# Patient Record
Sex: Female | Born: 1985 | Race: Black or African American | Hispanic: No | Marital: Single | Smoking: Never smoker
Health system: Southern US, Community
[De-identification: ages and names within clinical notes are randomized; demographics above are authoritative.]

## PROBLEM LIST (undated history)

## (undated) ENCOUNTER — Inpatient Hospital Stay (HOSPITAL_COMMUNITY): Payer: Self-pay

## (undated) DIAGNOSIS — D649 Anemia, unspecified: Secondary | ICD-10-CM

## (undated) DIAGNOSIS — Z789 Other specified health status: Secondary | ICD-10-CM

## (undated) DIAGNOSIS — Z9289 Personal history of other medical treatment: Secondary | ICD-10-CM

## (undated) DIAGNOSIS — N189 Chronic kidney disease, unspecified: Secondary | ICD-10-CM

## (undated) DIAGNOSIS — I5042 Chronic combined systolic (congestive) and diastolic (congestive) heart failure: Secondary | ICD-10-CM

## (undated) HISTORY — DX: Chronic combined systolic (congestive) and diastolic (congestive) heart failure: I50.42

## (undated) HISTORY — PX: NO PAST SURGERIES: SHX2092

---
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
## 2011-04-24 ENCOUNTER — Emergency Department (HOSPITAL_COMMUNITY)
Admission: EM | Admit: 2011-04-24 | Discharge: 2011-04-24 | Disposition: A | Discharge status: Home Health | Payer: Self-pay | Source: Home / Self Care | Attending: Emergency Medicine | Admitting: Emergency Medicine

## 2011-04-24 ENCOUNTER — Encounter (HOSPITAL_COMMUNITY): Payer: Self-pay | Admitting: Emergency Medicine

## 2011-04-24 DIAGNOSIS — N939 Abnormal uterine and vaginal bleeding, unspecified: Secondary | ICD-10-CM

## 2011-04-24 DIAGNOSIS — N898 Other specified noninflammatory disorders of vagina: Secondary | ICD-10-CM

## 2011-04-24 LAB — POCT URINALYSIS DIP (DEVICE)
Bilirubin Urine: NEGATIVE
Glucose, UA: NEGATIVE mg/dL
Hgb urine dipstick: NEGATIVE
Nitrite: NEGATIVE
Specific Gravity, Urine: <=1.005 (ref 1.005–1.030)
Urobilinogen, UA: 0.2 mg/dL (ref 0.0–1.0)
pH: 6.5 (ref 5.0–8.0)

## 2011-04-24 NOTE — ED Notes (Signed)
Delay in primary nurse assessment and treatment secondary to department acuity.

## 2011-04-24 NOTE — ED Provider Notes (Signed)
History     CSN: [id number]  Arrival date & time 04/24/11  4098   First MD Initiated Contact with Patient 04/24/11 [phone]      Chief Complaint  Patient presents with  . Vaginal Bleeding    (Consider location/radiation/quality/duration/timing/severity/associated sxs/prior treatment) HPI Comments: Been spotting on and off for two days my next period is not due yet, Im very regular my last period was 02-19, usually last  Days, not like this spotting" " last time was yesterday, No pain  Patient is a 26 y.o. female presenting with vaginal bleeding. The history is provided by the patient.  Vaginal Bleeding This is a new problem. The current episode started more than 2 days ago. The problem occurs rarely. The problem has been gradually improving. Pertinent negatives include no abdominal pain. She has tried nothing for the symptoms.    History reviewed. No pertinent past medical history.  History reviewed. No pertinent past surgical history.  No family history on file.  History  Substance Use Topics  . Smoking status: Never Smoker   . Smokeless tobacco: Not on file  . Alcohol Use: No    OB History    Grav Para Term Preterm Abortions TAB SAB Ect Mult Living                  Review of Systems  Constitutional: Negative for fever and appetite change.  Gastrointestinal: Negative for abdominal pain.  Genitourinary: Positive for vaginal bleeding and menstrual problem. Negative for dysuria, frequency, flank pain and vaginal pain.    Allergies  Review of patient's allergies indicates no known allergies.  Home Medications  No current outpatient prescriptions on file.  BP 125/71  Pulse 76  Temp(Src) 98.2 F (36.8 C) (Oral)  Resp 16  SpO2 100%  LMP 04/03/2011  Physical Exam  Nursing note and vitals reviewed. Constitutional: She appears well-developed and well-nourished.  Eyes: Conjunctivae are normal.  Genitourinary: Vagina normal. Uterus is not enlarged and not tender.  Cervix exhibits no motion tenderness and no discharge. Right adnexum displays no mass, no tenderness and no fullness. Left adnexum displays no mass, no tenderness and no fullness. No erythema, tenderness or bleeding around the vagina. No foreign body around the vagina. No vaginal discharge found.  Skin: No rash noted.    ED Course  Procedures (including critical care time)  Labs Reviewed  POCT URINALYSIS DIP (DEVICE) - Abnormal; Notable for the following:    Leukocytes, UA SMALL (*) Biochemical Testing Only. Please order routine urinalysis from main lab if confirmatory testing is needed.   All other components within normal limits  POCT PREGNANCY, URINE  GC/CHLAMYDIA PROBE AMP, GENITAL   No results found.   1. Vaginal bleeding, abnormal       MDM  Mild intermenstrual bleeding, with negative pregnancy test, and no pelvic pain        Jimmie Molly, MD 04/24/11 2047

## 2011-04-24 NOTE — ED Notes (Signed)
patientinstructed to undress and place on gown for physician evaluation.  Equipment at bedside

## 2011-04-24 NOTE — ED Notes (Signed)
Patient reports vaginal bleeding , onset 3/10.  Denies pain.  Last period was 2/19.  This episode of bleeding is "spotting" per patient.  No bleeding today.  Reports spotting yesterday am.

## 2011-04-24 NOTE — Discharge Instructions (Signed)
As discussed need to followup with the [facility] health Department GYN clinic.    Dysfunctional Uterine Bleeding Normally, menstrual periods begin between ages 47 to 22 in young women. A normal menstrual cycle/period may begin every 23 days up to 35 days and lasts from 1 to 7 days. Around 12 to 14 days before your menstrual period starts, ovulation (ovary produces an egg) occurs. When counting the time between menstrual periods, count from the first day of bleeding of the previous period to the first day of bleeding of the next period. Dysfunctional (abnormal) uterine bleeding is bleeding that is different from a normal menstrual period. Your periods may come earlier or later than usual. They may be lighter, have blood clots or be heavier. You may have bleeding between periods, or you may skip one period or more. You may have bleeding after sexual intercourse, bleeding after menopause, or no menstrual period. CAUSES   Pregnancy (normal, miscarriage, tubal).   IUDs (intrauterine device, birth control).   Birth control pills.   Hormone treatment.   Menopause.   Infection of the cervix.   Blood clotting problems.   Infection of the inside lining of the uterus.   Endometriosis, inside lining of the uterus growing in the pelvis and other female organs.   Adhesions (scar tissue) inside the uterus.   Obesity or severe weight loss.   Uterine polyps inside the uterus.   Cancer of the vagina, cervix, or uterus.   Ovarian cysts or polycystic ovary syndrome.   Medical problems (diabetes, thyroid disease).   Uterine fibroids (noncancerous tumor).   Problems with your female hormones.   Endometrial hyperplasia, very thick lining and enlarged cells inside of the uterus.   Medicines that interfere with ovulation.   Radiation to the pelvis or abdomen.   Chemotherapy.  DIAGNOSIS   Your doctor will discuss the history of your menstrual periods, medicines you are taking, changes in  your weight, stress in your life, and any medical problems you may have.   Your doctor will do a physical and pelvic examination.   Your doctor may want to perform certain tests to make a diagnosis, such as:   Pap test.   Blood tests.   Cultures for infection.   CT scan.   Ultrasound.   Hysteroscopy.   Laparoscopy.   MRI.   Hysterosalpingography.   D and C.   Endometrial biopsy.  TREATMENT  Treatment will depend on the cause of the dysfunctional uterine bleeding (DUB). Treatment may include:  Observing your menstrual periods for a couple of months.   Prescribing medicines for medical problems, including:   Antibiotics.   Hormones.   Birth control pills.   Removing an IUD (intrauterine device, birth control).   Surgery:   D and C (scrape and remove tissue from inside the uterus).   Laparoscopy (examine inside the abdomen with a lighted tube).   Uterine ablation (destroy lining of the uterus with electrical current, laser, heat, or freezing).   Hysteroscopy (examine cervix and uterus with a lighted tube).   Hysterectomy (remove the uterus).  HOME CARE INSTRUCTIONS   If medicines were prescribed, take exactly as directed. Do not change or switch medicines without consulting your caregiver.   Long term heavy bleeding may result in iron deficiency. Your caregiver may have prescribed iron pills. They help replace the iron that your body lost from heavy bleeding. Take exactly as directed.   Do not take aspirin or medicines that contain aspirin one week before  or during your menstrual period. Aspirin may make the bleeding worse.   If you need to change your sanitary pad or tampon more than once every 2 hours, stay in bed with your feet elevated and a cold pack on your lower abdomen. Rest as much as possible, until the bleeding stops or slows down.   Eat well-balanced meals. Eat foods high in iron. Examples are:   Leafy green vegetables.   Whole-grain breads  and cereals.   Eggs.   Meat.   Liver.   Do not try to lose weight until the abnormal bleeding has stopped and your blood iron level is back to normal. Do not lift more than ten pounds or do strenuous activities when you are bleeding.   For a couple of months, make note on your calendar, marking the start and ending of your period, and the type of bleeding (light, medium, heavy, spotting, clots or missed periods). This is for your caregiver to better evaluate your problem.  SEEK MEDICAL CARE IF:   You develop nausea (feeling sick to your stomach) and vomiting, dizziness, or diarrhea while you are taking your medicine.   You are getting lightheaded or weak.   You have any problems that may be related to the medicine you are taking.   You develop pain with your DUB.   You want to remove your IUD.   You want to stop or change your birth control pills or hormones.   You have any type of abnormal bleeding mentioned above.   You are over 76 years old and have not had a menstrual period yet.   You are 26 years old and you are still having menstrual periods.   You have any of the symptoms mentioned above.   You develop a rash.  SEEK IMMEDIATE MEDICAL CARE IF:   An oral temperature above 102 F (38.9 C) develops.   You develop chills.   You are changing your sanitary pad or tampon more than once an hour.   You develop abdominal pain.   You pass out or faint.  Document Released: 01/27/2000 Document Revised: 01/18/2011 Document Reviewed: 12/28/2008 [facility] Patient Information 2012 [place], [place].

## 2011-04-24 NOTE — ED Notes (Signed)
Patient in restroom obtaining specimen

## 2011-04-25 LAB — GC/CHLAMYDIA PROBE AMP, GENITAL
Chlamydia, DNA Probe: NEGATIVE
GC Probe Amp, Genital: NEGATIVE

## 2013-06-05 ENCOUNTER — Emergency Department (HOSPITAL_COMMUNITY)
Admission: EM | Admit: 2013-06-05 | Discharge: 2013-06-05 | Disposition: A | Discharge status: Home / Self Care | Payer: Self-pay | Attending: Emergency Medicine | Admitting: Emergency Medicine

## 2013-06-05 ENCOUNTER — Encounter (HOSPITAL_COMMUNITY): Payer: Self-pay | Admitting: Emergency Medicine

## 2013-06-05 DIAGNOSIS — J020 Streptococcal pharyngitis: Secondary | ICD-10-CM | POA: Insufficient documentation

## 2013-06-05 LAB — RAPID STREP SCREEN (MED CTR MEBANE ONLY): STREPTOCOCCUS, GROUP A SCREEN (DIRECT): POSITIVE — AB

## 2013-06-05 MED ORDER — PENICILLIN G BENZATHINE 1200000 UNIT/2ML IM SUSP
1.2000 10*6.[IU] | Freq: Once | INTRAMUSCULAR | Status: AC
  Administered 2013-06-05: 1.2 10*6.[IU] via INTRAMUSCULAR
  Filled 2013-06-05: qty 2

## 2013-06-05 NOTE — ED Notes (Signed)
Pt. reports throat swelling with mild pain onset yesterday , airway intact / respirations unlabored.

## 2013-06-05 NOTE — ED Provider Notes (Signed)
CSN: [id number]     Arrival date & time 06/05/13  2017 History  This chart was scribed for non-physician practitioner, Elpidio AnisShari Jeramine Delis, PA-C working with Audree CamelScott T Goldston, MD by Greggory StallionKayla Andersen, ED scribe. This patient was seen in room TR07C/TR07C and the patient's care was started at 8:44 PM.   Chief Complaint  Patient presents with  . Sore Throat   The history is provided by the patient. No language interpreter was used.   HPI Comments: Carolyn Lynn is a 28 y.o. female who presents to the Emergency Department complaining of gradual onset sore throat that started last night. Swallowing worsens the pain. Denies fever, congestion, rhinorrhea, cough.  History reviewed. No pertinent past medical history. History reviewed. No pertinent past surgical history. No family history on file. History  Substance Use Topics  . Smoking status: Never Smoker   . Smokeless tobacco: Not on file  . Alcohol Use: No   OB History   Grav Para Term Preterm Abortions TAB SAB Ect Mult Living                 Review of Systems  Constitutional: Negative for fever.  HENT: Positive for sore throat. Negative for congestion and rhinorrhea.   Respiratory: Negative for cough.   All other systems reviewed and are negative.  Allergies  Review of patient's allergies indicates no known allergies.  Home Medications   Prior to Admission medications   Not on File   BP 124/85  Pulse 96  Temp(Src) 98.6 F (37 C) (Oral)  Resp 18  SpO2 100%  LMP 05/19/2013  Physical Exam  Nursing note and vitals reviewed. Constitutional: She is oriented to person, place, and time. She appears well-developed and well-nourished. No distress.  HENT:  Head: Normocephalic and atraumatic.  Red, swollen tonsils bilaterally with exudate. Uvula midline.   Eyes: EOM are normal.  Neck: Neck supple. No tracheal deviation present.  Cardiovascular: Normal rate.   Pulmonary/Chest: Effort normal. No respiratory distress. She has no  wheezes. She has no rales.  Musculoskeletal: Normal range of motion.  Lymphadenopathy:    She has cervical adenopathy.       Right cervical: Superficial cervical adenopathy present.       Left cervical: Superficial cervical adenopathy present.  Neurological: She is alert and oriented to person, place, and time.  Skin: Skin is warm and dry.  Psychiatric: She has a normal mood and affect. Her behavior is normal.    ED Course  Procedures (including critical care time)  DIAGNOSTIC STUDIES: Oxygen Saturation is 100% on RA, normal by my interpretation.    COORDINATION OF CARE: 8:45 PM-Discussed treatment plan which includes rapid strep with pt at bedside and pt agreed to plan.   Labs Review Labs Reviewed  RAPID STREP SCREEN - Abnormal; Notable for the following:    Streptococcus, Group A Screen (Direct) POSITIVE (*)    All other components within normal limits    Imaging Review No results found.   EKG Interpretation None      MDM   Final diagnoses:  None    1. Strep throat  Patient well appearing, afebrile. Symptoms isolated to sore throat and there is no difficulty swallowing associated with it. Positive strep treated with Bicillin. Patient stable for discharge.   I personally performed the services described in this documentation, which was scribed in my presence. The recorded information has been reviewed and is accurate.  Arnoldo HookerShari A Ferne Ellingwood, PA-C 06/05/13 2150

## 2013-06-05 NOTE — Discharge Instructions (Signed)
Salt Water Gargle °This solution will help make your mouth and throat feel better. °HOME CARE INSTRUCTIONS  °· Mix 1 teaspoon of salt in 8 ounces of warm water. °· Gargle with this solution as much or often as you need or as directed. Swish and gargle gently if you have any sores or wounds in your mouth. °· Do not swallow this mixture. °Document Released: 11/03/2003 Document Revised: 04/23/2011 Document Reviewed: 03/26/2008 °ExitCare® Patient Information ©2014 [employer]. °Strep Throat °Strep throat is an infection of the throat caused by a bacteria named Streptococcus pyogenes. Your caregiver may call the infection streptococcal "tonsillitis" or "pharyngitis" depending on whether there are signs of inflammation in the tonsils or back of the throat. Strep throat is most common in children aged 5 15 years during the cold months of the year, but it can occur in people of any age during any season. This infection is spread from person to person (contagious) through coughing, sneezing, or other close contact. °SYMPTOMS  °· Fever or chills. °· Painful, swollen, red tonsils or throat. °· Pain or difficulty when swallowing. °· White or yellow spots on the tonsils or throat. °· Swollen, tender lymph nodes or "glands" of the neck or under the jaw. °· Red rash all over the body (rare). °DIAGNOSIS  °Many different infections can cause the same symptoms. A test must be done to confirm the diagnosis so the right treatment can be given. A "rapid strep test" can help your caregiver make the diagnosis in a few minutes. If this test is not available, a light swab of the infected area can be used for a throat culture test. If a throat culture test is done, results are usually available in a day or two. °TREATMENT  °Strep throat is treated with antibiotic medicine. °HOME CARE INSTRUCTIONS  °· Gargle with 1 tsp of salt in 1 cup of warm water, 3 4 times per day or as needed for comfort. °· Family members who also have a sore throat  or fever should be tested for strep throat and treated with antibiotics if they have the strep infection. °· Make sure everyone in your household washes their hands well. °· Do not share food, drinking cups, or personal items that could cause the infection to spread to others. °· You may need to eat a soft food diet until your sore throat gets better. °· Drink enough water and fluids to keep your urine clear or pale yellow. This will help prevent dehydration. °· Get plenty of rest. °· Stay home from school, daycare, or work until you have been on antibiotics for 24 hours. °· Only take over-the-counter or prescription medicines for pain, discomfort, or fever as directed by your caregiver. °· If antibiotics are prescribed, take them as directed. Finish them even if you start to feel better. °SEEK MEDICAL CARE IF:  °· The glands in your neck continue to enlarge. °· You develop a rash, cough, or earache. °· You cough up green, yellow-brown, or bloody sputum. °· You have pain or discomfort not controlled by medicines. °· Your problems seem to be getting worse rather than better. °SEEK IMMEDIATE MEDICAL CARE IF:  °· You develop any new symptoms such as vomiting, severe headache, stiff or painful neck, chest pain, shortness of breath, or trouble swallowing. °· You develop severe throat pain, drooling, or changes in your voice. °· You develop swelling of the neck, or the skin on the neck becomes red and tender. °· You have a fever. °·   You develop signs of dehydration, such as fatigue, dry mouth, and decreased urination. °· You become increasingly sleepy, or you cannot wake up completely. °Document Released: 01/27/2000 Document Revised: 01/16/2012 Document Reviewed: 03/30/2010 °ExitCare® Patient Information ©2014 [employer]. ° °

## 2013-06-08 NOTE — ED Provider Notes (Signed)
Medical screening examination/treatment/procedure(s) were performed by non-physician practitioner and as supervising physician I was immediately available for consultation/collaboration.   EKG Interpretation None        Audree CamelScott T Bailee Metter, MD 06/08/13 [phone]

## 2013-08-04 ENCOUNTER — Encounter (HOSPITAL_COMMUNITY): Payer: Self-pay | Admitting: Emergency Medicine

## 2013-08-04 ENCOUNTER — Emergency Department (HOSPITAL_COMMUNITY)
Admission: EM | Admit: 2013-08-04 | Discharge: 2013-08-04 | Disposition: A | Discharge status: Home / Self Care | Payer: Self-pay | Attending: Emergency Medicine | Admitting: Emergency Medicine

## 2013-08-04 DIAGNOSIS — K029 Dental caries, unspecified: Secondary | ICD-10-CM | POA: Insufficient documentation

## 2013-08-04 DIAGNOSIS — Z791 Long term (current) use of non-steroidal anti-inflammatories (NSAID): Secondary | ICD-10-CM | POA: Insufficient documentation

## 2013-08-04 MED ORDER — TRAMADOL HCL 50 MG PO TABS: 50.0000 mg | ORAL_TABLET | Freq: Four times a day (QID) | ORAL | Status: DC | PRN

## 2013-08-04 MED ORDER — NAPROXEN 500 MG PO TABS: 500.0000 mg | ORAL_TABLET | Freq: Two times a day (BID) | ORAL | Status: DC

## 2013-08-04 MED ORDER — PENICILLIN V POTASSIUM 500 MG PO TABS: 500.0000 mg | ORAL_TABLET | Freq: Four times a day (QID) | ORAL | Status: DC

## 2013-08-04 NOTE — ED Provider Notes (Signed)
CSN: [id number]     Arrival date & time 08/04/13  2138 History   First MD Initiated Contact with Patient 08/04/13 2201     This chart was scribed for non-physician practitioner, Junius FinnerErin O'Malley PA-C,working with Lyanne CoKevin M Campos, MD by Arlan OrganAshley Leger, ED Scribe. This patient was seen in room TR10C/TR10C and the patient's care was started at 10:45 PM.   Chief Complaint  Patient presents with  . Dental Pain  . Facial Pain   The history is provided by the patient. No language interpreter was used.    HPI Comments: Carolyn Lynn is a 28 y.o. female who presents to the Emergency Department complaining of constant, moderate L sided dental pain that radiates into the L side of face x 2 days. She also reports a HA onset yesterday. She has tried OTC Ibuprofen without any improvement. No fever, nausea, inability swallowing or vomiting. She denies any known allergies to medications. She has no pertinent past medical history. No other concerns this visit.  History reviewed. No pertinent past medical history. History reviewed. No pertinent past surgical history. History reviewed. No pertinent family history. History  Substance Use Topics  . Smoking status: Never Smoker   . Smokeless tobacco: Not on file  . Alcohol Use: No   OB History   Grav Para Term Preterm Abortions TAB SAB Ect Mult Living                 Review of Systems  Constitutional: Negative for fever and chills.  HENT: Positive for dental problem. Negative for congestion, ear pain and trouble swallowing.   Eyes: Negative for redness.  Respiratory: Negative for cough.   Skin: Negative for rash.  Psychiatric/Behavioral: Negative for confusion.      Allergies  Review of patient's allergies indicates no known allergies.  Home Medications   Prior to Admission medications   Medication Sig Start Date End Date Taking? Authorizing [name]  ibuprofen (ADVIL,MOTRIN) 200 MG tablet Take 400 mg by mouth every 6 (six) hours as needed.    Yes Historical Nyaisha Simao, MD  naproxen (NAPROSYN) 500 MG tablet Take 1 tablet (500 mg total) by mouth 2 (two) times daily. 08/04/13   Junius FinnerErin O'Malley, PA-C  penicillin v potassium (VEETID) 500 MG tablet Take 1 tablet (500 mg total) by mouth 4 (four) times daily. 08/04/13   Junius FinnerErin O'Malley, PA-C  traMADol (ULTRAM) 50 MG tablet Take 1 tablet (50 mg total) by mouth every 6 (six) hours as needed. 08/04/13   Junius FinnerErin O'Malley, PA-C   Triage Vitals: BP 129/85  Pulse 69  Temp(Src) 97.8 F (36.6 C) (Oral)  Resp 16  Ht 5\' 5"  (1.651 m)  Wt 125 lb 8 oz (56.926 kg)  BMI 20.88 kg/m2  SpO2 100%   Physical Exam  Nursing note and vitals reviewed. Constitutional: She is oriented to person, place, and time. She appears well-developed and well-nourished.  HENT:  Head: Normocephalic and atraumatic.  Diffuse dental caries without gingival abscess.  Tenderness along gingiva surrounding left upper teeth.  No discharge or bleeding.    Eyes: EOM are normal. Pupils are equal, round, and reactive to light.  Neck: Normal range of motion. Neck supple.  Cardiovascular: Normal rate.   Pulmonary/Chest: Effort normal.  Musculoskeletal: Normal range of motion.  Neurological: She is alert and oriented to person, place, and time. GCS eye subscore is 4. GCS verbal subscore is 5. GCS motor subscore is 6.  Skin: Skin is warm and dry.  Psychiatric: She has a normal  mood and affect. Her behavior is normal.    ED Course  Procedures (including critical care time)  DIAGNOSTIC STUDIES: Oxygen Saturation is 100% on RA, Normal by my interpretation.    COORDINATION OF CARE: 10:46 PM- Will give Tramadol, Naproxen, Penicillin, to help manage symptoms. Discussed treatment plan with pt at bedside and pt agreed to plan.     Labs Review Labs Reviewed - No data to display  Imaging Review No results found.   EKG Interpretation None      MDM   Final diagnoses:  Pain due to dental caries    Pt c/o dental pain radiating into left  side of face causing headache since yesterday. No relief with OTC pain meds.  No dental abscess on exam. Dental caries present.  Will tx with PCN, tramadol, and naproxen. Advised to f/u with dentist. Dental resource guide provided. Return precautions provided. Pt verbalized understanding and agreement with tx plan.   I personally performed the services described in this documentation, which was scribed in my presence. The recorded information has been reviewed and is accurate.    Junius Finnerrin O'Malley, PA-C 08/04/13 2326

## 2013-08-04 NOTE — Discharge Instructions (Signed)
Dental Care and Dentist Visits °Dental care supports good overall health. Regular dental visits can also help you avoid dental pain, bleeding, infection, and other more serious health problems in the future. It is important to keep the mouth healthy because diseases in the teeth, gums, and other oral tissues can spread to other areas of the body. Some problems, such as diabetes, heart disease, and pre-term labor have been associated with poor oral health.  °See your dentist every 6 months. If you experience emergency problems such as a toothache or broken tooth, go to the dentist right away. If you see your dentist regularly, you may catch problems early. It is easier to be treated for problems in the early stages.  °WHAT TO EXPECT AT A DENTIST VISIT  °Your dentist will look for many common oral health problems and recommend proper treatment. At your regular dental visit, you can expect: °· Gentle cleaning of the teeth and gums. This includes scraping and polishing. This helps to remove the sticky substance around the teeth and gums (plaque). Plaque forms in the mouth shortly after eating. Over time, plaque hardens on the teeth as tartar. If tartar is not removed regularly, it can cause problems. Cleaning also helps remove stains. °· Periodic X-rays. These pictures of the teeth and supporting bone will help your dentist assess the health of your teeth. °· Periodic fluoride treatments. Fluoride is a natural mineral shown to help strengthen teeth. Fluoride treatment involves applying a fluoride gel or varnish to the teeth. It is most commonly done in children. °· Examination of the mouth, tongue, jaws, teeth, and gums to look for any oral health problems, such as: °· Cavities (dental caries). This is decay on the tooth caused by plaque, sugar, and acid in the mouth. It is best to catch a cavity when it is small. °· Inflammation of the gums caused by plaque buildup (gingivitis). °· Problems with the mouth or malformed  or misaligned teeth. °· Oral cancer or other diseases of the soft tissues or jaws.  °KEEP YOUR TEETH AND GUMS HEALTHY °For healthy teeth and gums, follow these general guidelines as well as your dentist's specific advice: °· Have your teeth professionally cleaned at the dentist every 6 months. °· Brush twice daily with a fluoride toothpaste. °· Floss your teeth daily.  °· Ask your dentist if you need fluoride supplements, treatments, or fluoride toothpaste. °· Eat a healthy diet. Reduce foods and drinks with added sugar. °· Avoid smoking. °TREATMENT FOR ORAL HEALTH PROBLEMS °If you have oral health problems, treatment varies depending on the conditions present in your teeth and gums. °· Your caregiver will most likely recommend good oral hygiene at each visit. °· For cavities, gingivitis, or other oral health disease, your caregiver will perform a procedure to treat the problem. This is typically done at a separate appointment. Sometimes your caregiver will refer you to another dental specialist for specific tooth problems or for surgery. °SEEK IMMEDIATE DENTAL CARE IF: °· You have pain, bleeding, or soreness in the gum, tooth, jaw, or mouth area. °· A permanent tooth becomes loose or separated from the gum socket. °· You experience a blow or injury to the mouth or jaw area. °Document Released: 10/11/2010 Document Revised: 04/23/2011 Document Reviewed: 10/11/2010 °ExitCare® Patient Information ©2015 ExitCare, LLC. This information is not intended to replace advice given to you by your health care provider. Make sure you discuss any questions you have with your health care provider. ° °Dental Caries °Dental caries is tooth decay. This   decay can cause a hole in teeth (cavity) that can get bigger and deeper over time. HOME CARE  Brush and floss your teeth. Do this at least two times a day.  Use a fluoride toothpaste.  Use a mouth rinse if told by your dentist or doctor.  Eat less sugary and starchy foods.  Drink less sugary drinks.  Avoid snacking often on sugary and starchy foods. Avoid sipping often on sugary drinks.  Keep regular checkups and cleanings with your dentist.  Use fluoride supplements if told by your dentist or doctor.  Allow fluoride to be applied to teeth if told by your dentist or doctor. MAKE SURE YOU:  Understand these instructions.  Will watch your condition.  Will get help right away if you are not doing well or get worse. Document Released: 11/08/2007 Document Revised: 10/01/2012 Document Reviewed: 02/01/2012 [facility] Patient Information 2015 [place], [place]. This information is not intended to replace advice given to you by your health care provider. Make sure you discuss any questions you have with your health care provider. Dental Pain Toothache is pain in or around a tooth. It may get worse with chewing or with cold or heat.  HOME CARE  Your dentist may use a numbing medicine during treatment. If so, you may need to avoid eating until the medicine wears off. Ask your dentist about this.  Only take medicine as told by your dentist or doctor.  Avoid chewing food near the painful tooth until after all treatment is done. Ask your dentist about this. GET HELP RIGHT AWAY IF:   The problem gets worse or new problems appear.  You have a fever.  There is redness and puffiness (swelling) of the face, jaw, or neck.  You cannot open your mouth.  There is pain in the jaw.  There is very bad pain that is not helped by medicine. MAKE SURE YOU:   Understand these instructions.  Will watch your condition.  Will get help right away if you are not doing well or get worse. Document Released: 07/18/2007 Document Revised: 04/23/2011 Document Reviewed: 07/18/2007 [facility] Patient Information 2015 [place]Care, [place]. This information is not intended to replace advice given to you by your health care provider. Make sure you discuss any questions you have with your health  care provider. Emergency Department Resource Guide 1) Find a Doctor and Pay Out of Pocket Although you won't have to find out who is covered by your insurance plan, it is a good idea to ask around and get recommendations. You will then need to call the office and see if the doctor you have chosen will accept you as a new patient and what types of options they offer for patients who are self-pay. Some doctors offer discounts or will set up payment plans for their patients who do not have insurance, but you will need to ask so you aren't surprised when you get to your appointment.  2) Contact Your Local Health Department Not all health departments have doctors that can see patients for sick visits, but many do, so it is worth a call to see if yours does. If you don't know where your local health department is, you can check in your phone book. The CDC also has a tool to help you locate your state's health department, and many state websites also have listings of all of their local health departments.  3) Find a Walk-in Clinic If your illness is not likely to be very severe or complicated, you  may want to try a walk in clinic. These are popping up all over the country in pharmacies, drugstores, and shopping centers. They're usually staffed by nurse practitioners or physician assistants that have been trained to treat common illnesses and complaints. They're usually fairly quick and inexpensive. However, if you have serious medical issues or chronic medical problems, these are probably not your best option.  No Primary Care Doctor: - Call Health Connect at  [phone] - they can help you locate a primary care doctor that  accepts your insurance, provides certain services, etc. - Physician Referral Service- [phone]  Chronic Pain Problems: Organization         Address  Phone   Notes  [facility]  [phone] Patients need to be referred by their primary care doctor.    Medication Assistance: Organization         Address  Phone   Notes  [facility] Medication [facility] [address] [place], [place] [address] [phone] --Must be a resident of [facility] -- Must have NO insurance coverage whatsoever (no Medicaid/ Medicare, etc.) -- The pt. MUST have a primary care doctor that directs their care regularly and follows them in the community   [facility]  [phone]   [employer]  [phone]    Agencies that provide inexpensive medical care: Organization         Address                                                       Phone                                                                            Notes  [facility]  [phone]   [facility]    [phone]   [facility] [address] [place], [place] [address] [phone]   [facility] [address] [place] [place] [phone]   Planned Parenthood    [phone]   [facility]    [phone]   Community Health and [facility]  [address], [place] Phone:  [phone], Fax:  [phone] Hours of Operation:  9 am - 6 pm, M-F.  Also accepts Medicaid/Medicare and self-pay.  [facility]  [address], [place] Phone: [phone], Fax: [phone]. Hours of Operation:  8:30 am - 5:30 pm, M-F.  Also accepts Medicaid and self-pay.  [facility] [place] [address] [place] Phone: [phone]   [facility] [address] Natasha Bence [place], [place] [phone], Ext. [phone] Mondays & Thursdays: 7-9 AM.  First 15 patients are seen on a first come, first serve basis.    Medicaid-accepting [facility] Providers:  Organization         Address  Phone                               Notes  [facility] [address], [place] [phone]) [phone] Also accepts self-pay patients.  [facility] [address], [place]  [phone]   [facility] [address], [place] [phone]   [facility] [address] [place] [address], [place] [phone]   Renaye RakersVeita Bland [address], [place]   [phone] Only accepts [place] Access [place] patients after they have their name applied to their card.   Self-Pay (no insurance) in [facility] [place]:   Organization         Address                                                     Phone               Notes  Sickle Cell Patients, [facility] [address], [place] [phone]   [facility] [address], [place] [phone]   [facility] [place]  [address], [place] [phone]   [facility]/Dr. Osei-Bonsu  [address] [place] [address], [place] or [phone], [place] [phone] Phone number for both [place] and [place] locations is the same.  [facility] and [facility] [address], [place] [phone]   [facility] [place] [address] [place] [address], [place] or [address] [phone]   [facility] [address], [place] [phone], phone; [phone], fax Sees patients 1st and 3rd Saturday of every month.  Must not qualify for public or private insurance (i.e. Medicaid, Medicare, [place] Health Choice, Veterans' Benefits)  Household income should be no more than 200% of the poverty level The clinic cannot treat you if you are pregnant or think you are pregnant  Sexually transmitted diseases are not treated at the clinic.    Dental Care: Organization         Address                                  Phone                       Notes  [facility] of  [facility] [address], [place] [phone] Accepts children up to age 27 who are enrolled in [place] or [place] Health Choice; pregnant women with a Medicaid card; and children who have applied for Medicaid or [place] Health Choice, but were declined, whose parents can pay a reduced fee at time of service.  [facility] [place]  [address], [place] [phone]) [phone] Accepts children up to age 27 who are enrolled in [place] or [place] Health Choice; pregnant women with a Medicaid card; and children who have applied for Medicaid or  Health Choice, but were declined, whose parents can pay a reduced fee at time  of service.  [facility]  [address], [place] [phone] Patients are seen by appointment only. Walk-ins are not accepted. [facility] will see patients 27 years of age and older. Monday - Tuesday (8am-5pm) Most Wednesdays (8:30-5pm) $30 per visit, cash only  [facility]  [address], [facility] [place] [phone] Patients are seen by appointment only. Walk-ins are not accepted. [facility] will see patients 318 years of age and older. One Wednesday Evening (Monthly: Volunteer Based).  $30 per visit, cash only  [facility]  [phone] for adults; Children under age 744, call Graduate Pediatric Dentistry at [phone]. Children aged 784-14, please call [phone] to request a pediatric application.  Dental services are provided in all areas of dental care including fillings, crowns and bridges, complete and partial dentures, implants, gum treatment, root canals, and extractions. Preventive care is also provided. Treatment is provided to both adults and children. Patients are selected via a lottery and there is often a waiting list.   [facility] [address], [place]  [phone] [email or web address]    [facility] [address], [place], [place] [phone], Ext. [phone] Second and Fourth Thursday of each month, opens at 6:30 AM; Clinic ends at 9 AM.  Patients are seen on a first-come first-served basis, and a limited number are seen during each clinic.   [facility]  [address], [place], [place] [phone]) [phone]   Eligibility Requirements You must have lived in [place], [place], or [place] for at least the last three months.   You cannot be eligible for state or federal sponsored [place] Cityhealthcare insurance, including CIGNAVeterans Administration, [place]icaid, or [employer]Medicare.   You generally cannot be eligible for healthcare insurance through your employer.    How to apply: Eligibility screenings are held every Tuesday and Wednesday afternoon from 1:00 pm until 4:00 pm. You do not need an appointment for the interview!  [facility] [address], [place], [place] [phone]   [facility]  [phone]   [facility]  [phone]   [facility]  [phone]

## 2013-08-04 NOTE — ED Provider Notes (Signed)
Medical screening examination/treatment/procedure(s) were performed by non-physician practitioner and as supervising physician I was immediately available for consultation/collaboration.   EKG Interpretation None        Kevin M Campos, MD 08/04/13 2356 

## 2013-08-04 NOTE — ED Notes (Signed)
Pt states dental pain that is radiating to her left side of face and causing HA and facial pain. Pt states pain for 2 days now.

## 2013-08-04 NOTE — ED Notes (Signed)
PT ambulated with baseline gait; VSS; A&Ox3; no signs of distress; respirations even and unlabored; skin warm and dry; no questions upon discharge.  

## 2015-02-13 NOTE — L&D Delivery Note (Signed)
Delivery Note At 11:06 AM a viable female was delivered via Vaginal, Spontaneous Delivery (Presentation:vertex ;ROA  ).  APGAR: 8, 9; weight  .   Placenta status:spontaneous ,shultz .  Cord:  with the following complications: 3VC.  Cord pH: n/a  Anesthesia:  Epidural Episiotomy: None Lacerations: 2nd degree Suture Repair: 2.0 vicryl Est. Blood Loss (mL):  200  Mom to postpartum.  Baby to Couplet care / Skin to Skin.  Carolyn Lynn 11/13/2015, 11:39 AM

## 2015-03-30 ENCOUNTER — Encounter (INDEPENDENT_AMBULATORY_CARE_PROVIDER_SITE_OTHER): Payer: Self-pay

## 2015-03-30 LAB — POCT PREGNANCY, URINE: PREG TEST UR: POSITIVE — AB

## 2015-03-30 LAB — POCT URINALYSIS DIP (DEVICE)
BILIRUBIN URINE: NEGATIVE
GLUCOSE, UA: NEGATIVE mg/dL
Hgb urine dipstick: NEGATIVE
KETONES UR: NEGATIVE mg/dL
Nitrite: NEGATIVE
Protein, ur: NEGATIVE mg/dL
SPECIFIC GRAVITY, URINE: 1.02 (ref 1.005–1.030)
Urobilinogen, UA: 1 mg/dL (ref 0.0–1.0)
pH: 7.5 (ref 5.0–8.0)

## 2015-04-28 LAB — OB RESULTS CONSOLE HIV ANTIBODY (ROUTINE TESTING): HIV: NONREACTIVE

## 2015-04-28 LAB — OB RESULTS CONSOLE ABO/RH: RH TYPE: POSITIVE

## 2015-04-28 LAB — OB RESULTS CONSOLE GC/CHLAMYDIA
Chlamydia: NEGATIVE
Gonorrhea: NEGATIVE

## 2015-04-28 LAB — OB RESULTS CONSOLE HEPATITIS B SURFACE ANTIGEN: Hepatitis B Surface Ag: NEGATIVE

## 2015-04-28 LAB — OB RESULTS CONSOLE ANTIBODY SCREEN: Antibody Screen: NEGATIVE

## 2015-04-28 LAB — OB RESULTS CONSOLE RPR: RPR: NONREACTIVE

## 2015-04-28 LAB — OB RESULTS CONSOLE RUBELLA ANTIBODY, IGM: Rubella: IMMUNE

## 2015-04-29 ENCOUNTER — Other Ambulatory Visit (HOSPITAL_COMMUNITY): Payer: Self-pay | Admitting: Nurse Practitioner

## 2015-04-29 DIAGNOSIS — Z3A13 13 weeks gestation of pregnancy: Secondary | ICD-10-CM

## 2015-04-29 DIAGNOSIS — Z3682 Encounter for antenatal screening for nuchal translucency: Secondary | ICD-10-CM

## 2015-05-03 ENCOUNTER — Other Ambulatory Visit (HOSPITAL_COMMUNITY): Payer: Self-pay | Admitting: Nurse Practitioner

## 2015-05-03 ENCOUNTER — Encounter (HOSPITAL_COMMUNITY): Payer: Self-pay

## 2015-05-03 ENCOUNTER — Ambulatory Visit (HOSPITAL_COMMUNITY)
Admission: RE | Admit: 2015-05-03 | Discharge: 2015-05-03 | Disposition: A | Discharge status: Home / Self Care | Payer: Medicaid Other | Source: Ambulatory Visit | Attending: Nurse Practitioner | Admitting: Nurse Practitioner

## 2015-05-03 DIAGNOSIS — Z3682 Encounter for antenatal screening for nuchal translucency: Secondary | ICD-10-CM

## 2015-05-03 DIAGNOSIS — Z3A11 11 weeks gestation of pregnancy: Secondary | ICD-10-CM | POA: Insufficient documentation

## 2015-05-03 DIAGNOSIS — O3481 Maternal care for other abnormalities of pelvic organs, first trimester: Secondary | ICD-10-CM

## 2015-05-03 DIAGNOSIS — Z3A13 13 weeks gestation of pregnancy: Secondary | ICD-10-CM

## 2015-05-03 DIAGNOSIS — Z36 Encounter for antenatal screening of mother: Secondary | ICD-10-CM | POA: Diagnosis not present

## 2015-05-03 DIAGNOSIS — N83209 Unspecified ovarian cyst, unspecified side: Secondary | ICD-10-CM

## 2015-05-09 ENCOUNTER — Other Ambulatory Visit (HOSPITAL_COMMUNITY): Payer: Self-pay

## 2015-05-10 ENCOUNTER — Other Ambulatory Visit: Payer: Self-pay | Admitting: Obstetrics & Gynecology

## 2015-05-10 DIAGNOSIS — R9389 Abnormal findings on diagnostic imaging of other specified body structures: Secondary | ICD-10-CM

## 2015-05-10 DIAGNOSIS — Z3A18 18 weeks gestation of pregnancy: Secondary | ICD-10-CM

## 2015-05-10 DIAGNOSIS — Z3689 Encounter for other specified antenatal screening: Secondary | ICD-10-CM

## 2015-06-27 ENCOUNTER — Ambulatory Visit (HOSPITAL_COMMUNITY)
Admission: RE | Admit: 2015-06-27 | Discharge: 2015-06-27 | Disposition: A | Discharge status: Home / Self Care | Payer: Medicaid Other | Source: Ambulatory Visit | Attending: Obstetrics & Gynecology | Admitting: Obstetrics & Gynecology

## 2015-06-27 ENCOUNTER — Encounter (HOSPITAL_COMMUNITY): Payer: Self-pay

## 2015-06-27 ENCOUNTER — Other Ambulatory Visit: Payer: Self-pay | Admitting: Obstetrics & Gynecology

## 2015-06-27 DIAGNOSIS — Z3A19 19 weeks gestation of pregnancy: Secondary | ICD-10-CM | POA: Diagnosis not present

## 2015-06-27 DIAGNOSIS — Z3689 Encounter for other specified antenatal screening: Secondary | ICD-10-CM

## 2015-06-27 DIAGNOSIS — R9389 Abnormal findings on diagnostic imaging of other specified body structures: Secondary | ICD-10-CM

## 2015-06-27 DIAGNOSIS — Z3A18 18 weeks gestation of pregnancy: Secondary | ICD-10-CM

## 2015-06-27 DIAGNOSIS — Z36 Encounter for antenatal screening of mother: Secondary | ICD-10-CM | POA: Insufficient documentation

## 2015-10-23 ENCOUNTER — Encounter (HOSPITAL_COMMUNITY): Payer: Self-pay | Admitting: Certified Nurse Midwife

## 2015-10-23 ENCOUNTER — Inpatient Hospital Stay (HOSPITAL_COMMUNITY)
Admission: AD | Admit: 2015-10-23 | Discharge: 2015-10-23 | Disposition: A | Discharge status: Home / Self Care | Payer: Medicaid Other | Source: Ambulatory Visit | Attending: Obstetrics and Gynecology | Admitting: Obstetrics and Gynecology

## 2015-10-23 DIAGNOSIS — O4703 False labor before 37 completed weeks of gestation, third trimester: Secondary | ICD-10-CM

## 2015-10-23 DIAGNOSIS — Z3493 Encounter for supervision of normal pregnancy, unspecified, third trimester: Secondary | ICD-10-CM

## 2015-10-23 DIAGNOSIS — Z3A35 35 weeks gestation of pregnancy: Secondary | ICD-10-CM

## 2015-10-23 DIAGNOSIS — Z3689 Encounter for other specified antenatal screening: Secondary | ICD-10-CM

## 2015-10-23 DIAGNOSIS — O26893 Other specified pregnancy related conditions, third trimester: Secondary | ICD-10-CM | POA: Diagnosis present

## 2015-10-23 DIAGNOSIS — R109 Unspecified abdominal pain: Secondary | ICD-10-CM | POA: Diagnosis present

## 2015-10-23 HISTORY — DX: Other specified health status: Z78.9

## 2015-10-23 LAB — URINALYSIS, ROUTINE W REFLEX MICROSCOPIC
BILIRUBIN URINE: NEGATIVE
Glucose, UA: NEGATIVE mg/dL
HGB URINE DIPSTICK: NEGATIVE
Ketones, ur: 15 mg/dL — AB
Leukocytes, UA: NEGATIVE
NITRITE: NEGATIVE
PROTEIN: NEGATIVE mg/dL
Specific Gravity, Urine: 1.01 (ref 1.005–1.030)
pH: >9 — ABNORMAL HIGH (ref 5.0–8.0)

## 2015-10-23 MED ORDER — ACETAMINOPHEN 500 MG PO TABS
1000.0000 mg | ORAL_TABLET | Freq: Four times a day (QID) | ORAL | Status: DC | PRN
  Administered 2015-10-23: 1000 mg via ORAL
  Filled 2015-10-23: qty 2

## 2015-10-23 NOTE — Discharge Instructions (Signed)
Third Trimester of Pregnancy °The third trimester is from week 29 through week 42, months 7 through 9. The third trimester is a time when the fetus is growing rapidly. At the end of the ninth month, the fetus is about 20 inches in length and weighs 6-10 pounds.  °BODY CHANGES °Your body goes through many changes during pregnancy. The changes vary from woman to woman.  °· Your weight will continue to increase. You can expect to gain 25-35 pounds (11-16 kg) by the end of the pregnancy. °· You may begin to get stretch marks on your hips, abdomen, and breasts. °· You may urinate more often because the fetus is moving lower into your pelvis and pressing on your bladder. °· You may develop or continue to have heartburn as a result of your pregnancy. °· You may develop constipation because certain hormones are causing the muscles that push waste through your intestines to slow down. °· You may develop hemorrhoids or swollen, bulging veins (varicose veins). °· You may have pelvic pain because of the weight gain and pregnancy hormones relaxing your joints between the bones in your pelvis. Backaches may result from overexertion of the muscles supporting your posture. °· You may have changes in your hair. These can include thickening of your hair, rapid growth, and changes in texture. Some women also have hair loss during or after pregnancy, or hair that feels dry or thin. Your hair will most likely return to normal after your baby is born. °· Your breasts will continue to grow and be tender. A yellow discharge may leak from your breasts called colostrum. °· Your belly button may stick out. °· You may feel short of breath because of your expanding uterus. °· You may notice the fetus "dropping," or moving lower in your abdomen. °· You may have a bloody mucus discharge. This usually occurs a few days to a week before labor begins. °· Your cervix becomes thin and soft (effaced) near your due date. °WHAT TO EXPECT AT YOUR PRENATAL  EXAMS  °You will have prenatal exams every 2 weeks until week 36. Then, you will have weekly prenatal exams. During a routine prenatal visit: °· You will be weighed to make sure you and the fetus are growing normally. °· Your blood pressure is taken. °· Your abdomen will be measured to track your baby's growth. °· The fetal heartbeat will be listened to. °· Any test results from the previous visit will be discussed. °· You may have a cervical check near your due date to see if you have effaced. °At around 36 weeks, your caregiver will check your cervix. At the same time, your caregiver will also perform a test on the secretions of the vaginal tissue. This test is to determine if a type of bacteria, Group B streptococcus, is present. Your caregiver will explain this further. °Your caregiver may ask you: °· What your birth plan is. °· How you are feeling. °· If you are feeling the baby move. °· If you have had any abnormal symptoms, such as leaking fluid, bleeding, severe headaches, or abdominal cramping. °· If you are using any tobacco products, including cigarettes, chewing tobacco, and electronic cigarettes. °· If you have any questions. °Other tests or screenings that may be performed during your third trimester include: °· Blood tests that check for low iron levels (anemia). °· Fetal testing to check the health, activity level, and growth of the fetus. Testing is done if you have certain medical conditions or if   there are problems during the pregnancy. °· HIV (human immunodeficiency virus) testing. If you are at high risk, you may be screened for HIV during your third trimester of pregnancy. °FALSE LABOR °You may feel small, irregular contractions that eventually go away. These are called Braxton Hicks contractions, or false labor. Contractions may last for hours, days, or even weeks before true labor sets in. If contractions come at regular intervals, intensify, or become painful, it is best to be seen by your  caregiver.  °SIGNS OF LABOR  °· Menstrual-like cramps. °· Contractions that are 5 minutes apart or less. °· Contractions that start on the top of the uterus and spread down to the lower abdomen and back. °· A sense of increased pelvic pressure or back pain. °· A watery or bloody mucus discharge that comes from the vagina. °If you have any of these signs before the 37th week of pregnancy, call your caregiver right away. You need to go to the hospital to get checked immediately. °HOME CARE INSTRUCTIONS  °· Avoid all smoking, herbs, alcohol, and unprescribed drugs. These chemicals affect the formation and growth of the baby. °· Do not use any tobacco products, including cigarettes, chewing tobacco, and electronic cigarettes. If you need help quitting, ask your health care provider. You may receive counseling support and other resources to help you quit. °· Follow your caregiver's instructions regarding medicine use. There are medicines that are either safe or unsafe to take during pregnancy. °· Exercise only as directed by your caregiver. Experiencing uterine cramps is a good sign to stop exercising. °· Continue to eat regular, healthy meals. °· Wear a good support bra for breast tenderness. °· Do not use hot tubs, steam rooms, or saunas. °· Wear your seat belt at all times when driving. °· Avoid raw meat, uncooked cheese, cat litter boxes, and soil used by cats. These carry germs that can cause birth defects in the baby. °· Take your prenatal vitamins. °· Take 1500-2000 mg of calcium daily starting at the 20th week of pregnancy until you deliver your baby. °· Try taking a stool softener (if your caregiver approves) if you develop constipation. Eat more high-fiber foods, such as fresh vegetables or fruit and whole grains. Drink plenty of fluids to keep your urine clear or pale yellow. °· Take warm sitz baths to soothe any pain or discomfort caused by hemorrhoids. Use hemorrhoid cream if your caregiver approves. °· If  you develop varicose veins, wear support hose. Elevate your feet for 15 minutes, 3-4 times a day. Limit salt in your diet. °· Avoid heavy lifting, wear low heal shoes, and practice good posture. °· Rest a lot with your legs elevated if you have leg cramps or low back pain. °· Visit your dentist if you have not gone during your pregnancy. Use a soft toothbrush to brush your teeth and be gentle when you floss. °· A sexual relationship may be continued unless your caregiver directs you otherwise. °· Do not travel far distances unless it is absolutely necessary and only with the approval of your caregiver. °· Take prenatal classes to understand, practice, and ask questions about the labor and delivery. °· Make a trial run to the hospital. °· Pack your hospital bag. °· Prepare the baby's nursery. °· Continue to go to all your prenatal visits as directed by your caregiver. °SEEK MEDICAL CARE IF: °· You are unsure if you are in labor or if your water has broken. °· You have dizziness. °· You have   mild pelvic cramps, pelvic pressure, or nagging pain in your abdominal area.  You have persistent nausea, vomiting, or diarrhea.  You have a bad smelling vaginal discharge.  You have pain with urination. SEEK IMMEDIATE MEDICAL CARE IF:   You have a fever.  You are leaking fluid from your vagina.  You have spotting or bleeding from your vagina.  You have severe abdominal cramping or pain.  You have rapid weight loss or gain.  You have shortness of breath with chest pain.  You notice sudden or extreme swelling of your face, hands, ankles, feet, or legs.  You have not felt your baby move in over an hour.  You have severe headaches that do not go away with medicine.  You have vision changes.   This information is not intended to replace advice given to you by your health care provider. Make sure you discuss any questions you have with your health care provider.   Document Released: 01/23/2001 Document  Revised: 02/19/2014 Document Reviewed: 04/01/2012 Elsevier Interactive Patient Education 2016 [employer] Flank Pain Flank pain refers to pain that is located on the side of the body between the upper abdomen and the back. The pain may occur over a short period of time (acute) or may be long-term or reoccurring (chronic). It may be mild or severe. Flank pain can be caused by many things. CAUSES  Some of the more common causes of flank pain include:  Muscle strains.   Muscle spasms.   A disease of your spine (vertebral disk disease).   A lung infection (pneumonia).   Fluid around your lungs (pulmonary edema).   A kidney infection.   Kidney stones.   A very painful skin rash caused by the chickenpox virus (shingles).   Gallbladder disease.  HOME CARE INSTRUCTIONS  Home care will depend on the cause of your pain. In general,  Rest as directed by your caregiver.  Drink enough fluids to keep your urine clear or pale yellow.  Only take over-the-counter or prescription medicines as directed by your caregiver. Some medicines may help relieve the pain.  Tell your caregiver about any changes in your pain.  Follow up with your caregiver as directed. SEEK IMMEDIATE MEDICAL CARE IF:   Your pain is not controlled with medicine.   You have new or worsening symptoms.  Your pain increases.   You have abdominal pain.   You have shortness of breath.   You have persistent nausea or vomiting.   You have swelling in your abdomen.   You feel faint or pass out.   You have blood in your urine.  You have a fever or persistent symptoms for more than 2-3 days.  You have a fever and your symptoms suddenly get worse. MAKE SURE YOU:   Understand these instructions.  Will watch your condition.  Will get help right away if you are not doing well or get worse.   This information is not intended to replace advice given to you by your health care provider. Make sure  you discuss any questions you have with your health care provider.   Document Released: 03/22/2005 Document Revised: 10/24/2011 Document Reviewed: 09/13/2011 Elsevier Interactive Patient Education [employer]

## 2015-10-23 NOTE — MAU Note (Signed)
Pt states she feels cramping for the last 20 minutes. Denies LOF or vaginal bleeding. Fetus active.

## 2015-10-23 NOTE — MAU Provider Note (Signed)
History     CSN: [id number]  Arrival date and time: 10/23/15 1002   None     Chief Complaint  Patient presents with  . Contractions   G1 @35 .6 c/o cramp on right flank pain about 1 hr ago. She describes as feeling like a cramp and extending into the right side of her back. She reports it lasted about 15-20 min. She didn't use home remedies or OTC. She denies LOF, VB, and ctx. She reports good FM. She reports recent cold symptoms with cough x2 days.   OB History    Gravida Para Term Preterm AB Living   1             SAB TAB Ectopic Multiple Live Births                  Past Medical History:  Diagnosis Date  . Medical history non-contributory     Past Surgical History:  Procedure Laterality Date  . NO PAST SURGERIES      History reviewed. No pertinent family history.  Social History  Substance Use Topics  . Smoking status: Never Smoker  . Smokeless tobacco: Never Used  . Alcohol use No    Allergies: No Known Allergies  Prescriptions Prior to Admission  Medication Sig Dispense Refill Last Dose  . ibuprofen (ADVIL,MOTRIN) 200 MG tablet Take 400 mg by mouth every 6 (six) hours as needed. Reported on 06/27/2015   Not Taking  . naproxen (NAPROSYN) 500 MG tablet Take 1 tablet (500 mg total) by mouth 2 (two) times daily. (Patient not taking: Reported on 05/03/2015) 30 tablet 0 Not Taking  . penicillin v potassium (VEETID) 500 MG tablet Take 1 tablet (500 mg total) by mouth 4 (four) times daily. (Patient not taking: Reported on 05/03/2015) 40 tablet 0 Not Taking  . Prenatal Vit-Fe Fumarate-FA (PRENATAL MULTIVITAMIN) TABS tablet Take 1 tablet by mouth daily at 12 noon.   Taking  . traMADol (ULTRAM) 50 MG tablet Take 1 tablet (50 mg total) by mouth every 6 (six) hours as needed. (Patient not taking: Reported on 05/03/2015) 15 tablet 0 Not Taking    Review of Systems  Constitutional: Negative.   Gastrointestinal: Positive for abdominal pain. Negative for constipation.   Genitourinary: Positive for flank pain. Negative for dysuria, frequency, hematuria and urgency.   Physical Exam   Blood pressure 105/64, pulse 113, temperature 97.6 F (36.4 C), temperature source Oral, resp. rate 18, last menstrual period 02/01/2015.  Physical Exam  Constitutional: She is oriented to person, place, and time. She appears well-developed and well-nourished.  HENT:  Head: Normocephalic and atraumatic.  Neck: Normal range of motion. Neck supple.  Cardiovascular: Normal rate.   Respiratory: Effort normal.  GI: Soft. She exhibits no distension. There is no tenderness. There is no CVA tenderness.  gravid  Genitourinary:  Genitourinary Comments: SVE: closed/30/-4  Musculoskeletal: Normal range of motion.       Lumbar back: She exhibits normal range of motion, no tenderness, no bony tenderness, no edema and no pain.  Neurological: She is alert and oriented to person, place, and time.  Skin: Skin is warm and dry.  Psychiatric: She has a normal mood and affect.  EFM: 145 bpm, mod variability, + accels, no decels Toco: irritability  Results for orders placed or performed during the hospital encounter of 10/23/15 (from the past 24 hour(s))  Urinalysis, Routine w reflex microscopic (not at [facility])     Status: Abnormal   Collection Time: 10/23/15 10:30  AM  Result Value Ref Range   Color, Urine YELLOW YELLOW   APPearance CLEAR CLEAR   Specific Gravity, Urine 1.010 1.005 - 1.030   pH >9.0 (H) 5.0 - 8.0   Glucose, UA NEGATIVE NEGATIVE mg/dL   Hgb urine dipstick NEGATIVE NEGATIVE   Bilirubin Urine NEGATIVE NEGATIVE   Ketones, ur 15 (A) NEGATIVE mg/dL   Protein, ur NEGATIVE NEGATIVE mg/dL   Nitrite NEGATIVE NEGATIVE   Leukocytes, UA NEGATIVE NEGATIVE    MAU Course  Procedures Tylenol 1g po x1  MDM Labs ordered and reviewed. No evidence of PTL or GU etiology. Likely MSK strain worsened by coughing. Stable for discharge home.   Assessment and Plan   1. Flank pain   2.  Third trimester pregnancy   3. NST (non-stress test) reactive    Discharge home Tylenol prn Heating pad on low/warm bath for comfort [facility] PTL precautions  Donette LarryMelanie Malcom Selmer, CNM 10/23/2015, 10:31 AM

## 2015-10-27 LAB — OB RESULTS CONSOLE GBS: STREP GROUP B AG: NEGATIVE

## 2015-11-11 ENCOUNTER — Inpatient Hospital Stay (HOSPITAL_COMMUNITY): Payer: Medicaid Other

## 2015-11-11 ENCOUNTER — Inpatient Hospital Stay (HOSPITAL_COMMUNITY)
Admission: AD | Admit: 2015-11-11 | Discharge: 2015-11-11 | Disposition: A | Discharge status: Home / Self Care | Payer: Medicaid Other | Source: Ambulatory Visit | Attending: Obstetrics and Gynecology | Admitting: Obstetrics and Gynecology

## 2015-11-11 ENCOUNTER — Encounter (HOSPITAL_COMMUNITY): Payer: Self-pay | Admitting: Certified Nurse Midwife

## 2015-11-11 DIAGNOSIS — O4693 Antepartum hemorrhage, unspecified, third trimester: Secondary | ICD-10-CM | POA: Diagnosis present

## 2015-11-11 DIAGNOSIS — Z3A38 38 weeks gestation of pregnancy: Secondary | ICD-10-CM | POA: Diagnosis not present

## 2015-11-11 MED ORDER — FERRIC SUBSULFATE 259 MG/GM EX SOLN
CUTANEOUS | Status: AC
  Filled 2015-11-11: qty 8

## 2015-11-11 NOTE — MAU Provider Note (Signed)
History     CSN: [id number]  Arrival date and time: 11/11/15 4098   First Provider Initiated Contact with Patient 11/11/15 [phone]      Chief Complaint  Patient presents with  . Vaginal Bleeding   HPI Carolyn Lynn is a 30 y.o. G1P0 at [redacted]w[redacted]d who presents with vaginal bleeding. Woke up this morning and noted bright red blood in toilet. Went to [facility] for appointment where they did a speculum exam & SVE; wasn't told anything other than that she should come to MAU. Saw another small gush of the blood when she got here. Denies abdominal pain, LOF, recent trauma, recent intercourse, constipation, or vomiting. Positive fetal movement.   OB History    Gravida Para Term Preterm AB Living   1             SAB TAB Ectopic Multiple Live Births                  Past Medical History:  Diagnosis Date  . Medical history non-contributory     Past Surgical History:  Procedure Laterality Date  . NO PAST SURGERIES      History reviewed. No pertinent family history.  Social History  Substance Use Topics  . Smoking status: Never Smoker  . Smokeless tobacco: Never Used  . Alcohol use No    Allergies: No Known Allergies  Prescriptions Prior to Admission  Medication Sig Dispense Refill Last Dose  . acetaminophen (TYLENOL) 500 MG tablet Take 1,000 mg by mouth every 6 (six) hours as needed for mild pain, moderate pain or headache.   Past Month at Unknown time  . ferrous sulfate 325 (65 FE) MG tablet Take 325 mg by mouth 2 (two) times daily with a meal.   11/11/2015 at Unknown time  . Prenatal Vit-Fe Fumarate-FA (PRENATAL MULTIVITAMIN) TABS tablet Take 1 tablet by mouth daily.    11/11/2015 at Unknown time    Review of Systems  Constitutional: Negative.   Gastrointestinal: Negative.   Genitourinary:       + vaginal bleeding   Physical Exam   Blood pressure 112/60, pulse 66, temperature 97.9 F (36.6 C), temperature source Oral, resp. rate 18, last menstrual period 02/01/2015.  Physical  Exam  Nursing note and vitals reviewed. Constitutional: She is oriented to person, place, and time. She appears well-developed and well-nourished. No distress.  HENT:  Head: Normocephalic and atraumatic.  Eyes: Conjunctivae are normal. Right eye exhibits no discharge. Left eye exhibits no discharge. No scleral icterus.  Neck: Normal range of motion.  Cardiovascular: Normal rate, regular rhythm and normal heart sounds.   No murmur heard. Respiratory: Effort normal and breath sounds normal. No respiratory distress. She has no wheezes.  GI: Soft. There is no tenderness.  Ctx palpate mild with relaxation between  Genitourinary:  Genitourinary Comments: Small amount of dark red blood cleared out with 3 fox swabs -- dark blood continues to slowly ooze from cervical os  Neurological: She is alert and oriented to person, place, and time.  Skin: Skin is warm and dry. She is not diaphoretic.  Psychiatric: She has a normal mood and affect. Her behavior is normal. Judgment and thought content normal.   Dilation: 1 Effacement (%): Thick Cervical Position: Posterior Presentation: Vertex Exam by:: Estanislado Spire NP  Fetal Tracing:  Baseline: 125 Variability: moderate Accelerations: 15x15 Decelerations: none  Toco:irrregular ctx 2-8   MAU Course  Procedures    MDM Category 1 tracing B positive per prenatal record  Ultrasound -- no SCH or abruption noted, posterior placenta above cervical os SVE 1/thick/-3 posterior S/w Dr. Alysia PennaErvin regarding exam. Will watch patient for 2 hours then he will come assess bleeding status Dr. Alysia PennaErvin on unit to assess patient Judeth HornErin Lawrence, NP  Assessment and Plan  OB attending. Pt seen and examined. SSE revealed some area of bleeding coming from the cervix at the 4 o'clock position. Monsel's solution applied. Good hemastasis noted. Followed up on pt an hour later. No further bleeding. Amendable for d/c home with labor precautions.

## 2015-11-11 NOTE — MAU Note (Signed)
Pt states she woke up this morning with bright red vaginal bleeding that she describes as a light period. [facility] and [facility] for evaluation. Pt denies LOF or ctxs. Fetus is active.

## 2015-11-11 NOTE — Discharge Instructions (Signed)
Vaginal Bleeding During Pregnancy, Third Trimester  A small amount of bleeding (spotting) from the vagina is common in pregnancy. Sometimes the bleeding is normal and is not a problem, and sometimes it is a sign of something serious. Be sure to tell your doctor about any bleeding from your vagina right away. HOME CARE  Watch your condition for any changes.  Follow your doctor's instructions about how active you can be.  If you are on bed rest:  You may need to stay in bed and only get up to use the bathroom.  You may be allowed to do some activities.  If you need help, make plans for someone to help you.  Write down:  The number of pads you use each day.  How often you change pads.  How soaked (saturated) your pads are.  Do not use tampons.  Do not douche.  Do not have sex or orgasms until your doctor says it is okay.  Follow your doctor's advice about lifting, driving, and doing physical activities.  If you pass any tissue from your vagina, save the tissue so you can show it to your doctor.  Only take medicines as told by your doctor.  Do not take aspirin because it can make you bleed.  Keep all follow-up visits as told by your doctor. GET HELP IF:   You bleed from your vagina.  You have cramps.  You have labor pains.  You have a fever that does not go away after you take medicine. GET HELP RIGHT AWAY IF:  You have very bad cramps in your back or belly (abdomen).  You have chills.  You have a gush of fluid from your vagina.  You pass large clots or tissue from your vagina.  You bleed more.  You feel light-headed or weak.  You pass out (faint).  You do not feel your baby move around as much as before. MAKE SURE YOU:  Understand these instructions.  Will watch your condition.  Will get help right away if you are not doing well or get worse.   This information is not intended to replace advice given to you by your health care provider. Make sure  you discuss any questions you have with your health care provider.   Document Released: 06/15/2013 Document Reviewed: 06/15/2013 Elsevier Interactive Patient Education 2016 [employer] Fetal Movement Counts Patient Name: __________________________________________________ Patient Due Date: ____________________ Performing a fetal movement count is highly recommended in high-risk pregnancies, but it is good for every pregnant woman to do. Your health care provider may ask you to start counting fetal movements at 28 weeks of the pregnancy. Fetal movements often increase:  After eating a full meal.  After physical activity.  After eating or drinking something sweet or cold.  At rest. Pay attention to when you feel the baby is most active. This will help you notice a pattern of your baby's sleep and wake cycles and what factors contribute to an increase in fetal movement. It is important to perform a fetal movement count at the same time each day when your baby is normally most active.  HOW TO COUNT FETAL MOVEMENTS 1. Find a quiet and comfortable area to sit or lie down on your left side. Lying on your left side provides the best blood and oxygen circulation to your baby. 2. Write down the day and time on a sheet of paper or in a journal. 3. Start counting kicks, flutters, swishes, rolls, or jabs in a 2-hour  period. You should feel at least 10 movements within 2 hours. 4. If you do not feel 10 movements in 2 hours, wait 2-3 hours and count again. Look for a change in the pattern or not enough counts in 2 hours. SEEK MEDICAL CARE IF:  You feel less than 10 counts in 2 hours, tried twice.  There is no movement in over an hour.  The pattern is changing or taking longer each day to reach 10 counts in 2 hours.  You feel the baby is not moving as he or she usually does. Date: ____________ Movements: ____________ Start time: ____________ Doreatha Martin time: ____________  Date: ____________ Movements:  ____________ Start time: ____________ Doreatha Martin time: ____________ Date: ____________ Movements: ____________ Start time: ____________ Doreatha Martin time: ____________ Date: ____________ Movements: ____________ Start time: ____________ Doreatha Martin time: ____________ Date: ____________ Movements: ____________ Start time: ____________ Doreatha Martin time: ____________ Date: ____________ Movements: ____________ Start time: ____________ Doreatha Martin time: ____________ Date: ____________ Movements: ____________ Start time: ____________ Doreatha Martin time: ____________ Date: ____________ Movements: ____________ Start time: ____________ Doreatha Martin time: ____________  Date: ____________ Movements: ____________ Start time: ____________ Doreatha Martin time: ____________ Date: ____________ Movements: ____________ Start time: ____________ Doreatha Martin time: ____________ Date: ____________ Movements: ____________ Start time: ____________ Doreatha Martin time: ____________ Date: ____________ Movements: ____________ Start time: ____________ Doreatha Martin time: ____________ Date: ____________ Movements: ____________ Start time: ____________ Doreatha Martin time: ____________ Date: ____________ Movements: ____________ Start time: ____________ Doreatha Martin time: ____________ Date: ____________ Movements: ____________ Start time: ____________ Doreatha Martin time: ____________  Date: ____________ Movements: ____________ Start time: ____________ Doreatha Martin time: ____________ Date: ____________ Movements: ____________ Start time: ____________ Doreatha Martin time: ____________ Date: ____________ Movements: ____________ Start time: ____________ Doreatha Martin time: ____________ Date: ____________ Movements: ____________ Start time: ____________ Doreatha Martin time: ____________ Date: ____________ Movements: ____________ Start time: ____________ Doreatha Martin time: ____________ Date: ____________ Movements: ____________ Start time: ____________ Doreatha Martin time: ____________ Date: ____________ Movements: ____________ Start time: ____________ Doreatha Martin  time: ____________  Date: ____________ Movements: ____________ Start time: ____________ Doreatha Martin time: ____________ Date: ____________ Movements: ____________ Start time: ____________ Doreatha Martin time: ____________ Date: ____________ Movements: ____________ Start time: ____________ Doreatha Martin time: ____________ Date: ____________ Movements: ____________ Start time: ____________ Doreatha Martin time: ____________ Date: ____________ Movements: ____________ Start time: ____________ Doreatha Martin time: ____________ Date: ____________ Movements: ____________ Start time: ____________ Doreatha Martin time: ____________ Date: ____________ Movements: ____________ Start time: ____________ Doreatha Martin time: ____________  Date: ____________ Movements: ____________ Start time: ____________ Doreatha Martin time: ____________ Date: ____________ Movements: ____________ Start time: ____________ Doreatha Martin time: ____________ Date: ____________ Movements: ____________ Start time: ____________ Doreatha Martin time: ____________ Date: ____________ Movements: ____________ Start time: ____________ Doreatha Martin time: ____________ Date: ____________ Movements: ____________ Start time: ____________ Doreatha Martin time: ____________ Date: ____________ Movements: ____________ Start time: ____________ Doreatha Martin time: ____________ Date: ____________ Movements: ____________ Start time: ____________ Doreatha Martin time: ____________  Date: ____________ Movements: ____________ Start time: ____________ Doreatha Martin time: ____________ Date: ____________ Movements: ____________ Start time: ____________ Doreatha Martin time: ____________ Date: ____________ Movements: ____________ Start time: ____________ Doreatha Martin time: ____________ Date: ____________ Movements: ____________ Start time: ____________ Doreatha Martin time: ____________ Date: ____________ Movements: ____________ Start time: ____________ Doreatha Martin time: ____________ Date: ____________ Movements: ____________ Start time: ____________ Doreatha Martin time: ____________ Date: ____________  Movements: ____________ Start time: ____________ Doreatha Martin time: ____________  Date: ____________ Movements: ____________ Start time: ____________ Doreatha Martin time: ____________ Date: ____________ Movements: ____________ Start time: ____________ Doreatha Martin time: ____________ Date: ____________ Movements: ____________ Start time: ____________ Doreatha Martin time: ____________ Date: ____________ Movements: ____________ Start time: ____________ Doreatha Martin time: ____________ Date: ____________ Movements: ____________ Start time: ____________ Doreatha Martin time: ____________ Date: ____________ Movements: ____________ Start time: ____________ Doreatha Martin time: ____________ Date: ____________  Movements: ____________ Start time: ____________ Doreatha MartinFinish time: ____________  Date: ____________ Movements: ____________ Start time: ____________ Doreatha MartinFinish time: ____________ Date: ____________ Movements: ____________ Start time: ____________ Doreatha MartinFinish time: ____________ Date: ____________ Movements: ____________ Start time: ____________ Doreatha MartinFinish time: ____________ Date: ____________ Movements: ____________ Start time: ____________ Doreatha MartinFinish time: ____________ Date: ____________ Movements: ____________ Start time: ____________ Doreatha MartinFinish time: ____________ Date: ____________ Movements: ____________ Start time: ____________ Doreatha MartinFinish time: ____________   This information is not intended to replace advice given to you by your health care provider. Make sure you discuss any questions you have with your health care provider.   Document Released: 02/28/2006 Document Revised: 02/19/2014 Document Reviewed: 11/26/2011 Elsevier Interactive Patient Education 2016 [employer]  Vaginal Delivery During delivery, your health care provider will help you give birth to your baby. During a vaginal delivery, you will work to push the baby out of your vagina. However, before you can push your baby out, a few things need to happen. The opening of your uterus (cervix) has to soften, thin  out, and open up (dilate) all the way to 10 cm. Also, your baby has to move down from the uterus into your vagina.  SIGNS OF LABOR  Your health care provider will first need to make sure you are in labor. Signs of labor include:   Passing what is called the mucous plug before labor begins. This is a small amount of blood-stained mucus.  Having regular, painful uterine contractions.   The time between contractions gets shorter.   The discomfort and pain gradually get more intense.  Contraction pains get worse when walking and do not go away when resting.   Your cervix becomes thinner (effacement) and dilates. BEFORE THE DELIVERY Once you are in labor and admitted into the hospital or care center, your health care provider may do the following:   Perform a complete physical exam.  Review any complications related to pregnancy or labor.  Check your blood pressure, pulse, temperature, and heart rate (vital signs).   Determine if, and when, the rupture of amniotic membranes occurred.  Do a vaginal exam (using a sterile glove and lubricant) to determine:   The position (presentation) of the baby. Is the baby's head presenting first (vertex) in the birth canal (vagina), or are the feet or buttocks first (breech)?   The level (station) of the baby's head within the birth canal.   The effacement and dilatation of the cervix.   An electronic fetal monitor is usually placed on your abdomen when you first arrive. This is used to monitor your contractions and the baby's heart rate.  When the monitor is on your abdomen (external fetal monitor), it can only pick up the frequency and length of your contractions. It cannot tell the strength of your contractions.  If it becomes necessary for your health care provider to know exactly how strong your contractions are or to see exactly what the baby's heart rate is doing, an internal monitor may be inserted into your vagina and uterus. Your  health care provider will discuss the benefits and risks of using an internal monitor and obtain your permission before inserting the device.  Continuous fetal monitoring may be needed if you have an epidural, are receiving certain medicines (such as oxytocin), or have pregnancy or labor complications.  An IV access tube may be placed into a vein in your arm to deliver fluids and medicines if necessary. THREE STAGES OF LABOR AND DELIVERY Normal labor and delivery is divided into three stages.  First Stage This stage starts when you begin to contract regularly and your cervix begins to efface and dilate. It ends when your cervix is completely open (fully dilated). The first stage is the longest stage of labor and can last from 3 hours to 15 hours.  Several methods are available to help with labor pain. You and your health care provider will decide which option is best for you. Options include:   Opioid medicines. These are strong pain medicines that you can get through your IV tube or as a shot into your muscle. These medicines lessen pain but do not make it go away completely.  Epidural. A medicine is given through a thin tube that is inserted in your back. The medicine numbs the lower part of your body and prevents any pain in that area.  Paracervical pain medicine. This is an injection of an anesthetic on each side of your cervix.   You may request natural childbirth, which does not involve the use of pain medicines or an epidural during labor and delivery. Instead, you will use other things, such as breathing exercises, to help cope with the pain. Second Stage The second stage of labor begins when your cervix is fully dilated at 10 cm. It continues until you push your baby down through the birth canal and the baby is born. This stage can take only minutes or several hours.  The location of your baby's head as it moves through the birth canal is reported as a number called a station. If the  baby's head has not started its descent, the station is described as being at minus 3 (-3). When your baby's head is at the zero station, it is at the middle of the birth canal and is engaged in the pelvis. The station of your baby helps indicate the progress of the second stage of labor.  When your baby is born, your health care provider may hold the baby with his or her head lowered to prevent amniotic fluid, mucus, and blood from getting into the baby's lungs. The baby's mouth and nose may be suctioned with a small bulb syringe to remove any additional fluid.  Your health care provider may then place the baby on your stomach. It is important to keep the baby from getting cold. To do this, the health care provider will dry the baby off, place the baby directly on your skin (with no blankets between you and the baby), and cover the baby with warm, dry blankets.   The umbilical cord is cut. Third Stage During the third stage of labor, your health care provider will deliver the placenta (afterbirth) and make sure your bleeding is under control. The delivery of the placenta usually takes about 5 minutes but can take up to 30 minutes. After the placenta is delivered, a medicine may be given either by IV or injection to help contract the uterus and control bleeding. If you are planning to breastfeed, you can try to do so now. After you deliver the placenta, your uterus should contract and get very firm. If your uterus does not remain firm, your health care provider will massage it. This is important because the contraction of the uterus helps cut off bleeding at the site where the placenta was attached to your uterus. If your uterus does not contract properly and stay firm, you may continue to bleed heavily. If there is a lot of bleeding, medicines may be given to contract the uterus and stop the  bleeding.    This information is not intended to replace advice given to you by your health care provider. Make  sure you discuss any questions you have with your health care provider.   Document Released: 11/08/2007 Document Revised: 02/19/2014 Document Reviewed: 09/26/2011 Elsevier Interactive Patient Education [employer]

## 2015-11-12 ENCOUNTER — Encounter (HOSPITAL_COMMUNITY): Payer: Self-pay | Admitting: *Deleted

## 2015-11-12 ENCOUNTER — Inpatient Hospital Stay (HOSPITAL_COMMUNITY)
Admission: AD | Admit: 2015-11-12 | Discharge: 2015-11-12 | Disposition: A | Discharge status: Home / Self Care | Payer: Medicaid Other | Source: Ambulatory Visit | Attending: Obstetrics & Gynecology | Admitting: Obstetrics & Gynecology

## 2015-11-12 ENCOUNTER — Inpatient Hospital Stay (HOSPITAL_COMMUNITY)
Admission: AD | Admit: 2015-11-12 | Discharge: 2015-11-15 | DRG: 775 | Disposition: A | Discharge status: Home / Self Care | Payer: Medicaid Other | Source: Ambulatory Visit | Attending: Obstetrics & Gynecology | Admitting: Obstetrics & Gynecology

## 2015-11-12 DIAGNOSIS — D649 Anemia, unspecified: Secondary | ICD-10-CM | POA: Diagnosis present

## 2015-11-12 DIAGNOSIS — Z3A38 38 weeks gestation of pregnancy: Secondary | ICD-10-CM

## 2015-11-12 DIAGNOSIS — IMO0001 Reserved for inherently not codable concepts without codable children: Secondary | ICD-10-CM

## 2015-11-12 DIAGNOSIS — O9902 Anemia complicating childbirth: Secondary | ICD-10-CM | POA: Diagnosis present

## 2015-11-12 NOTE — [facility] Note (Signed)
Pt reports she has had ctx on and off all night was here  Earlier today for labor check 1/80. Denies vag bleeding or leaking

## 2015-11-12 NOTE — [facility] Note (Signed)
Pt presents to [facility] with complaints of contractions since yesterday. Denies any vaginal bleeding or LOF. Was evaluated in [facility] yesterday and was 1cm

## 2015-11-12 NOTE — Discharge Instructions (Signed)
Vaginal Delivery °During delivery, your health care provider will help you give birth to your baby. During a vaginal delivery, you will work to push the baby out of your vagina. However, before you can push your baby out, a few things need to happen. The opening of your uterus (cervix) has to soften, thin out, and open up (dilate) all the way to 10 cm. Also, your baby has to move down from the uterus into your vagina.  °SIGNS OF LABOR  °Your health care provider will first need to make sure you are in labor. Signs of labor include:  °· Passing what is called the mucous plug before labor begins. This is a small amount of blood-stained mucus. °· Having regular, painful uterine contractions.   °· The time between contractions gets shorter.   °· The discomfort and pain gradually get more intense. °· Contraction pains get worse when walking and do not go away when resting.   °· Your cervix becomes thinner (effacement) and dilates. °BEFORE THE DELIVERY °Once you are in labor and admitted into the hospital or care center, your health care provider may do the following:  °· Perform a complete physical exam. °· Review any complications related to pregnancy or labor.  °· Check your blood pressure, pulse, temperature, and heart rate (vital signs).   °· Determine if, and when, the rupture of amniotic membranes occurred. °· Do a vaginal exam (using a sterile glove and lubricant) to determine:   °¨ The position (presentation) of the baby. Is the baby's head presenting first (vertex) in the birth canal (vagina), or are the feet or buttocks first (breech)?   °¨ The level (station) of the baby's head within the birth canal.   °¨ The effacement and dilatation of the cervix.   °· An electronic fetal monitor is usually placed on your abdomen when you first arrive. This is used to monitor your contractions and the baby's heart rate. °¨ When the monitor is on your abdomen (external fetal monitor), it can only pick up the frequency and  length of your contractions. It cannot tell the strength of your contractions. °¨ If it becomes necessary for your health care provider to know exactly how strong your contractions are or to see exactly what the baby's heart rate is doing, an internal monitor may be inserted into your vagina and uterus. Your health care provider will discuss the benefits and risks of using an internal monitor and obtain your permission before inserting the device. °¨ Continuous fetal monitoring may be needed if you have an epidural, are receiving certain medicines (such as oxytocin), or have pregnancy or labor complications. °· An IV access tube may be placed into a vein in your arm to deliver fluids and medicines if necessary. °THREE STAGES OF LABOR AND DELIVERY °Normal labor and delivery is divided into three stages. °First Stage °This stage starts when you begin to contract regularly and your cervix begins to efface and dilate. It ends when your cervix is completely open (fully dilated). The first stage is the longest stage of labor and can last from 3 hours to 15 hours.  °Several methods are available to help with labor pain. You and your health care provider will decide which option is best for you. Options include:  °· Opioid medicines. These are strong pain medicines that you can get through your IV tube or as a shot into your muscle. These medicines lessen pain but do not make it go away completely.  °· Epidural. A medicine is given through a thin tube that   is inserted in your back. The medicine numbs the lower part of your body and prevents any pain in that area.  Paracervical pain medicine. This is an injection of an anesthetic on each side of your cervix.   You may request natural childbirth, which does not involve the use of pain medicines or an epidural during labor and delivery. Instead, you will use other things, such as breathing exercises, to help cope with the pain. Second Stage The second stage of labor  begins when your cervix is fully dilated at 10 cm. It continues until you push your baby down through the birth canal and the baby is born. This stage can take only minutes or several hours.  The location of your baby's head as it moves through the birth canal is reported as a number called a station. If the baby's head has not started its descent, the station is described as being at minus 3 (-3). When your baby's head is at the zero station, it is at the middle of the birth canal and is engaged in the pelvis. The station of your baby helps indicate the progress of the second stage of labor.  When your baby is born, your health care provider may hold the baby with his or her head lowered to prevent amniotic fluid, mucus, and blood from getting into the baby's lungs. The baby's mouth and nose may be suctioned with a small bulb syringe to remove any additional fluid.  Your health care provider may then place the baby on your stomach. It is important to keep the baby from getting cold. To do this, the health care provider will dry the baby off, place the baby directly on your skin (with no blankets between you and the baby), and cover the baby with warm, dry blankets.   The umbilical cord is cut. Third Stage During the third stage of labor, your health care provider will deliver the placenta (afterbirth) and make sure your bleeding is under control. The delivery of the placenta usually takes about 5 minutes but can take up to 30 minutes. After the placenta is delivered, a medicine may be given either by IV or injection to help contract the uterus and control bleeding. If you are planning to breastfeed, you can try to do so now. After you deliver the placenta, your uterus should contract and get very firm. If your uterus does not remain firm, your health care provider will massage it. This is important because the contraction of the uterus helps cut off bleeding at the site where the placenta was attached  to your uterus. If your uterus does not contract properly and stay firm, you may continue to bleed heavily. If there is a lot of bleeding, medicines may be given to contract the uterus and stop the bleeding.    This information is not intended to replace advice given to you by your health care provider. Make sure you discuss any questions you have with your health care provider.   Document Released: 11/08/2007 Document Revised: 02/19/2014 Document Reviewed: 09/26/2011 Elsevier Interactive Patient Education 2016 [employer].  An Fetal Movement Counts Patient Name: __________________________________________________ Patient Due Date: ____________________ Performing a fetal movement count is highly recommended in high-risk pregnancies, but it is good for every pregnant woman to do. Your health care provider may ask you to start counting fetal movements at 28 weeks of the pregnancy. Fetal movements often increase:  After eating a full meal.  After physical activity.  After eating  or drinking something sweet or cold.  At rest. Pay attention to when you feel the baby is most active. This will help you notice a pattern of your baby's sleep and wake cycles and what factors contribute to an increase in fetal movement. It is important to perform a fetal movement count at the same time each day when your baby is normally most active.  HOW TO COUNT FETAL MOVEMENTS 1. Find a quiet and comfortable area to sit or lie down on your left side. Lying on your left side provides the best blood and oxygen circulation to your baby. 2. Write down the day and time on a sheet of paper or in a journal. 3. Start counting kicks, flutters, swishes, rolls, or jabs in a 2-hour period. You should feel at least 10 movements within 2 hours. 4. If you do not feel 10 movements in 2 hours, wait 2-3 hours and count again. Look for a change in the pattern or not enough counts in 2 hours. SEEK MEDICAL CARE IF:  You feel less  than 10 counts in 2 hours, tried twice.  There is no movement in over an hour.  The pattern is changing or taking longer each day to reach 10 counts in 2 hours.  You feel the baby is not moving as he or she usually does. Date: ____________ Movements: ____________ Start time: ____________ Doreatha MartinFinish time: ____________  Date: ____________ Movements: ____________ Start time: ____________ Doreatha MartinFinish time: ____________ Date: ____________ Movements: ____________ Start time: ____________ Doreatha MartinFinish time: ____________ Date: ____________ Movements: ____________ Start time: ____________ Doreatha MartinFinish time: ____________ Date: ____________ Movements: ____________ Start time: ____________ Doreatha MartinFinish time: ____________ Date: ____________ Movements: ____________ Start time: ____________ Doreatha MartinFinish time: ____________ Date: ____________ Movements: ____________ Start time: ____________ Doreatha MartinFinish time: ____________ Date: ____________ Movements: ____________ Start time: ____________ Doreatha MartinFinish time: ____________  Date: ____________ Movements: ____________ Start time: ____________ Doreatha MartinFinish time: ____________ Date: ____________ Movements: ____________ Start time: ____________ Doreatha MartinFinish time: ____________ Date: ____________ Movements: ____________ Start time: ____________ Doreatha MartinFinish time: ____________ Date: ____________ Movements: ____________ Start time: ____________ Doreatha MartinFinish time: ____________ Date: ____________ Movements: ____________ Start time: ____________ Doreatha MartinFinish time: ____________ Date: ____________ Movements: ____________ Start time: ____________ Doreatha MartinFinish time: ____________ Date: ____________ Movements: ____________ Start time: ____________ Doreatha MartinFinish time: ____________  Date: ____________ Movements: ____________ Start time: ____________ Doreatha MartinFinish time: ____________ Date: ____________ Movements: ____________ Start time: ____________ Doreatha MartinFinish time: ____________ Date: ____________ Movements: ____________ Start time: ____________ Doreatha MartinFinish time: ____________ Date:  ____________ Movements: ____________ Start time: ____________ Doreatha MartinFinish time: ____________ Date: ____________ Movements: ____________ Start time: ____________ Doreatha MartinFinish time: ____________ Date: ____________ Movements: ____________ Start time: ____________ Doreatha MartinFinish time: ____________ Date: ____________ Movements: ____________ Start time: ____________ Doreatha MartinFinish time: ____________  Date: ____________ Movements: ____________ Start time: ____________ Doreatha MartinFinish time: ____________ Date: ____________ Movements: ____________ Start time: ____________ Doreatha MartinFinish time: ____________ Date: ____________ Movements: ____________ Start time: ____________ Doreatha MartinFinish time: ____________ Date: ____________ Movements: ____________ Start time: ____________ Doreatha MartinFinish time: ____________ Date: ____________ Movements: ____________ Start time: ____________ Doreatha MartinFinish time: ____________ Date: ____________ Movements: ____________ Start time: ____________ Doreatha MartinFinish time: ____________ Date: ____________ Movements: ____________ Start time: ____________ Doreatha MartinFinish time: ____________  Date: ____________ Movements: ____________ Start time: ____________ Doreatha MartinFinish time: ____________ Date: ____________ Movements: ____________ Start time: ____________ Doreatha MartinFinish time: ____________ Date: ____________ Movements: ____________ Start time: ____________ Doreatha MartinFinish time: ____________ Date: ____________ Movements: ____________ Start time: ____________ Doreatha MartinFinish time: ____________ Date: ____________ Movements: ____________ Start time: ____________ Doreatha MartinFinish time: ____________ Date: ____________ Movements: ____________ Start time: ____________ Doreatha MartinFinish time: ____________ Date: ____________ Movements: ____________ Start time: ____________ Doreatha MartinFinish time: ____________  Date: ____________ Movements:  ____________ Start time: ____________ Doreatha Martin time: ____________ Date: ____________ Movements: ____________ Start time: ____________ Doreatha Martin time: ____________ Date: ____________ Movements: ____________ Start  time: ____________ Doreatha Martin time: ____________ Date: ____________ Movements: ____________ Start time: ____________ Doreatha Martin time: ____________ Date: ____________ Movements: ____________ Start time: ____________ Doreatha Martin time: ____________ Date: ____________ Movements: ____________ Start time: ____________ Doreatha Martin time: ____________ Date: ____________ Movements: ____________ Start time: ____________ Doreatha Martin time: ____________  Date: ____________ Movements: ____________ Start time: ____________ Doreatha Martin time: ____________ Date: ____________ Movements: ____________ Start time: ____________ Doreatha Martin time: ____________ Date: ____________ Movements: ____________ Start time: ____________ Doreatha Martin time: ____________ Date: ____________ Movements: ____________ Start time: ____________ Doreatha Martin time: ____________ Date: ____________ Movements: ____________ Start time: ____________ Doreatha Martin time: ____________ Date: ____________ Movements: ____________ Start time: ____________ Doreatha Martin time: ____________ Date: ____________ Movements: ____________ Start time: ____________ Doreatha Martin time: ____________  Date: ____________ Movements: ____________ Start time: ____________ Doreatha Martin time: ____________ Date: ____________ Movements: ____________ Start time: ____________ Doreatha Martin time: ____________ Date: ____________ Movements: ____________ Start time: ____________ Doreatha Martin time: ____________ Date: ____________ Movements: ____________ Start time: ____________ Doreatha Martin time: ____________ Date: ____________ Movements: ____________ Start time: ____________ Doreatha Martin time: ____________ Date: ____________ Movements: ____________ Start time: ____________ Doreatha Martin time: ____________   This information is not intended to replace advice given to you by your health care provider. Make sure you discuss any questions you have with your health care provider.   Document Released: 02/28/2006 Document Revised: 02/19/2014 Document Reviewed: 11/26/2011 Elsevier  Interactive Patient Education [employer]

## 2015-11-13 ENCOUNTER — Encounter (HOSPITAL_COMMUNITY): Payer: Self-pay

## 2015-11-13 ENCOUNTER — Inpatient Hospital Stay (HOSPITAL_COMMUNITY): Payer: Medicaid Other | Admitting: Anesthesiology

## 2015-11-13 DIAGNOSIS — O9902 Anemia complicating childbirth: Secondary | ICD-10-CM | POA: Diagnosis present

## 2015-11-13 DIAGNOSIS — Z3403 Encounter for supervision of normal first pregnancy, third trimester: Secondary | ICD-10-CM | POA: Diagnosis present

## 2015-11-13 DIAGNOSIS — D649 Anemia, unspecified: Secondary | ICD-10-CM | POA: Diagnosis present

## 2015-11-13 DIAGNOSIS — IMO0001 Reserved for inherently not codable concepts without codable children: Secondary | ICD-10-CM

## 2015-11-13 DIAGNOSIS — Z3A38 38 weeks gestation of pregnancy: Secondary | ICD-10-CM

## 2015-11-13 LAB — CBC
HCT: 38.3 % (ref 36.0–46.0)
Hemoglobin: 12.7 g/dL (ref 12.0–15.0)
MCH: 27.4 pg (ref 26.0–34.0)
MCHC: 33.2 g/dL (ref 30.0–36.0)
MCV: 82.7 fL (ref 78.0–100.0)
PLATELETS: 128 10*3/uL — AB (ref 150–400)
RBC: 4.63 MIL/uL (ref 3.87–5.11)
RDW: 13.5 % (ref 11.5–15.5)
WBC: 9.5 10*3/uL (ref 4.0–10.5)

## 2015-11-13 LAB — ABO/RH: ABO/RH(D): B POS

## 2015-11-13 LAB — TYPE AND SCREEN
ABO/RH(D): B POS
Antibody Screen: NEGATIVE

## 2015-11-13 LAB — RPR: RPR Ser Ql: NONREACTIVE

## 2015-11-13 MED ORDER — PHENYLEPHRINE 40 MCG/ML (10ML) SYRINGE FOR IV PUSH (FOR BLOOD PRESSURE SUPPORT)
80.0000 ug | PREFILLED_SYRINGE | INTRAVENOUS | Status: DC | PRN
  Administered 2015-11-13 (×2): 80 ug via INTRAVENOUS
  Filled 2015-11-13: qty 5

## 2015-11-13 MED ORDER — ACETAMINOPHEN 325 MG PO TABS: 650.0000 mg | ORAL_TABLET | ORAL | Status: DC | PRN

## 2015-11-13 MED ORDER — EPHEDRINE 5 MG/ML INJ
10.0000 mg | INTRAVENOUS | Status: DC | PRN
  Filled 2015-11-13: qty 4

## 2015-11-13 MED ORDER — SENNOSIDES-DOCUSATE SODIUM 8.6-50 MG PO TABS
2.0000 | ORAL_TABLET | ORAL | Status: DC
  Administered 2015-11-14 – 2015-11-15 (×2): 2 via ORAL
  Filled 2015-11-13 (×2): qty 2

## 2015-11-13 MED ORDER — FLEET ENEMA 7-19 GM/118ML RE ENEM: 1.0000 | ENEMA | RECTAL | Status: DC | PRN

## 2015-11-13 MED ORDER — LIDOCAINE HCL (PF) 1 % IJ SOLN
INTRAMUSCULAR | Status: DC | PRN
  Administered 2015-11-13: 4 mL
  Administered 2015-11-13: 6 mL via EPIDURAL

## 2015-11-13 MED ORDER — IBUPROFEN 800 MG PO TABS: 800.0000 mg | ORAL_TABLET | Freq: Four times a day (QID) | ORAL | Status: DC | PRN

## 2015-11-13 MED ORDER — FENTANYL 2.5 MCG/ML BUPIVACAINE 1/10 % EPIDURAL INFUSION (WH - ANES)
14.0000 mL/h | INTRAMUSCULAR | Status: DC | PRN
  Administered 2015-11-13 (×2): 14 mL/h via EPIDURAL
  Filled 2015-11-13 (×2): qty 125

## 2015-11-13 MED ORDER — ONDANSETRON HCL 4 MG/2ML IJ SOLN: 4.0000 mg | INTRAMUSCULAR | Status: DC | PRN

## 2015-11-13 MED ORDER — DIPHENHYDRAMINE HCL 50 MG/ML IJ SOLN: 12.5000 mg | INTRAMUSCULAR | Status: DC | PRN

## 2015-11-13 MED ORDER — OXYTOCIN BOLUS FROM INFUSION
500.0000 mL | Freq: Once | INTRAVENOUS | Status: AC
  Administered 2015-11-13: 500 mL via INTRAVENOUS

## 2015-11-13 MED ORDER — OXYCODONE-ACETAMINOPHEN 5-325 MG PO TABS: 2.0000 | ORAL_TABLET | ORAL | Status: DC | PRN

## 2015-11-13 MED ORDER — SOD CITRATE-CITRIC ACID 500-334 MG/5ML PO SOLN: 30.0000 mL | ORAL | Status: DC | PRN

## 2015-11-13 MED ORDER — LACTATED RINGERS IV SOLN
INTRAVENOUS | Status: DC
  Administered 2015-11-13 (×2): via INTRAUTERINE

## 2015-11-13 MED ORDER — LACTATED RINGERS IV SOLN
500.0000 mL | Freq: Once | INTRAVENOUS | Status: AC
  Administered 2015-11-13: 500 mL via INTRAVENOUS

## 2015-11-13 MED ORDER — COCONUT OIL OIL: 1.0000 "application " | TOPICAL_OIL | Status: DC | PRN

## 2015-11-13 MED ORDER — LACTATED RINGERS IV SOLN
500.0000 mL | INTRAVENOUS | Status: DC | PRN
  Administered 2015-11-13: 300 mL via INTRAVENOUS
  Administered 2015-11-13: 500 mL via INTRAVENOUS
  Administered 2015-11-13: 1000 mL via INTRAVENOUS

## 2015-11-13 MED ORDER — HYDROXYZINE HCL 50 MG PO TABS
50.0000 mg | ORAL_TABLET | Freq: Four times a day (QID) | ORAL | Status: DC | PRN
  Filled 2015-11-13: qty 1

## 2015-11-13 MED ORDER — SODIUM CHLORIDE 0.9% FLUSH: 3.0000 mL | INTRAVENOUS | Status: DC | PRN

## 2015-11-13 MED ORDER — SODIUM CHLORIDE 0.9% FLUSH
3.0000 mL | Freq: Two times a day (BID) | INTRAVENOUS | Status: DC
  Administered 2015-11-13: 3 mL via INTRAVENOUS

## 2015-11-13 MED ORDER — OXYCODONE-ACETAMINOPHEN 5-325 MG PO TABS: 1.0000 | ORAL_TABLET | ORAL | Status: DC | PRN

## 2015-11-13 MED ORDER — LACTATED RINGERS IV SOLN
INTRAVENOUS | Status: DC
  Administered 2015-11-13: 125 mL/h via INTRAVENOUS
  Administered 2015-11-13: 08:00:00 via INTRAVENOUS

## 2015-11-13 MED ORDER — ONDANSETRON HCL 4 MG/2ML IJ SOLN: 4.0000 mg | Freq: Four times a day (QID) | INTRAMUSCULAR | Status: DC | PRN

## 2015-11-13 MED ORDER — SODIUM CHLORIDE 0.9 % IV SOLN: 250.0000 mL | INTRAVENOUS | Status: DC | PRN

## 2015-11-13 MED ORDER — DIPHENHYDRAMINE HCL 25 MG PO CAPS: 25.0000 mg | ORAL_CAPSULE | Freq: Four times a day (QID) | ORAL | Status: DC | PRN

## 2015-11-13 MED ORDER — BENZOCAINE-MENTHOL 20-0.5 % EX AERO
1.0000 "application " | INHALATION_SPRAY | CUTANEOUS | Status: DC | PRN
  Administered 2015-11-13: 1 via TOPICAL
  Filled 2015-11-13: qty 56

## 2015-11-13 MED ORDER — LIDOCAINE HCL (PF) 1 % IJ SOLN
30.0000 mL | INTRAMUSCULAR | Status: DC | PRN
  Filled 2015-11-13: qty 30

## 2015-11-13 MED ORDER — IBUPROFEN 600 MG PO TABS
600.0000 mg | ORAL_TABLET | Freq: Four times a day (QID) | ORAL | Status: DC
  Administered 2015-11-13 – 2015-11-15 (×9): 600 mg via ORAL
  Filled 2015-11-13 (×9): qty 1

## 2015-11-13 MED ORDER — ONDANSETRON HCL 4 MG PO TABS: 4.0000 mg | ORAL_TABLET | ORAL | Status: DC | PRN

## 2015-11-13 MED ORDER — PHENYLEPHRINE 40 MCG/ML (10ML) SYRINGE FOR IV PUSH (FOR BLOOD PRESSURE SUPPORT)
80.0000 ug | PREFILLED_SYRINGE | INTRAVENOUS | Status: DC | PRN
  Filled 2015-11-13: qty 5
  Filled 2015-11-13: qty 10

## 2015-11-13 MED ORDER — FENTANYL CITRATE (PF) 100 MCG/2ML IJ SOLN
INTRAMUSCULAR | Status: AC
  Administered 2015-11-13: 100 ug via INTRAVENOUS
  Filled 2015-11-13: qty 2

## 2015-11-13 MED ORDER — ZOLPIDEM TARTRATE 5 MG PO TABS: 5.0000 mg | ORAL_TABLET | Freq: Every evening | ORAL | Status: DC | PRN

## 2015-11-13 MED ORDER — SIMETHICONE 80 MG PO CHEW: 80.0000 mg | CHEWABLE_TABLET | ORAL | Status: DC | PRN

## 2015-11-13 MED ORDER — TETANUS-DIPHTH-ACELL PERTUSSIS 5-2.5-18.5 LF-MCG/0.5 IM SUSP: 0.5000 mL | Freq: Once | INTRAMUSCULAR | Status: DC

## 2015-11-13 MED ORDER — OXYTOCIN 40 UNITS IN LACTATED RINGERS INFUSION - SIMPLE MED
2.5000 [IU]/h | INTRAVENOUS | Status: DC
  Filled 2015-11-13: qty 1000

## 2015-11-13 MED ORDER — FENTANYL CITRATE (PF) 100 MCG/2ML IJ SOLN
50.0000 ug | INTRAMUSCULAR | Status: DC | PRN
  Administered 2015-11-13: 100 ug via INTRAVENOUS

## 2015-11-13 MED ORDER — PRENATAL MULTIVITAMIN CH
1.0000 | ORAL_TABLET | Freq: Every day | ORAL | Status: DC
  Administered 2015-11-14 – 2015-11-15 (×2): 1 via ORAL
  Filled 2015-11-13 (×2): qty 1

## 2015-11-13 MED ORDER — MEASLES, MUMPS & RUBELLA VAC ~~LOC~~ INJ: 0.5000 mL | INJECTION | Freq: Once | SUBCUTANEOUS | Status: DC

## 2015-11-13 NOTE — Anesthesia Procedure Notes (Addendum)

## 2015-11-13 NOTE — Progress Notes (Signed)
Carolyn Lynn is a 30 y.o. G1P0 at 969w6d by ultrasound admitted for active labor  Subjective:   Objective: BP 104/63   Pulse 81   Temp 98 F (36.7 C) (Oral)   Resp 18   Ht 5\' 4"  (1.626 m)   Wt 156 lb (70.8 kg)   LMP 02/01/2015 (Approximate)   SpO2 100%   BMI 26.78 kg/m  No intake/output data recorded. No intake/output data recorded.  FHT:  FHR: 140's bpm, variability: moderate,  accelerations:  Present,  decelerations:  Absent UC:   regular, every 2-4 minutes SVE:   Dilation: Lip/rim Effacement (%): 80 Station: 0 Exam by:: kim booker, cnm  Labs: Lab Results  Component Value Date   WBC 9.5 11/13/2015   HGB 12.7 11/13/2015   HCT 38.3 11/13/2015   MCV 82.7 11/13/2015   PLT 128 (L) 11/13/2015    Assessment / Plan: Spontaneous labor, progressing normally  Labor: Progressing normally Preeclampsia:  no signs or symptoms of toxicity Fetal Wellbeing:  Category I Pain Control:  Epidural I/D:  n/a Anticipated MOD:  NSVD  Carolyn Lynn 11/13/2015, 10:55 AM

## 2015-11-13 NOTE — Anesthesia Pain Management Evaluation Note (Signed)
  [facility]NA Pain Management Visit Note  Patient: Carolyn Lynn, 30 y.o., female  "Hello I am a member of the anesthesia team at [facility]. We have an anesthesia team available at all times to provide care throughout the hospital, including epidural management and anesthesia for C-section. I don't know your plan for the delivery whether it a natural birth, water birth, IV sedation, nitrous supplementation, doula or epidural, but we want to meet your pain goals."   1.Was your pain managed to your expectations on prior hospitalizations?   Yes   2.What is your expectation for pain management during this hospitalization?     Epidural  3.How can we help you reach that goal? unsure  Record the patient's initial score and the patient's pain goal.   Pain: 0  Pain Goal: 4 [facility] wants you to be able to say your pain was always managed very well.  Cephus ShellingBURGER,Carolyn Chauca 11/13/2015

## 2015-11-13 NOTE — Anesthesia Postprocedure Evaluation (Signed)
Anesthesia Post Note  Patient: Carolyn Lynn  Procedure(s) Performed: * No procedures listed *  Patient location during evaluation: Mother Baby Anesthesia Type: Epidural Level of consciousness: awake, awake and alert and oriented Pain management: pain level controlled Vital Signs Assessment: post-procedure vital signs reviewed and stable Respiratory status: spontaneous breathing, nonlabored ventilation and respiratory function stable Cardiovascular status: stable Postop Assessment: no headache, no backache, patient able to bend at knees, no signs of nausea or vomiting and adequate PO intake Anesthetic complications: no     Last Vitals:  Vitals:   11/13/15 1514 11/13/15 1615  BP: 103/65 (!) 94/59  Pulse: 96 89  Resp: 16 17  Temp: 36.8 C 36.8 C    Last Pain:  Vitals:   11/13/15 1615  TempSrc: Oral  PainSc:    Pain Goal: Patients Stated Pain Goal: 0 (11/12/15 2331)               Brande Uncapher

## 2015-11-13 NOTE — Lactation Note (Signed)
This note was copied from a baby's chart. Lactation Consultation Note Initial visit at 10 hours of age.  Mom reports a feeding of about 30 minutes several hours ago and denies pain with latch.  FOB holding baby STS post bath. Lc offered to assist with feeding and placed baby STS with mom in cross cradle hold.  Baby very sleepy and didn't latch.  [facility] given and discussed.  Encouraged to feed with early cues on demand.  Early newborn behavior discussed.  Hand expression demonstrated with colostrum visible.  Mom to call for assist as needed.    Patient Name: Carolyn Lynn UJWJX'BToday's Date: 11/13/2015 Reason for consult: Initial assessment   Maternal Data Has patient been taught Hand Expression?: Yes Does the patient have breastfeeding experience prior to this delivery?: No  Feeding Feeding Type: Breast Fed  LATCH Score/Interventions Latch: Too sleepy or reluctant, no latch achieved, no sucking elicited. Intervention(s): Skin to skin;Teach feeding cues;Waking techniques  Audible Swallowing: None  Type of Nipple: Everted at rest and after stimulation  Comfort (Breast/Nipple): Soft / non-tender     Hold (Positioning): Assistance needed to correctly position infant at breast and maintain latch. Intervention(s): Breastfeeding basics reviewed;Support Pillows;Position options;Skin to skin  LATCH Score: 5  Lactation Tools Discussed/Used WIC Program: Yes   Consult Status Consult Status: Follow-up Date: 11/14/15 Follow-up type: In-patient    Carolyn Lynn, Carolyn Lynn 11/13/2015, 9:56 PM

## 2015-11-13 NOTE — H&P (Signed)
Carolyn Lynn is a 30 y.o. G1P0 female at 4753w6d by 11wks presenting in active labor.   Reports active fetal movement, contractions: regular, vaginal bleeding: none, membranes: intact. [facility]    This pregnancy complicated by: anemia  Prenatal History/Complications:  primigravida  Past Medical History: Past Medical History:  Diagnosis Date  . Medical history non-contributory     Past Surgical History: Past Surgical History:  Procedure Laterality Date  . NO PAST SURGERIES      Obstetrical History: OB History    Gravida Para Term Preterm AB Living   1             SAB TAB Ectopic Multiple Live Births                  Social History: Social History   Social History  . Marital status: Single    Spouse name: N/A  . Number of children: N/A  . Years of education: N/A   Social History Main Topics  . Smoking status: Never Smoker  . Smokeless tobacco: Never Used  . Alcohol use No  . Drug use: No  . Sexual activity: Yes    Birth control/ protection: None   Other Topics Concern  . None   Social History Narrative  . None    Family History: No family history on file.  Allergies: No Known Allergies  Prescriptions Prior to Admission  Medication Sig Dispense Refill Last Dose  . acetaminophen (TYLENOL) 500 MG tablet Take 1,000 mg by mouth every 6 (six) hours as needed for mild pain, moderate pain or headache.   Past Month at Unknown time  . ferrous sulfate 325 (65 FE) MG tablet Take 325 mg by mouth 2 (two) times daily with a meal.   11/11/2015  . Prenatal Vit-Fe Fumarate-FA (PRENATAL MULTIVITAMIN) TABS tablet Take 1 tablet by mouth daily.    11/11/2015    Review of Systems  Pertinent pos/neg as indicated in HPI  Blood pressure 123/73, pulse 79, temperature 97.7 F (36.5 C), resp. rate 18, last menstrual period 02/01/2015, SpO2 100 %. General appearance: alert, cooperative and mild distress Lungs: clear to auscultation bilaterally Heart: regular  rate and rhythm Abdomen: gravid, soft, non-tender Extremities: tr edema DTR's 2+  Fetal monitoring: FHR: 150 bpm, variability: moderate,  Accelerations: Present,  decelerations:  Present undeterminable- not tracing continuously Uterine activity: 2-204mins Dilation: 6 Effacement (%): 90 Station: -2, -1 Exam by:: Carolyn Enganielle Simpson RN Presentation: cephalic   Prenatal labs: ABO, Rh: B/Positive/-- (03/16 0000) Antibody: Negative (03/16 0000) Rubella: !Error! RPR: Nonreactive (03/16 0000)  HBsAg: Negative (03/16 0000)  HIV: Non-reactive (03/16 0000)  GBS: Negative (09/14 0000)   1 hr Glucola: 117 Genetic screening:  neg Anatomy US: normal  No results found for this or any previous visit (from the past 24 hour(s)).   Assessment:  6053w6d SIUP  G1P0  Active labor  Cat 1 FHR  GBS Negative (09/14 0000)  Plan:  Admit to BS  IV pain meds/epidural prn active labor  Expectant management  Anticipate NSVB   Marge DuncansBooker, Willman Cuny Randall CNM, WHNP-BC 11/13/2015, 1:04 AM

## 2015-11-13 NOTE — Progress Notes (Addendum)
Patient ID: Carolyn Lynn, female   DOB: 07/09/1985, 30 y.o.   MRN: [id number] Carolyn Lynn is a 30 y.o. G1P0 at 867w6d admitted for active labor  Subjective: Comfortable w/ epidural, no complaints  Objective: BP (!) 96/58   Pulse 83   Temp 98 F (36.7 C)   Resp 18   Ht 5\' 4"  (1.626 m)   Wt 70.8 kg (156 lb)   LMP 02/01/2015 (Approximate)   SpO2 100%   BMI 26.78 kg/m  No intake/output data recorded.  FHT:  FHR: 135 bpm, variability: moderate,  accelerations:  Present,  decelerations:  Present occ variable UC:   q2-295mins MVUs inadequate  SVE:   Dilation: Lip/rim Effacement (%): 80 Station: 0 Exam by:: kim Janeisha Ryle, cnm fat anterior lip sm amt bright red show w/ small clots suspicious for partial abruption  Labs: Lab Results  Component Value Date   WBC 9.5 11/13/2015   HGB 12.7 11/13/2015   HCT 38.3 11/13/2015   MCV 82.7 11/13/2015   PLT 128 (L) 11/13/2015    Assessment / Plan: Spontaneous labor, progressing normally  Labor: Progressing normally Fetal Wellbeing:  Category II Pain Control:  Epidural Pre-eclampsia: n/a I/D:  n/a Anticipated MOD:  NSVD  Marge DuncansBooker, Zakar Brosch Randall CNM, WHNP-BC 11/13/2015, [phone]

## 2015-11-13 NOTE — Anesthesia Preprocedure Evaluation (Signed)

## 2015-11-14 NOTE — Lactation Note (Signed)
This note was copied from a baby's chart. Lactation Consultation Note  Baby recently bf on R side for 20 min and now cueing. Encouraged mother to bf on demand. Reviewed hand expression and glistening expressed. Assisted w/ latching on L side.  Sucks and swallows observed. Demonstrated how to compress breast to keep baby active. Mom encouraged to feed baby 8-12 times/24 hours and with feeding cues.    Patient Name: Boy Ishmael HolterJervita Gosline ZOXWR'UToday's Date: 11/14/2015 Reason for consult: Follow-up assessment   Maternal Data    Feeding Feeding Type: Breast Fed Length of feed: 20 min  LATCH Score/Interventions Latch: Grasps breast easily, tongue down, lips flanged, rhythmical sucking. Intervention(s): Skin to skin;Teach feeding cues;Waking techniques Intervention(s): Adjust position;Assist with latch;Breast massage;Breast compression  Audible Swallowing: A few with stimulation Intervention(s): Skin to skin;Hand expression Intervention(s): Skin to skin;Hand expression  Type of Nipple: Everted at rest and after stimulation  Comfort (Breast/Nipple): Soft / non-tender     Hold (Positioning): Assistance needed to correctly position infant at breast and maintain latch. Intervention(s): Support Pillows;Breastfeeding basics reviewed;Position options;Skin to skin  LATCH Score: 8  Lactation Tools Discussed/Used     Consult Status Consult Status: Follow-up Date: 11/15/15 Follow-up type: In-patient    Dahlia ByesBerkelhammer, Bradyn Vassey [facility] 11/14/2015, 12:28 PM

## 2015-11-14 NOTE — Plan of Care (Signed)
Problem: Nutritional: Goal: Mother's verbalization of comfort with breastfeeding process will improve Outcome: Progressing Pt denies concerns breastfeeding.  Latch observed and Pt easily able to get baby to open mouth wide and had good control of baby's head prior to latch.  Pt instructed to check the flange on the bottom lip and to hand express colostrum onto nipple and let air dry following feeding.  Pt verbalizes understanding.

## 2015-11-14 NOTE — Progress Notes (Signed)
Post Partum Day 1 Subjective: no complaints, up ad lib, voiding and tolerating PO  Objective: Blood pressure (!) 92/59, pulse 76, temperature 97.6 F (36.4 C), resp. rate 20, height 5\' 4"  (1.626 m), weight 156 lb (70.8 kg), last menstrual period 02/01/2015, SpO2 100 %, unknown if currently breastfeeding.  Physical Exam:  General: alert, cooperative, appears stated age and no distress Lochia: appropriate Uterine Fundus: firm Incision: n/a DVT Evaluation: No evidence of DVT seen on physical exam.   Recent Labs  11/13/15 0050  HGB 12.7  HCT 38.3    Assessment/Plan: Plan for discharge tomorrow   LOS: 1 day   Carolyn Lynn 11/14/2015, 6:50 AM

## 2015-11-15 ENCOUNTER — Ambulatory Visit: Payer: Self-pay

## 2015-11-15 MED ORDER — IBUPROFEN 600 MG PO TABS: 600.0000 mg | ORAL_TABLET | Freq: Four times a day (QID) | ORAL | 0 refills | Status: DC

## 2015-11-15 NOTE — Discharge Instructions (Signed)

## 2015-11-15 NOTE — Lactation Note (Signed)
This note was copied from a baby's chart. Lactation Consultation Note: Follow up visit, baby now 46 hours old. Mom reports he has been nursing well, last f52ed about 1 hour ago. He is asleep in mom's arms. LS by RN 8. Reports no pain with latch. Asking about when to start pumping- reviewed with mom. Has manual pump for now and plans to call WIC about DEBP. Back to work in [name]. Reviewed engorgement prevention and treatment. No questions at present. Reviewed our phone number, OP appointments and BFSG as resources for support after DC. To call prn  Patient Name: Carolyn Lynn Reason for consult: Follow-up assessment   Maternal Data Formula Feeding for Exclusion: No Has patient been taught Hand Expression?: Yes Does the patient have breastfeeding experience prior to this delivery?: No  Feeding Feeding Type: Breast Fed Length of feed: 30 min  LATCH Score/Interventions Latch: Grasps breast easily, tongue down, lips flanged, rhythmical sucking.  Audible Swallowing: A few with stimulation  Type of Nipple: Everted at rest and after stimulation  Comfort (Breast/Nipple): Soft / non-tender     Hold (Positioning): No assistance needed to correctly position infant at breast.  LATCH Score: 9  Lactation Tools [facility]ed/[facility]   Consult Status Consult Status: Complete    Carolyn Lynn, Carolyn Lynn, 9:41 AM

## 2015-11-15 NOTE — Lactation Note (Signed)
This note was copied from a baby's chart. Lactation Consultation Note  Patient Name: Boy Carolyn Lynn ZOXWR'UToday's Date: 11/15/2015 Reason for consult: Follow-up assessment;NICU baby  [facility]    Maternal Data Formula Feeding for Exclusion: No Has patient been taught Hand Expression?: Yes Does the patient have breastfeeding experience prior to this delivery?: No  Feeding Feeding Type: Formula Nipple Type: Slow - flow Length of feed: 5 min  LATCH Score/Interventions                      Lactation Tools Discussed/Used WIC Program: Yes Pump Review: Setup, frequency, and cleaning;Milk Storage Initiated by:: Noralee StainSharon Hice, RN, IBCLC Date initiated:: 11/15/15   Consult Status Consult Status: Follow-up Date: 11/15/15 Follow-up type: In-patient    Sherlyn HayJennifer D Tawona Filsinger 11/15/2015, 6:31 PM

## 2015-11-15 NOTE — Lactation Note (Signed)
This note was copied from a baby's chart. Lactation Consultation Note  Patient Name: Carolyn Lynn Today's Date: 11/15/2015 Reason for consult: Follow-up assessment;NICU baby   Infant was transferred to NICU to r/o Sepsis. Met with mom to set up DEBP. Instructions given to pump every 2-3 hours for 15 minutes with DEBP on Initiate setting. Enc mom to allow for 5 hour stretch at night to rest and not pump. DEBP was set up and mom pumping when I left the room. Enc mom to follow pumping with hand expression. Mom is aware of pumps in NICU and plans to BF infant in NICU when able. Providing Milk for your Baby in NICU Booklet given and reviewed.   Mom is a WIC client and is agreeable to renting a WIC loaner. Will return to finish pump rental after mom's meeting with Social Worker.    Maternal Data Formula Feeding for Exclusion: No Has patient been taught Hand Expression?: Yes Does the patient have breastfeeding experience prior to this delivery?: No  Feeding    LATCH Score/Interventions                      Lactation Tools Discussed/Used WIC Program: Yes Pump Review: Setup, frequency, and cleaning;Milk Storage Initiated by:: Sharon Hice, RN, IBCLC Date initiated:: 11/15/15   Consult Status Consult Status: Follow-up Date: 11/15/15 Follow-up type: In-patient    Sharon S Hice 11/15/2015, 3:25 PM    

## 2015-11-15 NOTE — Discharge Summary (Signed)
OB Discharge Summary  Patient Name: Carolyn Lynn DOB: 01/09/1986 MRN: [id number]  Date of admission: 11/12/2015 Delivering MD: Renetta ChalkELLIS, ASHLEY H   Date of discharge: 11/15/2015  Admitting diagnosis: 39wks, contractions Intrauterine pregnancy: 7636w6d     Secondary diagnosis:Active Problems:   Active labor at term  Additional problems: None    Discharge diagnosis: Term Pregnancy Delivered                                                                     Post partum procedures:none  Augmentation: none  Complications: None  Hospital course:  Onset of Labor With Vaginal Delivery     30 y.o. yo G1P1001 at 4036w6d was admitted in Active Labor on 11/12/2015. Patient had an uncomplicated labor course as follows:  Membrane Rupture Time/Date: 3:02 AM ,11/13/2015   Intrapartum Procedures: Episiotomy: None [1]                                         Lacerations:  2nd degree [3]  Patient had a delivery of a Viable infant. 11/13/2015  Information for the patient's newborn:  Berenice PrimasLawson, Boy Stephaniemarie [[id number]][[id number]]  Delivery Method: Vaginal, Spontaneous Delivery (Filed from Delivery Summary)    Pateint had an uncomplicated postpartum course.  She is ambulating, tolerating a regular diet, passing flatus, and urinating well. Patient is discharged home in stable condition on 11/15/15.    Physical exam Vitals:   11/13/15 2036 11/14/15 0508 11/14/15 1857 11/15/15 0524  BP: 100/62 (!) 92/59 103/85 (!) 106/57  Pulse: (!) 108 76 100 78  Resp: 18 20 20 18   Temp: 97.9 F (36.6 C) 97.6 F (36.4 C) 97.6 F (36.4 C) 98 F (36.7 C)  TempSrc: Oral  Oral   SpO2: 100%     Weight:      Height:       General: alert, cooperative and no distress Lochia: appropriate Uterine Fundus: firm Incision: N/A DVT Evaluation: No evidence of DVT seen on physical exam. Labs: Lab Results  Component Value Date   WBC 9.5 11/13/2015   HGB 12.7 11/13/2015   HCT 38.3 11/13/2015   MCV 82.7 11/13/2015   PLT 128 (L)  11/13/2015   No flowsheet data found.  Discharge instruction: per After Visit Summary and "Baby and Me Booklet".  After Visit Meds:    Medication List    STOP taking these medications   ferrous sulfate 325 (65 FE) MG tablet     TAKE these medications   acetaminophen 500 MG tablet Commonly known as:  TYLENOL Take 1,000 mg by mouth every 6 (six) hours as needed for mild pain, moderate pain or headache.   ibuprofen 600 MG tablet Commonly known as:  ADVIL,MOTRIN Take 1 tablet (600 mg total) by mouth every 6 (six) hours.   prenatal multivitamin Tabs tablet Take 1 tablet by mouth daily.       Diet: routine diet  Activity: Advance as tolerated. Pelvic rest for 6 weeks.   Outpatient follow up:6 weeks Follow up Appt:No future appointments. Follow up visit: No Follow-up on file.  Postpartum contraception: Progesterone only pills  Newborn Data: Live born  female  Birth Weight: 7 lb 1.8 oz (3225 g) APGAR: 8, 9  Baby Feeding: Breast Disposition:home with mother   11/15/2015 Hilton Sinclair, MD   OB FELLOW DISCHARGE ATTESTATION  I have seen and examined this patient and agree with above documentation in the resident's note.   Jen Mow, DO OB Fellow

## 2015-12-04 ENCOUNTER — Emergency Department (HOSPITAL_COMMUNITY)
Admission: EM | Admit: 2015-12-04 | Discharge: 2015-12-04 | Disposition: A | Discharge status: Home / Self Care | Payer: Medicaid Other | Attending: Emergency Medicine | Admitting: Emergency Medicine

## 2015-12-04 ENCOUNTER — Encounter (HOSPITAL_COMMUNITY): Payer: Self-pay

## 2015-12-04 DIAGNOSIS — O8622 Infection of bladder following delivery: Secondary | ICD-10-CM | POA: Insufficient documentation

## 2015-12-04 DIAGNOSIS — B9689 Other specified bacterial agents as the cause of diseases classified elsewhere: Secondary | ICD-10-CM | POA: Insufficient documentation

## 2015-12-04 DIAGNOSIS — N3000 Acute cystitis without hematuria: Secondary | ICD-10-CM

## 2015-12-04 LAB — URINALYSIS, ROUTINE W REFLEX MICROSCOPIC
GLUCOSE, UA: NEGATIVE mg/dL
Ketones, ur: NEGATIVE mg/dL
Nitrite: POSITIVE — AB
PH: 6 (ref 5.0–8.0)
PROTEIN: 100 mg/dL — AB
Specific Gravity, Urine: 1.022 (ref 1.005–1.030)

## 2015-12-04 LAB — URINE MICROSCOPIC-ADD ON

## 2015-12-04 MED ORDER — IBUPROFEN 600 MG PO TABS: 600.0000 mg | ORAL_TABLET | Freq: Four times a day (QID) | ORAL | 0 refills | Status: DC | PRN

## 2015-12-04 MED ORDER — DOCUSATE SODIUM 100 MG PO CAPS: 100.0000 mg | ORAL_CAPSULE | Freq: Two times a day (BID) | ORAL | 0 refills | Status: DC

## 2015-12-04 MED ORDER — CEPHALEXIN 500 MG PO CAPS: 500.0000 mg | ORAL_CAPSULE | Freq: Four times a day (QID) | ORAL | 0 refills | Status: AC

## 2015-12-04 NOTE — ED Provider Notes (Signed)
[facility]-EMERGENCY DEPT Provider Note   CSN: [id number] Arrival date & time: 12/04/15  1135     History   Chief Complaint Chief Complaint  Patient presents with  . multiple complaints    HPI Carolyn Lynn is a 30 y.o. female.  HPI  Carolyn Lynn is a 30 y.o. female, with a history of recent vaginal delivery, presenting to the ED with Intermittent headaches, lower left back pain, abdominal cramping, and fatigue beginning a few days after coming home from the hospital following a vaginal delivery. Patient also states that she has periods of time where she feels overwhelmed, begins to breathe very quickly and has to calm herself down. Patient was having worse abdominal cramping when she is breast-feeding, but states that as of the last few days she is exclusively giving her child formula. Abdominal cramping has improved since this time. Headache and lower back pain resolved with ibuprofen. Admits to poor oral intake and poor sleep. Patient has not consulted her midwife on this matter. She has an appointment with her midwife for her normal follow-up on November 8. Patient denies any feelings of hopelessness, desire or thoughts of hurting herself or her child, or any other moderate to severe depression symptoms. Patient further denies fever/chills, neuro deficits, increasing abdominal pain or vaginal bleeding, positional headache, or any other complaints.   Patient is G1P1 status post spontaneous vaginal delivery at [redacted]w[redacted]d without complications on October 1. Delivery performed by Wyvonnia Dusky, CNM. Epidural utilized. 2nd degree laceration. 200 mL EBL.   Patient has a support system at home who the patient states are willing to help her, including the child's father and the patient's grandmother. Patient's grandmother is at the bedside.  Past Medical History:  Diagnosis Date  . Medical history non-contributory     Patient Active Problem List   Diagnosis Date Noted  . Active labor at  term 11/13/2015    Past Surgical History:  Procedure Laterality Date  . NO PAST SURGERIES      OB History    Gravida Para Term Preterm AB Living   1 1 1     1    SAB TAB Ectopic Multiple Live Births         0 1       Home Medications    Prior to Admission medications   Medication Sig Start Date End Date Taking? Authorizing Provider  acetaminophen (TYLENOL) 500 MG tablet Take 1,000 mg by mouth every 6 (six) hours as needed for mild pain, moderate pain or headache.   Yes Historical Provider, MD  ibuprofen (ADVIL,MOTRIN) 200 MG tablet Take 200 mg by mouth every 6 (six) hours as needed for mild pain.   Yes Historical Provider, MD  Prenatal Vit-Fe Fumarate-FA (PRENATAL MULTIVITAMIN) TABS tablet Take 1 tablet by mouth daily.    Yes Historical Provider, MD  cephALEXin (KEFLEX) 500 MG capsule Take 1 capsule (500 mg total) by mouth 4 (four) times daily. 12/04/15 12/09/15  Kanai Berrios C Kassidy Dockendorf, PA-C  docusate sodium (COLACE) 100 MG capsule Take 1 capsule (100 mg total) by mouth every 12 (twelve) hours. 12/04/15   Tonia Avino C Rhiana Morash, PA-C  ibuprofen (ADVIL,MOTRIN) 600 MG tablet Take 1 tablet (600 mg total) by mouth every 6 (six) hours. Patient not taking: Reported on 12/04/2015 11/15/15   Campbell Stall, MD  ibuprofen (ADVIL,MOTRIN) 600 MG tablet Take 1 tablet (600 mg total) by mouth every 6 (six) hours as needed for headache, mild pain, moderate pain or cramping. 12/04/15  Anselm PancoastShawn C Kaylob Wallen, PA-C    Family History No family history on file.  Social History Social History  Substance Use Topics  . Smoking status: Never Smoker  . Smokeless tobacco: Never Used  . Alcohol use No     Allergies   Review of patient's allergies indicates no known allergies.   Review of Systems Review of Systems  Constitutional: Positive for fatigue. Negative for chills and fever.  Respiratory: Negative for cough and shortness of breath.   Cardiovascular: Negative for chest pain.  Gastrointestinal: Positive for abdominal  pain. Negative for blood in stool, diarrhea, nausea and vomiting.  Genitourinary: Negative for difficulty urinating, dysuria, frequency and pelvic pain.  Neurological: Positive for headaches (intermittent, none currently).  All other systems reviewed and are negative.    Physical Exam Updated Vital Signs BP 98/66 (BP Location: Left Arm)   Pulse 114   Temp 98.3 F (36.8 C) (Oral)   Resp 20   Ht 5\' 4"  (1.626 m)   Wt 66.2 kg   SpO2 100%   BMI 25.06 kg/m   Physical Exam  Constitutional: She is oriented to person, place, and time. She appears well-developed and well-nourished. No distress.  HENT:  Head: Normocephalic and atraumatic.  Eyes: Conjunctivae and EOM are normal. Pupils are equal, round, and reactive to light.  Neck: Neck supple.  Cardiovascular: Normal rate, regular rhythm, normal heart sounds and intact distal pulses.   Pulmonary/Chest: Effort normal and breath sounds normal. No respiratory distress.  Abdominal: Soft. There is no tenderness. There is no guarding.  Musculoskeletal: She exhibits no edema.  Minor tenderness to the left lumbar musculature. Full ROM in all extremities and spine. No midline spinal tenderness.   Lymphadenopathy:    She has no cervical adenopathy.  Neurological: She is alert and oriented to person, place, and time. She has normal reflexes.  No sensory deficits. Strength 5/5 in all extremities. No gait disturbance. Coordination intact. Cranial nerves III-XII grossly intact.   Skin: Skin is warm and dry. She is not diaphoretic.  Psychiatric: She has a normal mood and affect. Her behavior is normal.  Nursing note and vitals reviewed.    ED Treatments / Results  Labs (all labs ordered are listed, but only abnormal results are displayed) Labs Reviewed  URINALYSIS, ROUTINE W REFLEX MICROSCOPIC (NOT AT [facility]) - Abnormal; Notable for the following:       Result Value   Color, Urine AMBER (*)    APPearance TURBID (*)    Hgb urine dipstick  MODERATE (*)    Bilirubin Urine SMALL (*)    Protein, ur 100 (*)    Nitrite POSITIVE (*)    Leukocytes, UA LARGE (*)    All other components within normal limits  URINE MICROSCOPIC-ADD ON - Abnormal; Notable for the following:    Squamous Epithelial / LPF 6-30 (*)    Bacteria, UA MANY (*)    Casts GRANULAR CAST (*)    All other components within normal limits  URINE CULTURE    EKG  EKG Interpretation None       Radiology No results found.  Procedures Procedures (including critical care time)  Medications Ordered in ED Medications - No data to display   Initial Impression / Assessment and Plan / ED Course  I have reviewed the triage vital signs and the nursing notes.  Pertinent labs & imaging results that were available during my care of the patient were reviewed by me and considered in my medical decision making (  see chart for details).  Clinical Course    Patient presents with a variety of complaints during her postpartum period. These complaints are consistent with normal postpartum symptoms. The patient's back pain appears to be muscular in nature. There are no signs of neuro deficits or epidural abscess. Patient's textbook hypotension is noted. Chart review shows that the patient's current blood pressure is consistent with previous values.  Urinalysis shows UTI. Time was taken to properly educate and encourage the patient. Patient to continue ibuprofen as needed. Add Colace. Return precautions discussed.  Findings and plan of care discussed with Arby Barrette, MD.    Vitals:   12/04/15 1146 12/04/15 1217 12/04/15 1300 12/04/15 1341  BP:  92/60 106/72 98/70  Pulse:  97 112 107  Resp:  20 18 18   Temp:      TempSrc:      SpO2:  100% 94% 100%  Weight: 66.2 kg     Height: 5\' 4"  (1.626 m)        Final Clinical Impressions(s) / ED Diagnoses   Final diagnoses:  Acute cystitis without hematuria    New Prescriptions Discharge Medication List as of  12/04/2015  1:38 PM    START taking these medications   Details  cephALEXin (KEFLEX) 500 MG capsule Take 1 capsule (500 mg total) by mouth 4 (four) times daily., Starting Sun 12/04/2015, Until Fri 12/09/2015, Print    docusate sodium (COLACE) 100 MG capsule Take 1 capsule (100 mg total) by mouth every 12 (twelve) hours., Starting Sun 12/04/2015, Print    !! ibuprofen (ADVIL,MOTRIN) 600 MG tablet Take 1 tablet (600 mg total) by mouth every 6 (six) hours as needed for headache, mild pain, moderate pain or cramping., Starting Sun 12/04/2015, Print     !! - Potential duplicate medications found. Please discuss with provider.       Anselm Pancoast, PA-C 12/04/15 1933    Anselm Pancoast, PA-C 12/04/15 1933    Arby Barrette, MD 12/12/15 [phone]

## 2015-12-04 NOTE — Discharge Instructions (Signed)
The symptoms you are describing today are all consistent with normal postdelivery symptoms. Continue to eat regular meals, stay hydrated, and rest as much as possible. Use ibuprofen as needed for pain. Colace is a stool softener and should help facilitate regular bowel movements.  There is evidence of an UTI on urinalysis. Please take all of your antibiotics until finished!   You may develop abdominal discomfort or diarrhea from the antibiotic.  You may help offset this with probiotics which you can buy or get in yogurt. Do not eat or take the probiotics until 2 hours after your antibiotic.   Follow up with the midwife as soon as possible. [facility] should any symptoms worsen.

## 2015-12-04 NOTE — ED Triage Notes (Signed)
Patient has a baby 3 weeks ago and since that time has had headaches, abdominal pain, back pain, no energy to get out of bed to do anything related to ADL's. Alert and oriented, flat affect. This is her first child.

## 2015-12-06 LAB — URINE CULTURE

## 2015-12-07 ENCOUNTER — Telehealth (HOSPITAL_BASED_OUTPATIENT_CLINIC_OR_DEPARTMENT_OTHER): Payer: Self-pay | Admitting: Emergency Medicine

## 2015-12-07 ENCOUNTER — Telehealth: Payer: Self-pay | Admitting: Emergency Medicine

## 2015-12-07 NOTE — Telephone Encounter (Signed)
Post ED Visit - Positive Culture Follow-up  Culture report reviewed by antimicrobial stewardship pharmacist:  []  Enzo BiNathan Batchelder, Pharm.D. []  Celedonio MiyamotoJeremy Frens, Pharm.D., BCPS []  Garvin FilaMike Maccia, Pharm.D. []  Georgina PillionElizabeth Martin, Pharm.D., BCPS []  [place]h Pham, [address]Ph[address]D., BCPS, AAHIVP []  Estella HuskMichelle Turner, Pharm.D., BCPS, AAHIVP []  Tennis Mustassie Stewart, [address]Ph[address]D. []  Rob Oswaldo DoneVincent, [address]Ph[address] Casilda Carlsaylor Stone PharmD  Positive urine culture Treated with cephalexin, organism sensitive to the same and no further patient follow-up is required at this time.  Berle MullMiller, Tiondra Fang 12/07/2015, 10:06 AM

## 2015-12-11 ENCOUNTER — Inpatient Hospital Stay (HOSPITAL_COMMUNITY)
Admission: AD | Admit: 2015-12-11 | Discharge: 2015-12-11 | Disposition: A | Discharge status: Home / Self Care | Payer: Medicaid Other | Source: Ambulatory Visit | Attending: Obstetrics & Gynecology | Admitting: Obstetrics & Gynecology

## 2015-12-11 ENCOUNTER — Encounter (HOSPITAL_COMMUNITY): Payer: Self-pay | Admitting: *Deleted

## 2015-12-11 DIAGNOSIS — R1032 Left lower quadrant pain: Secondary | ICD-10-CM | POA: Insufficient documentation

## 2015-12-11 DIAGNOSIS — O9089 Other complications of the puerperium, not elsewhere classified: Secondary | ICD-10-CM | POA: Diagnosis not present

## 2015-12-11 DIAGNOSIS — R109 Unspecified abdominal pain: Secondary | ICD-10-CM | POA: Diagnosis present

## 2015-12-11 DIAGNOSIS — N939 Abnormal uterine and vaginal bleeding, unspecified: Secondary | ICD-10-CM | POA: Insufficient documentation

## 2015-12-11 DIAGNOSIS — K5900 Constipation, unspecified: Secondary | ICD-10-CM | POA: Diagnosis not present

## 2015-12-11 LAB — URINE MICROSCOPIC-ADD ON

## 2015-12-11 LAB — URINALYSIS, ROUTINE W REFLEX MICROSCOPIC
BILIRUBIN URINE: NEGATIVE
Glucose, UA: NEGATIVE mg/dL
KETONES UR: NEGATIVE mg/dL
NITRITE: NEGATIVE
Protein, ur: 100 mg/dL — AB
Specific Gravity, Urine: 1.02 (ref 1.005–1.030)
pH: 6 (ref 5.0–8.0)

## 2015-12-11 NOTE — MAU Note (Signed)
Delivered vaginally on October 1; having intermittent L flank painfor past 2 weeks finished ATB for UTI 2 days ago but still has pain, having some mild constipation even though she taking Colace;

## 2015-12-11 NOTE — MAU Provider Note (Signed)
History     CSN: [id number]  Arrival date and time: 12/11/15 0840   First Provider Initiated [id number] with Patient 12/11/15 [phone]      Chief Complaint  Patient presents with  . Abdominal Pain  . Vaginal Bleeding   HPI  Carolyn Lynn is a 30 y.o. [id number] female who presents 1 month s/p SVD for abdominal pain & vaginal bleeding. Reports intermittent LLQ pain x 2 weeks. [facility] abd pain & had negative workup. Pt states pain is intermittent & feels cramp like. Denies pain at this time. States when pain comes it's 10/10. Has been taking ibuprofen with mild relief. Endorses constipation. Last BM was yesterday but states she has to strain for small hard stools. Denies fever/chills, or n/v. Also reports vaginal bleeding x 3 days. States it is just lighter than menses.     OB History    Gravida Para Term Preterm AB Living   1 1 1     1    SAB TAB Ectopic Multiple Live Births         0 1      Past Medical History:  Diagnosis Date  . Medical history non-contributory     Past Surgical History:  Procedure Laterality Date  . NO PAST SURGERIES      No family history on file.  Social History  Substance Use Topics  . Smoking status: Never Smoker  . Smokeless tobacco: Never Used  . Alcohol use No    Allergies: No Known Allergies  Prescriptions Prior to Admission  Medication Sig Dispense Refill Last Dose  . acetaminophen (TYLENOL) 500 MG tablet Take 1,000 mg by mouth every 6 (six) hours as needed for mild pain, moderate pain or headache.   12/10/2015  . docusate sodium (COLACE) 100 MG capsule Take 1 capsule (100 mg total) by mouth every 12 (twelve) hours. 60 capsule 0 12/10/2015  . ibuprofen (ADVIL,MOTRIN) 200 MG tablet Take 200 mg by mouth every 6 (six) hours as needed for mild pain.   12/10/2015  . Prenatal Vit-Fe Fumarate-FA (PRENATAL MULTIVITAMIN) TABS tablet Take 1 tablet by mouth daily.    12/10/2015  . ferrous sulfate 325 (65 FE) MG tablet Take 325 mg by  mouth 3 (three) times daily.  5   . ibuprofen (ADVIL,MOTRIN) 600 MG tablet Take 1 tablet (600 mg total) by mouth every 6 (six) hours. (Patient not taking: Reported on 12/11/2015) 30 tablet 0 Not Taking at Unknown time  . ibuprofen (ADVIL,MOTRIN) 600 MG tablet Take 1 tablet (600 mg total) by mouth every 6 (six) hours as needed for headache, mild pain, moderate pain or cramping. (Patient not taking: Reported on 12/11/2015) 20 tablet 0 Not Taking at Unknown time    Review of Systems  Constitutional: Negative.   Gastrointestinal: Positive for abdominal pain and constipation. Negative for blood in stool, diarrhea, nausea and vomiting.  Genitourinary: Negative for dysuria.       + vaginal bleeding   Physical Exam   Blood pressure 111/65, pulse 70, temperature 98.3 F (36.8 C), temperature source Oral, resp. rate 16, height 5\' 4"  (1.626 m), weight 147 lb 12.8 oz (67 kg), unknown if currently breastfeeding.  Physical Exam  Nursing note and vitals reviewed. Constitutional: She is oriented to person, place, and time. She appears well-developed and well-nourished. No distress.  HENT:  Head: Normocephalic and atraumatic.  Eyes: Conjunctivae are normal. Right eye exhibits no discharge. Left eye exhibits no discharge. No scleral icterus.  Neck:  Normal range of motion.  Cardiovascular: Normal rate, regular rhythm and normal heart sounds.   No murmur heard. Respiratory: Effort normal and breath sounds normal. No respiratory distress. She has no wheezes.  GI: Soft. Bowel sounds are normal. She exhibits no distension. There is tenderness in the left lower quadrant. There is no rigidity, no rebound, no guarding and no CVA tenderness.  Genitourinary:  Genitourinary Comments: Small amount of dark red blood  Neurological: She is alert and oriented to person, place, and time.  Skin: Skin is warm and dry. She is not diaphoretic.  Psychiatric: She has a normal mood and affect. Her behavior is normal. Judgment  and thought content normal.    MAU Course  Procedures Results for orders placed or performed during the hospital encounter of 12/11/15 (from the past 24 hour(s))  Urinalysis, Routine w reflex microscopic (not [facility])     Status: Abnormal   Collection Time: 12/11/15  8:40 AM  Result Value Ref Range   Color, Urine AMBER (A) YELLOW   APPearance CLOUDY (A) CLEAR   Specific Gravity, Urine 1.020 1.005 - 1.030   pH 6.0 5.0 - 8.0   Glucose, UA NEGATIVE NEGATIVE mg/dL   Hgb urine dipstick LARGE (A) NEGATIVE   Bilirubin Urine NEGATIVE NEGATIVE   Ketones, ur NEGATIVE NEGATIVE mg/dL   Protein, ur 409100 (A) NEGATIVE mg/dL   Nitrite NEGATIVE NEGATIVE   Leukocytes, UA MODERATE (A) NEGATIVE  Urine microscopic-add on     Status: Abnormal   Collection Time: 12/11/15  8:40 AM  Result Value Ref Range   Squamous Epithelial / LPF 0-5 (A) NONE SEEN   WBC, UA 6-30 0 - 5 WBC/hpf   RBC / HPF TOO NUMEROUS TO COUNT 0 - 5 RBC/hpf   Bacteria, UA MANY (A) NONE SEEN   Casts GRANULAR CAST (A) NEGATIVE    MDM VSS, NAD Normal exam & small amount of bleeding. Discussed with patient that current bleeding is likely onset of menses.  Pt nontender on exam & had normal BSx4  Assessment and Plan  A: 1. Constipation, unspecified constipation type   2. Vaginal bleeding    P: Discharge home Take colace BID Increase fiber & water intake Discussed reasons to return to MAU vs ED Keep PP appt with [facility] 11/8  Judeth Hornrin Requan Hardge 12/11/2015, 9:40 AM

## 2015-12-11 NOTE — Discharge Instructions (Signed)

## 2015-12-11 NOTE — MAU Note (Signed)
Delivered a baby on 11/13/15, pain on left side  For a few weeks seen a cone and told she had UTI, finished antibiotic.    Pain still there and comes and goes.  Bleeding has also restarted

## 2015-12-12 LAB — URINE CULTURE: CULTURE: NO GROWTH

## 2015-12-13 ENCOUNTER — Inpatient Hospital Stay (HOSPITAL_COMMUNITY)
Admission: EM | Admit: 2015-12-13 | Discharge: 2015-12-27 | DRG: 776 | Disposition: A | Discharge status: Home / Self Care | Payer: Medicaid Other | Attending: Internal Medicine | Admitting: Internal Medicine

## 2015-12-13 ENCOUNTER — Encounter (HOSPITAL_COMMUNITY): Payer: Self-pay | Admitting: Emergency Medicine

## 2015-12-13 ENCOUNTER — Emergency Department (HOSPITAL_COMMUNITY): Payer: Medicaid Other

## 2015-12-13 DIAGNOSIS — O9089 Other complications of the puerperium, not elsewhere classified: Secondary | ICD-10-CM | POA: Diagnosis present

## 2015-12-13 DIAGNOSIS — E861 Hypovolemia: Secondary | ICD-10-CM | POA: Diagnosis present

## 2015-12-13 DIAGNOSIS — N136 Pyonephrosis: Secondary | ICD-10-CM | POA: Diagnosis present

## 2015-12-13 DIAGNOSIS — R509 Fever, unspecified: Secondary | ICD-10-CM

## 2015-12-13 DIAGNOSIS — N7011 Chronic salpingitis: Secondary | ICD-10-CM | POA: Diagnosis present

## 2015-12-13 DIAGNOSIS — A419 Sepsis, unspecified organism: Secondary | ICD-10-CM

## 2015-12-13 DIAGNOSIS — N1330 Unspecified hydronephrosis: Secondary | ICD-10-CM

## 2015-12-13 DIAGNOSIS — D649 Anemia, unspecified: Secondary | ICD-10-CM

## 2015-12-13 DIAGNOSIS — O2663 Liver and biliary tract disorders in the puerperium: Secondary | ICD-10-CM | POA: Diagnosis present

## 2015-12-13 DIAGNOSIS — R578 Other shock: Secondary | ICD-10-CM | POA: Diagnosis present

## 2015-12-13 DIAGNOSIS — R6521 Severe sepsis with septic shock: Secondary | ICD-10-CM | POA: Diagnosis present

## 2015-12-13 DIAGNOSIS — O85 Puerperal sepsis: Principal | ICD-10-CM | POA: Diagnosis present

## 2015-12-13 DIAGNOSIS — O904 Postpartum acute kidney failure: Secondary | ICD-10-CM | POA: Diagnosis present

## 2015-12-13 DIAGNOSIS — K5900 Constipation, unspecified: Secondary | ICD-10-CM | POA: Diagnosis present

## 2015-12-13 DIAGNOSIS — E872 Acidosis: Secondary | ICD-10-CM | POA: Diagnosis present

## 2015-12-13 DIAGNOSIS — O903 Peripartum cardiomyopathy: Secondary | ICD-10-CM | POA: Diagnosis present

## 2015-12-13 DIAGNOSIS — B9689 Other specified bacterial agents as the cause of diseases classified elsewhere: Secondary | ICD-10-CM | POA: Diagnosis present

## 2015-12-13 DIAGNOSIS — R162 Hepatomegaly with splenomegaly, not elsewhere classified: Secondary | ICD-10-CM | POA: Diagnosis present

## 2015-12-13 DIAGNOSIS — R579 Shock, unspecified: Secondary | ICD-10-CM | POA: Diagnosis present

## 2015-12-13 DIAGNOSIS — R17 Unspecified jaundice: Secondary | ICD-10-CM | POA: Diagnosis present

## 2015-12-13 DIAGNOSIS — I429 Cardiomyopathy, unspecified: Secondary | ICD-10-CM

## 2015-12-13 DIAGNOSIS — N390 Urinary tract infection, site not specified: Secondary | ICD-10-CM | POA: Diagnosis present

## 2015-12-13 DIAGNOSIS — R188 Other ascites: Secondary | ICD-10-CM | POA: Diagnosis present

## 2015-12-13 DIAGNOSIS — E876 Hypokalemia: Secondary | ICD-10-CM | POA: Diagnosis present

## 2015-12-13 DIAGNOSIS — B961 Klebsiella pneumoniae [K. pneumoniae] as the cause of diseases classified elsewhere: Secondary | ICD-10-CM | POA: Diagnosis present

## 2015-12-13 DIAGNOSIS — O9081 Anemia of the puerperium: Secondary | ICD-10-CM | POA: Diagnosis present

## 2015-12-13 DIAGNOSIS — R0682 Tachypnea, not elsewhere classified: Secondary | ICD-10-CM

## 2015-12-13 DIAGNOSIS — N179 Acute kidney failure, unspecified: Secondary | ICD-10-CM | POA: Diagnosis present

## 2015-12-13 DIAGNOSIS — R570 Cardiogenic shock: Secondary | ICD-10-CM | POA: Diagnosis present

## 2015-12-13 DIAGNOSIS — J984 Other disorders of lung: Secondary | ICD-10-CM

## 2015-12-13 DIAGNOSIS — N9489 Other specified conditions associated with female genital organs and menstrual cycle: Secondary | ICD-10-CM

## 2015-12-13 DIAGNOSIS — I5043 Acute on chronic combined systolic (congestive) and diastolic (congestive) heart failure: Secondary | ICD-10-CM | POA: Diagnosis present

## 2015-12-13 DIAGNOSIS — R160 Hepatomegaly, not elsewhere classified: Secondary | ICD-10-CM

## 2015-12-13 DIAGNOSIS — D509 Iron deficiency anemia, unspecified: Secondary | ICD-10-CM | POA: Diagnosis present

## 2015-12-13 DIAGNOSIS — I5041 Acute combined systolic (congestive) and diastolic (congestive) heart failure: Secondary | ICD-10-CM | POA: Diagnosis present

## 2015-12-13 LAB — URINALYSIS, ROUTINE W REFLEX MICROSCOPIC
GLUCOSE, UA: NEGATIVE mg/dL
KETONES UR: NEGATIVE mg/dL
Nitrite: POSITIVE — AB
PROTEIN: 100 mg/dL — AB
Specific Gravity, Urine: 1.019 (ref 1.005–1.030)
pH: 6 (ref 5.0–8.0)

## 2015-12-13 LAB — COMPREHENSIVE METABOLIC PANEL
ALT: 18 U/L (ref 14–54)
AST: 22 U/L (ref 15–41)
Albumin: 1.4 g/dL — ABNORMAL LOW (ref 3.5–5.0)
Alkaline Phosphatase: 84 U/L (ref 38–126)
Anion gap: 9 (ref 5–15)
BUN: 22 mg/dL — ABNORMAL HIGH (ref 6–20)
CHLORIDE: 112 mmol/L — AB (ref 101–111)
CO2: 18 mmol/L — AB (ref 22–32)
CREATININE: 2.22 mg/dL — AB (ref 0.44–1.00)
Calcium: 7.1 mg/dL — ABNORMAL LOW (ref 8.9–10.3)
GFR, EST AFRICAN AMERICAN: 33 mL/min — AB (ref >60)
GFR, EST NON AFRICAN AMERICAN: 29 mL/min — AB (ref >60)
Glucose, Bld: 104 mg/dL — ABNORMAL HIGH (ref 65–99)
POTASSIUM: 3.3 mmol/L — AB (ref 3.5–5.1)
SODIUM: 139 mmol/L (ref 135–145)
Total Bilirubin: 2 mg/dL — ABNORMAL HIGH (ref 0.3–1.2)
Total Protein: 5.6 g/dL — ABNORMAL LOW (ref 6.5–8.1)

## 2015-12-13 LAB — POC URINE PREG, ED: Preg Test, Ur: NEGATIVE

## 2015-12-13 LAB — CBC
HCT: 14.9 % — ABNORMAL LOW (ref 36.0–46.0)
Hemoglobin: 4.9 g/dL — CL (ref 12.0–15.0)
MCH: 24.6 pg — ABNORMAL LOW (ref 26.0–34.0)
MCHC: 32.9 g/dL (ref 30.0–36.0)
MCV: 74.9 fL — ABNORMAL LOW (ref 78.0–100.0)
Platelets: 151 10*3/uL (ref 150–400)
RBC: 1.99 MIL/uL — ABNORMAL LOW (ref 3.87–5.11)
RDW: 18.9 % — AB (ref 11.5–15.5)
WBC: 14.5 10*3/uL — AB (ref 4.0–10.5)

## 2015-12-13 LAB — BASIC METABOLIC PANEL
Anion gap: 10 (ref 5–15)
BUN: 25 mg/dL — AB (ref 6–20)
CALCIUM: 8 mg/dL — AB (ref 8.9–10.3)
CO2: 20 mmol/L — ABNORMAL LOW (ref 22–32)
CREATININE: 2.49 mg/dL — AB (ref 0.44–1.00)
Chloride: 108 mmol/L (ref 101–111)
GFR calc Af Amer: 29 mL/min — ABNORMAL LOW (ref >60)
GFR, EST NON AFRICAN AMERICAN: 25 mL/min — AB (ref >60)
GLUCOSE: 117 mg/dL — AB (ref 65–99)
Potassium: 4 mmol/L (ref 3.5–5.1)
SODIUM: 138 mmol/L (ref 135–145)

## 2015-12-13 LAB — I-STAT TROPONIN, ED: Troponin i, poc: 0.03 ng/mL (ref 0.00–0.08)

## 2015-12-13 LAB — IRON AND TIBC
Iron: 6 ug/dL — ABNORMAL LOW (ref 28–170)
Saturation Ratios: 4 % — ABNORMAL LOW (ref 10.4–31.8)
TIBC: 139 ug/dL — ABNORMAL LOW (ref 250–450)
UIBC: 133 ug/dL

## 2015-12-13 LAB — PROTIME-INR
INR: 1.49
PROTHROMBIN TIME: 18.2 s — AB (ref 11.4–15.2)

## 2015-12-13 LAB — PREPARE RBC (CROSSMATCH)

## 2015-12-13 LAB — URINE MICROSCOPIC-ADD ON

## 2015-12-13 LAB — I-STAT CG4 LACTIC ACID, ED: Lactic Acid, Venous: 1.62 mmol/L (ref 0.5–1.9)

## 2015-12-13 LAB — LIPASE, BLOOD: LIPASE: 13 U/L (ref 11–51)

## 2015-12-13 LAB — APTT: APTT: 51 s — AB (ref 24–36)

## 2015-12-13 LAB — ABO/RH: ABO/RH(D): B POS

## 2015-12-13 LAB — BRAIN NATRIURETIC PEPTIDE: B Natriuretic Peptide: 350.5 pg/mL — ABNORMAL HIGH (ref 0.0–100.0)

## 2015-12-13 MED ORDER — ALBUTEROL SULFATE (2.5 MG/3ML) 0.083% IN NEBU
5.0000 mg | INHALATION_SOLUTION | Freq: Once | RESPIRATORY_TRACT | Status: AC
  Administered 2015-12-13: 5 mg via RESPIRATORY_TRACT
  Filled 2015-12-13: qty 6

## 2015-12-13 MED ORDER — ACETAMINOPHEN 500 MG PO TABS
1000.0000 mg | ORAL_TABLET | Freq: Once | ORAL | Status: AC
  Administered 2015-12-13: 1000 mg via ORAL
  Filled 2015-12-13: qty 2

## 2015-12-13 MED ORDER — DEXTROSE 5 % IV SOLN
1.0000 g | Freq: Once | INTRAVENOUS | Status: AC
  Administered 2015-12-13: 1 g via INTRAVENOUS
  Filled 2015-12-13: qty 10

## 2015-12-13 MED ORDER — SODIUM CHLORIDE 0.9 % IV SOLN
Freq: Once | INTRAVENOUS | Status: AC
  Administered 2015-12-13: 19:00:00 via INTRAVENOUS

## 2015-12-13 MED ORDER — SODIUM CHLORIDE 0.9 % IV BOLUS (SEPSIS)
1000.0000 mL | Freq: Once | INTRAVENOUS | Status: AC
  Administered 2015-12-13: 1000 mL via INTRAVENOUS

## 2015-12-13 MED ORDER — ONDANSETRON HCL 4 MG/2ML IJ SOLN
4.0000 mg | Freq: Once | INTRAMUSCULAR | Status: AC
  Administered 2015-12-13: 4 mg via INTRAVENOUS
  Filled 2015-12-13: qty 2

## 2015-12-13 NOTE — ED Notes (Signed)
Returned from ct and placed on monitor

## 2015-12-13 NOTE — ED Provider Notes (Signed)
[facility]-EMERGENCY DEPT Provider Note   CSN: [id number] Arrival date & time: 12/13/15  1632     History   Chief Complaint Chief Complaint  Patient presents with  . Shortness of Breath  . Back Pain    HPI Rosita KeaJervita J Holdsworth is a 30 y.o. female.  Patient presents with several days of fatigue and worsening bilateral back pain, as well as upper abdominal pain. She is four weeks postpartum from a term spontaneous vaginal delivery and notes spotting since then but no large blood loss. Denies GI sources of blood loss. Seen here several days ago and diagnosed with a UTI, then put on Keflex. Denies history of bleeding or clotting disorders, no family history of this. Came back from triage hypotensive to the 70s/40s, mentating normally.  The history is provided by the patient. No language interpreter was used.  Back Pain   This is a new problem. The current episode started more than 2 days ago. The problem occurs constantly. The problem has been gradually worsening. The pain is associated with no known injury. The pain is present in the thoracic spine (Bilateral flanks). The quality of the pain is described as aching. The pain does not radiate. The pain is at a severity of 6/10. The pain is moderate. Exacerbated by: Palpation. The pain is the same all the time. Associated symptoms include abdominal pain and abdominal swelling. Pertinent negatives include no chest pain, no fever, no dysuria and no pelvic pain. Treatments tried: Keflex. The treatment provided no relief. Risk factors: Recently postpartum.    Past Medical History:  Diagnosis Date  . Medical history non-contributory     Patient Active Problem List   Diagnosis Date Noted  . Shock (HCC) 12/14/2015  . AKI (acute kidney injury) (HCC) 12/14/2015  . Jaundice 12/14/2015  . Hepatosplenomegaly 12/14/2015  . Anemia, iron deficiency 12/14/2015  . Mass of fallopian tube 12/14/2015  . Abnormal coagulation profile 12/14/2015    Past Surgical  History:  Procedure Laterality Date  . NO PAST SURGERIES      OB History    Gravida Para Term Preterm AB Living   1 1 1     1    SAB TAB Ectopic Multiple Live Births         0 1       Home Medications    Prior to Admission medications   Medication Sig Start Date End Date Taking? Authorizing Provider  docusate sodium (COLACE) 100 MG capsule Take 1 capsule (100 mg total) by mouth every 12 (twelve) hours. Patient taking differently: Take 100 mg by mouth 2 (two) times daily as needed for mild constipation.  12/04/15  Yes Shawn C Joy, PA-C  ferrous sulfate 325 (65 FE) MG tablet Take 325 mg by mouth 3 (three) times daily. 11/21/15  Yes Historical Provider, MD  ibuprofen (ADVIL,MOTRIN) 200 MG tablet Take 400-600 mg by mouth every 6 (six) hours as needed (for pain).    Yes Historical Provider, MD  Prenatal Vit-Fe Fumarate-FA (PRENATAL MULTIVITAMIN) TABS tablet Take 1 tablet by mouth daily.    Yes Historical Provider, MD    Family History History reviewed. No pertinent family history.  Social History Social History  Substance Use Topics  . Smoking status: Never Smoker  . Smokeless tobacco: Never Used  . Alcohol use No     Allergies   Review of patient's allergies indicates no known allergies.   Review of Systems Review of Systems  Constitutional: Positive for fatigue. Negative for fever.  HENT: Negative.   Respiratory: Negative for cough and shortness of breath.   Cardiovascular: Negative for chest pain.  Gastrointestinal: Positive for abdominal pain. Negative for vomiting.  Genitourinary: Positive for vaginal bleeding. Negative for dysuria and pelvic pain.  Musculoskeletal: Positive for back pain.  Skin: Negative.   Allergic/Immunologic: Negative for immunocompromised state.  Neurological: Negative.   Hematological: Does not bruise/bleed easily.  Psychiatric/Behavioral: Negative.      Physical Exam Updated Vital Signs BP 91/60   Pulse 99   Temp 98.1 F (36.7 C)  (Oral)   Resp (!) 33   Ht 5\' 4"  (1.626 m)   Wt 70.5 kg   SpO2 94%   BMI 26.68 kg/m   Physical Exam  Constitutional: She is oriented to person, place, and time. She appears well-developed and well-nourished. No distress.  HENT:  Head: Normocephalic and atraumatic.  Eyes: EOM are normal. No scleral icterus.  Pale conjunctiva  Cardiovascular: Regular rhythm, S1 normal, S2 normal and normal heart sounds.  Tachycardia present.   Pulses:      Radial pulses are 1+ on the right side, and 1+ on the left side.  Pulmonary/Chest: Breath sounds normal. Tachypnea noted. No respiratory distress.  Abdominal: She exhibits distension. There is tenderness in the right upper quadrant, epigastric area and left upper quadrant. There is CVA tenderness. There is no rigidity, no rebound, no guarding, no tenderness at McBurney's point and negative Murphy's sign.  Bilateral CVA tenderness  Musculoskeletal: She exhibits no edema.  Neurological: She is alert and oriented to person, place, and time.  Skin: Skin is warm. Capillary refill takes less than 2 seconds. No rash noted. She is not diaphoretic. There is pallor.  Psychiatric: She has a normal mood and affect. Her behavior is normal. Judgment and thought content normal.     ED Treatments / Results  Labs (all labs ordered are listed, but only abnormal results are displayed) Labs Reviewed  BASIC METABOLIC PANEL - Abnormal; Notable for the following:       Result Value   CO2 20 (*)    Glucose, Bld 117 (*)    BUN 25 (*)    Creatinine, Ser 2.49 (*)    Calcium 8.0 (*)    GFR calc non Af Amer 25 (*)    GFR calc Af Amer 29 (*)    All other components within normal limits  URINALYSIS, ROUTINE W REFLEX MICROSCOPIC (NOT [facility]) - Abnormal; Notable for the following:    Color, Urine AMBER (*)    APPearance TURBID (*)    Hgb urine dipstick LARGE (*)    Bilirubin Urine SMALL (*)    Protein, ur 100 (*)    Nitrite POSITIVE (*)    Leukocytes, UA LARGE (*)     All other components within normal limits  COMPREHENSIVE METABOLIC PANEL - Abnormal; Notable for the following:    Potassium 3.3 (*)    Chloride 112 (*)    CO2 18 (*)    Glucose, Bld 104 (*)    BUN 22 (*)    Creatinine, Ser 2.22 (*)    Calcium 7.1 (*)    Total Protein 5.6 (*)    Albumin 1.4 (*)    Total Bilirubin 2.0 (*)    GFR calc non Af Amer 29 (*)    GFR calc Af Amer 33 (*)    All other components within normal limits  BRAIN NATRIURETIC PEPTIDE - Abnormal; Notable for the following:    B Natriuretic Peptide 350.5 (*)  All other components within normal limits  PROTIME-INR - Abnormal; Notable for the following:    Prothrombin Time 18.2 (*)    All other components within normal limits  APTT - Abnormal; Notable for the following:    aPTT 51 (*)    All other components within normal limits  IRON AND TIBC - Abnormal; Notable for the following:    Iron 6 (*)    TIBC 139 (*)    Saturation Ratios 4 (*)    All other components within normal limits  URINE MICROSCOPIC-ADD ON - Abnormal; Notable for the following:    Squamous Epithelial / LPF 0-5 (*)    Bacteria, UA MANY (*)    All other components within normal limits  CBC - Abnormal; Notable for the following:    WBC 15.8 (*)    RBC 2.01 (*)    Hemoglobin 4.9 (*)    HCT 14.9 (*)    MCV 74.1 (*)    MCH 24.4 (*)    RDW 18.5 (*)    All other components within normal limits  CBC - Abnormal; Notable for the following:    WBC 14.5 (*)    RBC 1.99 (*)    Hemoglobin 4.9 (*)    HCT 14.9 (*)    MCV 74.9 (*)    MCH 24.6 (*)    RDW 18.9 (*)    All other components within normal limits  CBC - Abnormal; Notable for the following:    WBC 10.9 (*)    RBC 2.08 (*)    Hemoglobin 5.2 (*)    HCT 15.7 (*)    MCV 75.5 (*)    MCH 25.0 (*)    RDW 17.8 (*)    All other components within normal limits  BASIC METABOLIC PANEL - Abnormal; Notable for the following:    CO2 17 (*)    BUN 24 (*)    Creatinine, Ser 2.41 (*)    Calcium 7.4  (*)    GFR calc non Af Amer 26 (*)    GFR calc Af Amer 30 (*)    All other components within normal limits  BASIC METABOLIC PANEL - Abnormal; Notable for the following:    CO2 19 (*)    BUN 24 (*)    Creatinine, Ser 2.39 (*)    Calcium 7.8 (*)    GFR calc non Af Amer 26 (*)    GFR calc Af Amer 30 (*)    All other components within normal limits  DIC (DISSEMINATED INTRAVASCULAR COAGULATION) PANEL - Abnormal; Notable for the following:    Prothrombin Time 17.7 (*)    aPTT 47 (*)    Fibrinogen 491 (*)    D-Dimer, Quant 7.21 (*)    All other components within normal limits  RETICULOCYTES - Abnormal; Notable for the following:    RBC. 2.63 (*)    All other components within normal limits  TROPONIN I - Abnormal; Notable for the following:    Troponin I 0.03 (*)    All other components within normal limits  CBC - Abnormal; Notable for the following:    WBC 12.2 (*)    RBC 2.68 (*)    Hemoglobin 6.8 (*)    HCT 20.4 (*)    MCV 76.1 (*)    MCH 25.4 (*)    RDW 17.3 (*)    All other components within normal limits  CBC - Abnormal; Notable for the following:    WBC 14.2 (*)  RBC 3.28 (*)    Hemoglobin 8.2 (*)    HCT 25.2 (*)    MCV 76.8 (*)    MCH 25.0 (*)    RDW 17.2 (*)    All other components within normal limits  PHOSPHORUS - Abnormal; Notable for the following:    Phosphorus 6.0 (*)    All other components within normal limits  BILIRUBIN, FRACTIONATED(TOT/DIR/INDIR) - Abnormal; Notable for the following:    Total Bilirubin 3.2 (*)    Bilirubin, Direct 2.1 (*)    Indirect Bilirubin 1.1 (*)    All other components within normal limits  ANTITHROMBIN III - Abnormal; Notable for the following:    AntiThromb III Func 54 (*)    All other components within normal limits  BASIC METABOLIC PANEL - Abnormal; Notable for the following:    CO2 19 (*)    BUN 24 (*)    Creatinine, Ser 2.32 (*)    Calcium 8.0 (*)    GFR calc non Af Amer 27 (*)    GFR calc Af Amer 32 (*)    All  other components within normal limits  I-STAT ARTERIAL BLOOD GAS, ED - Abnormal; Notable for the following:    pCO2 arterial 31.6 (*)    Bicarbonate 18.4 (*)    Acid-base deficit 6.0 (*)    All other components within normal limits  CULTURE, BLOOD (ROUTINE X 2)  CULTURE, BLOOD (ROUTINE X 2)  MRSA PCR SCREENING  URINE CULTURE  LIPASE, BLOOD  RAPID URINE DRUG SCREEN, HOSP PERFORMED  ETHANOL  TROPONIN I  TROPONIN I  LACTIC ACID, PLASMA  PROCALCITONIN  CORTISOL  MAGNESIUM  LACTATE DEHYDROGENASE  GLUCOSE, CAPILLARY  PROCALCITONIN  HCG, QUANTITATIVE, PREGNANCY  TSH  CBC  HAPTOGLOBIN  HEPATITIS PANEL, ACUTE  PROTEIN C ACTIVITY  PROTEIN C, TOTAL  PROTEIN S ACTIVITY  PROTEIN S, TOTAL  LUPUS ANTICOAGULANT PANEL  BETA-2-GLYCOPROTEIN I ABS, IGG/M/A  HOMOCYSTEINE  FACTOR 5 LEIDEN  PROTHROMBIN GENE MUTATION  CARDIOLIPIN ANTIBODIES, IGG, IGM, IGA  CBC  FOLLICLE STIMULATING HORMONE  PROCALCITONIN  MAGNESIUM  PHOSPHORUS  HEPATIC FUNCTION PANEL  I-STAT CG4 LACTIC ACID, ED  I-STAT TROPOININ, ED  POC URINE PREG, ED  I-STAT CG4 LACTIC ACID, ED  TYPE AND SCREEN  PREPARE RBC (CROSSMATCH)  ABO/RH  PREPARE RBC (CROSSMATCH)  DIRECT ANTIGLOBULIN TEST (NOT AT [facility])    EKG  EKG Interpretation  Date/Time:  Tuesday December 13 2015 16:46:45 EDT Ventricular Rate:  127 PR Interval:  130 QRS Duration: 76 QT Interval:  278 QTC Calculation: 404 R Axis:   25 Text Interpretation:  Sinus tachycardia Nonspecific T wave abnormality No previous tracing Confirmed by Anitra Lauth  MD, WHITNEY ([id number]) on 12/13/2015 5:25:08 PM       Radiology Ct Abdomen Pelvis Wo Contrast  Result Date: 12/13/2015 CLINICAL DATA:  Initial evaluation for acute abdominal pain. Status post recent vaginal delivery. EXAM: CT ABDOMEN AND PELVIS WITHOUT CONTRAST TECHNIQUE: Multidetector CT imaging of the abdomen and pelvis was performed following the standard protocol without IV contrast. COMPARISON:  None available.  FINDINGS: Lower chest: Mild scattered atelectatic changes present within the visualized lung bases. Visualized lung bases are otherwise clear. Diffusely decreased density within the cardiac blood pool suggestive of anemia. Changes related to pregnancy noted within the bilateral breasts. Hepatobiliary: 16 mm hypodensity within the posterior right hepatic lobe present, most consistent with a cyst. Liver itself is somewhat enlarged. Limited noncontrast evaluation of the liver is otherwise unremarkable. Gallbladder within normal limits. No biliary  dilatation. Pancreas: Noncontrast evaluation the pancreas is unremarkable. Spleen: Spleen is enlarged measuring 16.2 cm in craniocaudad dimension. Adrenals/Urinary Tract: Adrenal glands are grossly unremarkable. The left kidney is somewhat prominent as compared to the right with question of mild hydronephrosis. This may be related to the ongoing pelvic process. No nephrolithiasis bilaterally. Right kidney within normal limits. No definite hydroureter. Partially distended bladder unremarkable. Stomach/Bowel: Stomach within normal limits. No evidence for bowel obstruction. No acute inflammatory changes seen about the bowels. Vascular/Lymphatic: Limited evaluation of the vascular structures on this noncontrast examination. Question enlarged periaortic lymph nodes measuring up to 15 mm (series 2, image 34). No other definite adenopathy on this noncontrast examination. Reproductive: Possible small fibroid arising from the uterine fundus noted (series 2, image 58). There are bilateral somewhat tubular cystic lesions within both adnexa, measuring 14.2 x 5.7 cm on the right and 12.6 x 6.7 cm on the left. These lesions measure low height and intermediate fluid density (approximately 20-25 Hounsfield units). Ovaries themselves not well delineated. Findings are suspected to reflect hydrosalpinges. Ovarian malignancy felt unlikely given the bilateral symmetric nature of this finding and  patient age. Other: No free intraperitoneal air. Small volume free fluid within the abdomen and pelvis. Musculoskeletal: Mild diffuse anasarca noted. No acute osseous abnormality. No worrisome lytic or blastic osseous lesions. IMPRESSION: 1. Enlarged tubular cystic structures involving the bilateral adnexa as above, suspected to reflect enlarged hydrosalpinges. These structures demonstrate low to intermediate fluid density, raising the possibility that some component of proteinaceous material (as may be expected with infection or possibly blood) may be present. Further evaluation with dedicated pelvic ultrasound may be helpful to further evaluate this finding. 2. Mild prominence of the left kidney with suspected mild left hydronephrosis. Finding may be secondary to the enlarged hydrosalpinges. Superimposed infection could also be considered. Correlation with urinalysis recommended. 3. Hepatosplenomegaly. 4. Small volume free fluid within the abdomen and pelvis with mild diffuse anasarca, suspected to be related to underlying intrinsic liver disease. 5. Enlarged periaortic adenopathy as above, not well delineated on this noncontrast examination. 6. Decreased density within the cardiac blood pool, suggesting anemia. Electronically Signed   By: Rise Mu M.D.   On: 12/13/2015 20:45   US Transvaginal Non-ob  Result Date: 12/14/2015 CLINICAL DATA:  Ultrasound was provided for use by the ordering physician, and a technical charge was applied by the performing facility.  No radiologist interpretation/professional services rendered.   US Pelvis Complete  Result Date: 12/14/2015 CLINICAL DATA:  4 weeks postpartum, severe anemia, adnexal masses on CT EXAM: TRANSABDOMINAL AND TRANSVAGINAL ULTRASOUND OF PELVIS TECHNIQUE: Both transabdominal and transvaginal ultrasound examinations of the pelvis were performed. Transabdominal technique was performed for global imaging of the pelvis including uterus, ovaries,  adnexal regions, and pelvic cul-de-sac. It was necessary to proceed with endovaginal exam following the transabdominal exam to visualize the bilateral adnexa. COMPARISON:  CT abdomen/pelvis dated 12/13/2015 FINDINGS: Uterus Measurements: 16.4 x 6.3 x 7.8 cm. No fibroids or other mass visualized. Endometrium Thickness: 17 mm.  No focal abnormality visualized. Right ovary Not discretely visualized. 14.5 x 7.3 x 8.6 cm complex cystic/tubular lesion with layering hemorrhage/debris in the right adnexa, favored to reflect hematosalpinx. Left ovary Not discretely visualized. 14.7 x 8.5 x 6.7 cm complex cystic/tubular lesion with layering hemorrhage/debris in the left adnexa, favored to reflect hematosalpinx. Other findings No abnormal free fluid. IMPRESSION: Large bilateral hematosalpinx, as above. Endometrial complex measures 17 mm, within normal limits. No gas on CT or ultrasound to suggest infection/endometritis.  Postpartum hematosalpinx can be seen in the setting of uterine scarring related to instrumentation or infection, but it is unclear that either of those scenarios applying to this patient. Additionally, given the clinical picture and CT findings, an underlying coagulopathy (possibly related to hepatic dysfunction) is suspected, although the exact etiology remains unclear on imaging. These results were called by telephone at the time of interpretation on 12/14/2015 at 11:30 am to Dr. Jonette Eva, who verbally acknowledged these results. Electronically Signed   By: Charline Bills M.D.   On: 12/14/2015 12:16   [place] Art/ven Flow Abd Pelv Doppler  Result Date: 12/14/2015 CLINICAL DATA:  Hepatomegaly. EXAM: DUPLEX ULTRASOUND OF LIVER TECHNIQUE: Color and duplex Doppler ultrasound was performed to evaluate the hepatic in-flow and out-flow vessels. COMPARISON:  Same day. FINDINGS: Portal Vein Velocities Main:  42.9 cm/sec Right:  42.2 cm/sec Left:  43.7 cm/sec Hepatopetal flow is noted. Hepatic Vein Velocities  Right:  42.2 cm/sec Middle:  54.1 cm/sec Left:  61.8 cm/sec Hepatofugal flow is noted Hepatic Artery Velocity:  155.5 cm/sec Splenic Vein Velocity:  63.6 cm/sec Varices: None. Ascites: Minimal. Moderate splenomegaly is noted with maximum measured length of 18 cm and calculated volume of 1500 cubic cm. IMPRESSION: No evidence of portal, hepatic or splenic thrombosis or occlusion. Moderate splenomegaly. Minimal ascites. Electronically Signed   By: Lupita Raider, M.D.   On: 12/14/2015 12:28   Dg Chest Portable 1 View  Result Date: 12/13/2015 CLINICAL DATA:  Shortness of breath for 2 days with central back pain EXAM: PORTABLE CHEST 1 VIEW COMPARISON:  None. FINDINGS: Cardiac shadow is at the upper limits of normal in size. The lungs are well aerated bilaterally. Mild bibasilar atelectatic changes are seen. No focal confluent infiltrate is noted. No bony abnormality is seen. IMPRESSION: Mild bibasilar atelectatic changes. Electronically Signed   By: Alcide Clever M.D.   On: 12/13/2015 18:08   US Abdomen Limited Ruq  Result Date: 12/14/2015 CLINICAL DATA:  Hepatomegaly. EXAM: US ABDOMEN LIMITED - RIGHT UPPER QUADRANT COMPARISON:  CT scan of December 13, 2015. FINDINGS: Gallbladder: No gallstones are noted. Possible sludge is noted within gallbladder lumen. Moderate gallbladder wall thickening is noted measured at 5.8 mm. No pericholecystic fluid or sonographic Murphy's sign is noted. Common bile duct: Diameter: 2.5 mm which is within normal limits. Liver: 1.5 cm simple cyst is seen in right hepatic lobe. Liver appears to be enlarged. Within normal limits in parenchymal echogenicity. IMPRESSION: Probable sludge seen within gallbladder lumen with moderate gallbladder wall thickening. This may be due to adjacent hepatocellular disease. No gallstones are noted. Liver appears to be enlarged. Electronically Signed   By: Lupita Raider, M.D.   On: 12/14/2015 11:52    Procedures Procedures (including critical care  time)  Medications Ordered in ED Medications  famotidine (PEPCID) IVPB 20 mg premix (not administered)  vancomycin (VANCOCIN) IVPB 750 mg/150 ml premix (not administered)  piperacillin-tazobactam (ZOSYN) IVPB 3.375 g (3.375 g Intravenous Given 12/14/15 1438)  phenylephrine (NEO-SYNEPHRINE) 10 mg in dextrose 5 % 250 mL (0.04 mg/mL) infusion (not administered)  fentaNYL (SUBLIMAZE) injection 12.5-25 mcg (25 mcg Intravenous Given 12/14/15 1720)  0.9 %  sodium chloride infusion ( Intravenous New Bag/Given 12/14/15 1215)  albuterol (PROVENTIL) (2.5 MG/3ML) 0.083% nebulizer solution 5 mg (5 mg Nebulization Given 12/13/15 1839)  0.9 %  sodium chloride infusion ( Intravenous Stopped 12/14/15 0421)  sodium chloride 0.9 % bolus 1,000 mL (0 mLs Intravenous Stopped 12/13/15 1757)  ondansetron (ZOFRAN) injection 4 mg (  4 mg Intravenous Given 12/13/15 1936)  cefTRIAXone (ROCEPHIN) 1 g in dextrose 5 % 50 mL IVPB (0 g Intravenous Stopped 12/13/15 2249)  acetaminophen (TYLENOL) tablet 1,000 mg (1,000 mg Oral Given 12/13/15 2249)  vancomycin (VANCOCIN) 1,250 mg in sodium chloride 0.9 % 250 mL IVPB (1,250 mg Intravenous Transfusing/Transfer 12/14/15 0420)  piperacillin-tazobactam (ZOSYN) IVPB 3.375 g (0 g Intravenous Stopped 12/14/15 0114)  sodium chloride 0.9 % bolus 1,000 mL (0 mLs Intravenous Stopped 12/14/15 0224)  0.9 %  sodium chloride infusion ( Intravenous Transfusing/Transfer 12/14/15 0421)  HYDROmorphone (DILAUDID) injection 0.5 mg (0.5 mg Intravenous Given 12/14/15 0655)     Initial Impression / Assessment and Plan / ED Course  I have reviewed the triage vital signs and the nursing notes.  Pertinent labs & imaging results that were available during my care of the patient were reviewed by me and considered in my medical decision making (see chart for details).  Clinical Course    Patient presents with several days of upper back pain and abdominal pain, is four weeks postpartum from a term spontaneous  vaginal delivery. On arrival she is tachycardic to the 130s and hypotensive to the 704/40s, protecting her airway with normal mental status. Modified RUSH exam performed for evaluation of hypotension which revealed no pericardial effusion, normal RV size without dilation or signs of strain, and grossly normal cardiac function. Labs reveal hemoglobin of 4.9, possibly due to slow postpartum vaginal bleeding though she states only spotting the last four weeks without profuse bleeding now. Denies GI sources of bleeding. She is afebrile but has leuks/nitrites in urine so was covered with Rocephin for pyelonephritis. CXR unremarkable.  Initially given 2 units of crystalloids then 2U PRBCs for hypotension. Labs reveal mildly elevated INR and creatinine of 2.5, no baseline for comparison but presumed normal as she is otherwise healthy. CT abdomen/pelvis with bilateral hydrosalpinx and mild hydronephrosis with bilateral adnexal cystic structures. OB/GYN recommended pelvic ultrasound when stable and covering broadly for possible endometritis, so she was started on vanc and zosyn. Critical care consulted as her pressures began to dip again after 2U NS and 2U PRBCs. Will give another 1L NS and recheck a CBC, BMP, lactate. Will likely admit to critical care if continues to be hypotensive versus stepdown unit.  Final Clinical Impressions(s) / ED Diagnoses   Final diagnoses:  Sepsis, due to unspecified organism (HCC)  Anemia, unspecified type    New Prescriptions Current Discharge Medication List       Preston Fleeting, MD 12/14/15 1817    Gwyneth Sprout, MD 12/14/15 2310

## 2015-12-13 NOTE — ED Notes (Addendum)
Will hold next blood transfusion until cbc results per er md

## 2015-12-13 NOTE — ED Triage Notes (Signed)
Pt states she had sudden onset of back pain last night. Pt has been feeling SOB for a few days. Pt recently had vaginal delivery on Oct. 1st. Denies problems during pregnancy and immediately postpartum. Pt having abd cramps.

## 2015-12-13 NOTE — ED Notes (Signed)
MD at bedside. 

## 2015-12-13 NOTE — ED Notes (Signed)
Pt began vomiting, er md notified and nausea medication ordered, patient denies any itching, increased shortness of breath or any new pain. Per er md will continue blood transfusion

## 2015-12-13 NOTE — ED Triage Notes (Signed)
Pt's BP 70's systolic, pt states she feels SOB And lightheaded.

## 2015-12-14 ENCOUNTER — Inpatient Hospital Stay (HOSPITAL_COMMUNITY): Payer: Medicaid Other

## 2015-12-14 DIAGNOSIS — A419 Sepsis, unspecified organism: Secondary | ICD-10-CM | POA: Diagnosis present

## 2015-12-14 DIAGNOSIS — R5081 Fever presenting with conditions classified elsewhere: Secondary | ICD-10-CM | POA: Diagnosis not present

## 2015-12-14 DIAGNOSIS — R17 Unspecified jaundice: Secondary | ICD-10-CM | POA: Diagnosis not present

## 2015-12-14 DIAGNOSIS — N179 Acute kidney failure, unspecified: Secondary | ICD-10-CM | POA: Diagnosis not present

## 2015-12-14 DIAGNOSIS — N7011 Chronic salpingitis: Secondary | ICD-10-CM | POA: Diagnosis present

## 2015-12-14 DIAGNOSIS — R162 Hepatomegaly with splenomegaly, not elsewhere classified: Secondary | ICD-10-CM | POA: Diagnosis present

## 2015-12-14 DIAGNOSIS — O904 Postpartum acute kidney failure: Secondary | ICD-10-CM | POA: Diagnosis present

## 2015-12-14 DIAGNOSIS — R188 Other ascites: Secondary | ICD-10-CM | POA: Diagnosis present

## 2015-12-14 DIAGNOSIS — N1330 Unspecified hydronephrosis: Secondary | ICD-10-CM | POA: Diagnosis not present

## 2015-12-14 DIAGNOSIS — O85 Puerperal sepsis: Secondary | ICD-10-CM | POA: Diagnosis present

## 2015-12-14 DIAGNOSIS — R579 Shock, unspecified: Secondary | ICD-10-CM

## 2015-12-14 DIAGNOSIS — D509 Iron deficiency anemia, unspecified: Secondary | ICD-10-CM | POA: Diagnosis present

## 2015-12-14 DIAGNOSIS — R6521 Severe sepsis with septic shock: Secondary | ICD-10-CM | POA: Diagnosis present

## 2015-12-14 DIAGNOSIS — N342 Other urethritis: Secondary | ICD-10-CM | POA: Diagnosis not present

## 2015-12-14 DIAGNOSIS — B961 Klebsiella pneumoniae [K. pneumoniae] as the cause of diseases classified elsewhere: Secondary | ICD-10-CM | POA: Diagnosis present

## 2015-12-14 DIAGNOSIS — N836 Hematosalpinx: Secondary | ICD-10-CM | POA: Diagnosis not present

## 2015-12-14 DIAGNOSIS — N949 Unspecified condition associated with female genital organs and menstrual cycle: Secondary | ICD-10-CM | POA: Diagnosis not present

## 2015-12-14 DIAGNOSIS — I5041 Acute combined systolic (congestive) and diastolic (congestive) heart failure: Secondary | ICD-10-CM | POA: Diagnosis present

## 2015-12-14 DIAGNOSIS — R791 Abnormal coagulation profile: Secondary | ICD-10-CM | POA: Insufficient documentation

## 2015-12-14 DIAGNOSIS — N10 Acute tubulo-interstitial nephritis: Secondary | ICD-10-CM

## 2015-12-14 DIAGNOSIS — I5021 Acute systolic (congestive) heart failure: Secondary | ICD-10-CM | POA: Diagnosis not present

## 2015-12-14 DIAGNOSIS — E872 Acidosis: Secondary | ICD-10-CM | POA: Diagnosis present

## 2015-12-14 DIAGNOSIS — N1339 Other hydronephrosis: Secondary | ICD-10-CM | POA: Diagnosis not present

## 2015-12-14 DIAGNOSIS — D649 Anemia, unspecified: Secondary | ICD-10-CM | POA: Diagnosis not present

## 2015-12-14 DIAGNOSIS — N136 Pyonephrosis: Secondary | ICD-10-CM | POA: Diagnosis present

## 2015-12-14 DIAGNOSIS — N9489 Other specified conditions associated with female genital organs and menstrual cycle: Secondary | ICD-10-CM | POA: Insufficient documentation

## 2015-12-14 DIAGNOSIS — E876 Hypokalemia: Secondary | ICD-10-CM | POA: Diagnosis present

## 2015-12-14 DIAGNOSIS — K5900 Constipation, unspecified: Secondary | ICD-10-CM | POA: Diagnosis present

## 2015-12-14 DIAGNOSIS — O9089 Other complications of the puerperium, not elsewhere classified: Secondary | ICD-10-CM | POA: Diagnosis present

## 2015-12-14 DIAGNOSIS — Z9289 Personal history of other medical treatment: Secondary | ICD-10-CM

## 2015-12-14 DIAGNOSIS — R0600 Dyspnea, unspecified: Secondary | ICD-10-CM | POA: Diagnosis not present

## 2015-12-14 DIAGNOSIS — R5383 Other fatigue: Secondary | ICD-10-CM | POA: Diagnosis not present

## 2015-12-14 DIAGNOSIS — E861 Hypovolemia: Secondary | ICD-10-CM | POA: Diagnosis present

## 2015-12-14 DIAGNOSIS — O2663 Liver and biliary tract disorders in the puerperium: Secondary | ICD-10-CM | POA: Diagnosis present

## 2015-12-14 DIAGNOSIS — R509 Fever, unspecified: Secondary | ICD-10-CM | POA: Diagnosis not present

## 2015-12-14 DIAGNOSIS — O903 Peripartum cardiomyopathy: Secondary | ICD-10-CM | POA: Diagnosis present

## 2015-12-14 DIAGNOSIS — O9081 Anemia of the puerperium: Secondary | ICD-10-CM | POA: Diagnosis present

## 2015-12-14 DIAGNOSIS — R570 Cardiogenic shock: Secondary | ICD-10-CM | POA: Diagnosis present

## 2015-12-14 DIAGNOSIS — R578 Other shock: Secondary | ICD-10-CM | POA: Diagnosis present

## 2015-12-14 DIAGNOSIS — D508 Other iron deficiency anemias: Secondary | ICD-10-CM | POA: Diagnosis not present

## 2015-12-14 DIAGNOSIS — R160 Hepatomegaly, not elsewhere classified: Secondary | ICD-10-CM | POA: Diagnosis not present

## 2015-12-14 HISTORY — DX: Personal history of other medical treatment: Z92.89

## 2015-12-14 LAB — BASIC METABOLIC PANEL
ANION GAP: 10 (ref 5–15)
ANION GAP: 10 (ref 5–15)
Anion gap: 9 (ref 5–15)
BUN: 24 mg/dL — AB (ref 6–20)
BUN: 24 mg/dL — AB (ref 6–20)
BUN: 24 mg/dL — AB (ref 6–20)
CHLORIDE: 110 mmol/L (ref 101–111)
CHLORIDE: 111 mmol/L (ref 101–111)
CO2: 17 mmol/L — AB (ref 22–32)
CO2: 19 mmol/L — AB (ref 22–32)
CO2: 19 mmol/L — AB (ref 22–32)
Calcium: 7.4 mg/dL — ABNORMAL LOW (ref 8.9–10.3)
Calcium: 7.8 mg/dL — ABNORMAL LOW (ref 8.9–10.3)
Calcium: 8 mg/dL — ABNORMAL LOW (ref 8.9–10.3)
Chloride: 111 mmol/L (ref 101–111)
Creatinine, Ser: 2.41 mg/dL — ABNORMAL HIGH (ref 0.44–1.00)
Creatinine, Ser: 2.39 mg/dL — ABNORMAL HIGH (ref 0.44–1.00)
Creatinine, Ser: 2.32 mg/dL — ABNORMAL HIGH (ref 0.44–1.00)
GFR calc Af Amer: 30 mL/min — ABNORMAL LOW (ref >60)
GFR calc Af Amer: 30 mL/min — ABNORMAL LOW (ref >60)
GFR calc Af Amer: 32 mL/min — ABNORMAL LOW (ref >60)
GFR calc non Af Amer: 26 mL/min — ABNORMAL LOW (ref >60)
GFR calc non Af Amer: 26 mL/min — ABNORMAL LOW (ref >60)
GFR calc non Af Amer: 27 mL/min — ABNORMAL LOW (ref >60)
GLUCOSE: 83 mg/dL (ref 65–99)
GLUCOSE: 93 mg/dL (ref 65–99)
GLUCOSE: 93 mg/dL (ref 65–99)
POTASSIUM: 3.8 mmol/L (ref 3.5–5.1)
POTASSIUM: 4 mmol/L (ref 3.5–5.1)
POTASSIUM: 4 mmol/L (ref 3.5–5.1)
Sodium: 138 mmol/L (ref 135–145)
Sodium: 139 mmol/L (ref 135–145)
Sodium: 139 mmol/L (ref 135–145)

## 2015-12-14 LAB — CBC
HCT: 14.9 % — ABNORMAL LOW (ref 36.0–46.0)
HCT: 25.8 % — ABNORMAL LOW (ref 36.0–46.0)
HEMATOCRIT: 15.7 % — AB (ref 36.0–46.0)
HEMATOCRIT: 20.4 % — AB (ref 36.0–46.0)
HEMATOCRIT: 25.2 % — AB (ref 36.0–46.0)
HEMOGLOBIN: 8.2 g/dL — AB (ref 12.0–15.0)
Hemoglobin: 5.2 g/dL — CL (ref 12.0–15.0)
Hemoglobin: 6.8 g/dL — CL (ref 12.0–15.0)
Hemoglobin: 4.9 g/dL — CL (ref 12.0–15.0)
Hemoglobin: 8.5 g/dL — ABNORMAL LOW (ref 12.0–15.0)
MCH: 24.4 pg — AB (ref 26.0–34.0)
MCH: 25 pg — AB (ref 26.0–34.0)
MCH: 25 pg — AB (ref 26.0–34.0)
MCH: 25.3 pg — AB (ref 26.0–34.0)
MCH: 25.4 pg — AB (ref 26.0–34.0)
MCHC: 33.1 g/dL (ref 30.0–36.0)
MCHC: 32.5 g/dL (ref 30.0–36.0)
MCHC: 33.3 g/dL (ref 30.0–36.0)
MCHC: 32.9 g/dL (ref 30.0–36.0)
MCHC: 32.9 g/dL (ref 30.0–36.0)
MCV: 74.1 fL — AB (ref 78.0–100.0)
MCV: 75.5 fL — AB (ref 78.0–100.0)
MCV: 76.1 fL — AB (ref 78.0–100.0)
MCV: 76.8 fL — AB (ref 78.0–100.0)
MCV: 76.8 fL — ABNORMAL LOW (ref 78.0–100.0)
PLATELETS: 151 10*3/uL (ref 150–400)
PLATELETS: 171 10*3/uL (ref 150–400)
PLATELETS: 200 10*3/uL (ref 150–400)
Platelets: 179 10*3/uL (ref 150–400)
Platelets: 195 10*3/uL (ref 150–400)
RBC: 2.01 MIL/uL — ABNORMAL LOW (ref 3.87–5.11)
RBC: 2.08 MIL/uL — AB (ref 3.87–5.11)
RBC: 2.68 MIL/uL — ABNORMAL LOW (ref 3.87–5.11)
RBC: 3.28 MIL/uL — ABNORMAL LOW (ref 3.87–5.11)
RBC: 3.36 MIL/uL — ABNORMAL LOW (ref 3.87–5.11)
RDW: 17.8 % — ABNORMAL HIGH (ref 11.5–15.5)
RDW: 17.2 % — AB (ref 11.5–15.5)
RDW: 17.3 % — AB (ref 11.5–15.5)
RDW: 17.4 % — AB (ref 11.5–15.5)
RDW: 18.5 % — AB (ref 11.5–15.5)
WBC: 10.9 10*3/uL — AB (ref 4.0–10.5)
WBC: 12.2 10*3/uL — AB (ref 4.0–10.5)
WBC: 14.2 10*3/uL — AB (ref 4.0–10.5)
WBC: 14.2 10*3/uL — ABNORMAL HIGH (ref 4.0–10.5)
WBC: 15.8 10*3/uL — AB (ref 4.0–10.5)

## 2015-12-14 LAB — DIC (DISSEMINATED INTRAVASCULAR COAGULATION) PANEL
APTT: 47 s — AB (ref 24–36)
D DIMER QUANT: 7.21 ug{FEU}/mL — AB (ref 0.00–0.50)
FIBRINOGEN: 491 mg/dL — AB (ref 210–475)
PLATELETS: 173 10*3/uL (ref 150–400)
SMEAR REVIEW: NONE SEEN

## 2015-12-14 LAB — I-STAT ARTERIAL BLOOD GAS, ED
ACID-BASE DEFICIT: 6 mmol/L — AB (ref 0.0–2.0)
BICARBONATE: 18.4 mmol/L — AB (ref 20.0–28.0)
O2 SAT: 96 %
PO2 ART: 85 mmHg (ref 83.0–108.0)
TCO2: 19 mmol/L (ref 0–100)
pCO2 arterial: 31.6 mmHg — ABNORMAL LOW (ref 32.0–48.0)
pH, Arterial: 7.372 (ref 7.350–7.450)

## 2015-12-14 LAB — PROCALCITONIN
PROCALCITONIN: 5.83 ng/mL
PROCALCITONIN: 5.82 ng/mL

## 2015-12-14 LAB — RAPID URINE DRUG SCREEN, HOSP PERFORMED
AMPHETAMINES: NOT DETECTED
Barbiturates: NOT DETECTED
Benzodiazepines: NOT DETECTED
Cocaine: NOT DETECTED
OPIATES: NOT DETECTED
Tetrahydrocannabinol: NOT DETECTED

## 2015-12-14 LAB — GLUCOSE, CAPILLARY: Glucose-Capillary: 78 mg/dL (ref 65–99)

## 2015-12-14 LAB — LACTIC ACID, PLASMA: Lactic Acid, Venous: 0.9 mmol/L (ref 0.5–1.9)

## 2015-12-14 LAB — HCG, QUANTITATIVE, PREGNANCY: hCG, Beta Chain, Quant, S: <1 m[IU]/mL (ref <5)

## 2015-12-14 LAB — MAGNESIUM: Magnesium: 1.7 mg/dL (ref 1.7–2.4)

## 2015-12-14 LAB — ETHANOL

## 2015-12-14 LAB — PHOSPHORUS: Phosphorus: 6 mg/dL — ABNORMAL HIGH (ref 2.5–4.6)

## 2015-12-14 LAB — ANTITHROMBIN III: AntiThromb III Func: 54 % — ABNORMAL LOW (ref 75–120)

## 2015-12-14 LAB — TROPONIN I
TROPONIN I: 0.03 ng/mL — AB (ref <0.03)
Troponin I: <0.03 ng/mL (ref <0.03)
Troponin I: <0.03 ng/mL (ref <0.03)

## 2015-12-14 LAB — RETICULOCYTES
RBC.: 2.63 MIL/uL — AB (ref 3.87–5.11)
RETIC CT PCT: 2.7 % (ref 0.4–3.1)
Retic Count, Absolute: 71 10*3/uL (ref 19.0–186.0)

## 2015-12-14 LAB — DIRECT ANTIGLOBULIN TEST (NOT AT ARMC)
DAT, IGG: NEGATIVE
DAT, complement: NEGATIVE

## 2015-12-14 LAB — MRSA PCR SCREENING: MRSA BY PCR: NEGATIVE

## 2015-12-14 LAB — TSH: TSH: 3.534 u[IU]/mL (ref 0.350–4.500)

## 2015-12-14 LAB — I-STAT CG4 LACTIC ACID, ED: Lactic Acid, Venous: 0.66 mmol/L (ref 0.5–1.9)

## 2015-12-14 LAB — BILIRUBIN, FRACTIONATED(TOT/DIR/INDIR)
BILIRUBIN DIRECT: 2.1 mg/dL — AB (ref 0.1–0.5)
Indirect Bilirubin: 1.1 mg/dL — ABNORMAL HIGH (ref 0.3–0.9)
Total Bilirubin: 3.2 mg/dL — ABNORMAL HIGH (ref 0.3–1.2)

## 2015-12-14 LAB — DIC (DISSEMINATED INTRAVASCULAR COAGULATION)PANEL
INR: 1.44
Prothrombin Time: 17.7 seconds — ABNORMAL HIGH (ref 11.4–15.2)

## 2015-12-14 LAB — CORTISOL: CORTISOL PLASMA: 19.8 ug/dL

## 2015-12-14 LAB — LACTATE DEHYDROGENASE: LDH: 184 U/L (ref 98–192)

## 2015-12-14 LAB — PREPARE RBC (CROSSMATCH)

## 2015-12-14 MED ORDER — VANCOMYCIN HCL IN DEXTROSE 750-5 MG/150ML-% IV SOLN
750.0000 mg | INTRAVENOUS | Status: DC
  Administered 2015-12-15 – 2015-12-16 (×2): 750 mg via INTRAVENOUS
  Filled 2015-12-14 (×2): qty 150

## 2015-12-14 MED ORDER — FENTANYL CITRATE (PF) 100 MCG/2ML IJ SOLN
12.5000 ug | INTRAMUSCULAR | Status: DC | PRN
  Administered 2015-12-14 (×3): 25 ug via INTRAVENOUS
  Filled 2015-12-14 (×3): qty 2

## 2015-12-14 MED ORDER — FAMOTIDINE IN NACL 20-0.9 MG/50ML-% IV SOLN
20.0000 mg | INTRAVENOUS | Status: DC
  Administered 2015-12-15: 20 mg via INTRAVENOUS
  Filled 2015-12-14 (×2): qty 50

## 2015-12-14 MED ORDER — PHENYLEPHRINE HCL 10 MG/ML IJ SOLN
30.0000 ug/min | INTRAVENOUS | Status: DC
  Filled 2015-12-14: qty 1

## 2015-12-14 MED ORDER — PIPERACILLIN-TAZOBACTAM 3.375 G IVPB 30 MIN
3.3750 g | Freq: Once | INTRAVENOUS | Status: AC
  Administered 2015-12-14: 3.375 g via INTRAVENOUS
  Filled 2015-12-14: qty 50

## 2015-12-14 MED ORDER — PIPERACILLIN-TAZOBACTAM 3.375 G IVPB
3.3750 g | Freq: Three times a day (TID) | INTRAVENOUS | Status: DC
  Administered 2015-12-14 – 2015-12-16 (×7): 3.375 g via INTRAVENOUS
  Filled 2015-12-14 (×9): qty 50

## 2015-12-14 MED ORDER — FENTANYL CITRATE (PF) 100 MCG/2ML IJ SOLN
25.0000 ug | INTRAMUSCULAR | Status: DC | PRN
  Administered 2015-12-14 – 2015-12-15 (×9): 50 ug via INTRAVENOUS
  Filled 2015-12-14 (×9): qty 2

## 2015-12-14 MED ORDER — SODIUM CHLORIDE 0.9 % IV BOLUS (SEPSIS)
1000.0000 mL | Freq: Once | INTRAVENOUS | Status: AC
  Administered 2015-12-14: 1000 mL via INTRAVENOUS

## 2015-12-14 MED ORDER — SODIUM CHLORIDE 0.9 % IV SOLN
Freq: Once | INTRAVENOUS | Status: AC
  Administered 2015-12-14: 04:00:00 via INTRAVENOUS

## 2015-12-14 MED ORDER — SODIUM CHLORIDE 0.9 % IV SOLN
INTRAVENOUS | Status: DC
  Administered 2015-12-14 – 2015-12-15 (×3): via INTRAVENOUS

## 2015-12-14 MED ORDER — VANCOMYCIN HCL 10 G IV SOLR
1250.0000 mg | Freq: Once | INTRAVENOUS | Status: AC
  Administered 2015-12-14: 1250 mg via INTRAVENOUS
  Filled 2015-12-14: qty 1250

## 2015-12-14 MED ORDER — SODIUM CHLORIDE 0.9 % IV SOLN: 250.0000 mL | INTRAVENOUS | Status: DC | PRN

## 2015-12-14 MED ORDER — HYDROMORPHONE HCL 1 MG/ML IJ SOLN
0.5000 mg | Freq: Once | INTRAMUSCULAR | Status: AC
  Administered 2015-12-14: 0.5 mg via INTRAVENOUS
  Filled 2015-12-14: qty 1

## 2015-12-14 NOTE — Progress Notes (Signed)
eLink Physician-Brief Progress Note Patient Name: Carolyn Lynn DOB: 12/21/1985 MRN: [id number]   Date of Service  12/14/2015  HPI/Events of Note    eICU Interventions  Fentanyl dose increased     Intervention Category Intermediate Interventions: Pain - evaluation and management  [name] 12/14/2015, 6:46 PM

## 2015-12-14 NOTE — Consult Note (Addendum)
[facility]  Impression: Principal Problem:   Shock [facility]) Active Problems:   AKI (acute kidney injury) (HCC)   Jaundice   Hepatosplenomegaly   Anemia, iron deficiency   Mass of fallopian tube   Abnormal coagulation profile  I have reviewed the images with Radiology and they believe the patient has bilateral hemato-salpinges. So is this anemia related to her tubes and her postpartum? According to patient, she has not had heavy bleeding postpartum, but certainly could be an issue and she is underestimating how much she bled. She appears to have a bleeding diathesis, and did this cause hemorrhage into her tubes or did her bleeding cause a bleeding diathesis. Of note her Hgb was 7.8 on presentation of her new OB exam.  I am less concerned with endometritis given her only mildly elevated WBC and enlarged tubes but no signs of infection on CT and her thin endometrial stripe. She would most likely be in more pain, have fever. Additionally, the patient denies fever at home. She did have a UTI confirmed 12/04/2015 but a negative urine culture 12/11/15.   AKI - is this just pre-renal from shock or some other systemic illness vs. Mild hydronephrosis   Hepatosplenomegaly - is this normal postpartum variant vs. More systemic illness  Jaundice - not sure how this fits.  Abnormal bleeding profile - again, is this related to ABL and volume depletion?  Remains on pressure support  Recommendations: Although I am struggling with the diagnostic complexity of this case, I agree with transfusion of PRBC's to keep Hgb > 7.5, may consider FFP/cryo if coags do not improve.  Have discussed with IR and several of my partners, we do not believe that drainage of the tubes is indicated at this time. If hydronephrosis is worrisome, ureteral stenting with Urology would be the best idea.  Additionally, agree with continued broad spectrum antibiotics until cultures are negative for  possible postpartum endometritis.  I really worry about something systemic given all of her findings on CT and abnormal lab findings. Will Check BHCG, TSH, FSH to look for choriocarcinoma. Additional considerations include postpartum OHSS, which has been case reported in the literature, although would be uncommon.  Reason for consult: Patient is a 30 y.o. G72P1001 female who was admitted with hemorrhagic shock. Patient is 4 wks postpartum from an uncomplicated SVD 11/13/2015 following routine PNC. New OB labs WNL except for initial Hgb of 7.8. Also noted to have ovarian cyst on left on U/S at the [facility] Patient reports minimal bleeding, which stopped 2 wks ago. She has been into the ED and MAU since delivery with pain and fatigue. Found and treated for UTI on 10/22 and grew Klebsiella, treated with negative repeat culture on 10/29. She presented to the ED on 10/31 for SOB, fatigue, feet swelling and abdominal distension. Her hgb noted to be 4.9, AKI, elevated bilirubin, abnormal coags and mildly elevated BNP noted. CT showed bilaterally enlarged tubes and mild hepatomegaly with left hydronephrosis. Patient found to be hypotensive and tachycardic. We are asked to see the patient regarding postpartum state, possible postpartum hemorrhage or possible endometritis.  Past Medical History:  Diagnosis Date  . Medical history non-contributory     Past Surgical History:  Procedure Laterality Date  . NO PAST SURGERIES      History reviewed. No pertinent family history.  Social History   Social History  . Marital status: Single    Spouse name: N/A  . Number of children: N/A  .  Years of education: N/A   Occupational History  . Not on file.   Social History Main Topics  . Smoking status: Never Smoker  . Smokeless tobacco: Never Used  . Alcohol use No  . Drug use: No  . Sexual activity: Yes    Birth control/ protection: None   Other Topics Concern  . Not on file   Social History Narrative  .  No narrative on file    . famotidine (PEPCID) IV  20 mg Intravenous Q24H  . piperacillin-tazobactam (ZOSYN)  IV  3.375 g Intravenous Q8H  . [START ON 12/15/2015] vancomycin  750 mg Intravenous Q24H    No Known Allergies  Review of Systems Bolds are positive  Constitutional: weight loss, gain, night sweats, Fevers, chills, fatigue .  HEENT: headaches, Sore throat, sneezing, nasal congestion, post nasal drip, Difficulty swallowing, Tooth/dental problems, visual complaints visual changes, ear ache CV:  chest pain, radiates: ,Orthopnea, PND, swelling in lower extremities, dizziness, palpitations, syncope.  GI  heartburn, indigestion, abdominal pain, nausea, vomiting, diarrhea, change in bowel habits, loss of appetite, bloody stools.  Resp: cough, productive: , hemoptysis, dyspnea, chest pain, pleuritic.  Skin: rash or itching or icterus GU: dysuria, change in color of urine, urgency or frequency. flank pain, hematuria  MS: joint pain or swelling. decreased range of motion  Psych: change in mood or affect. depression or anxiety.  Neuro: difficulty with speech, weakness, numbness, ataxia   Exam Vitals:   12/14/15 0900 12/14/15 1058  BP: (!) 86/58   Pulse: 85   Resp: (!) 26   Temp:  (P) 98.5 F (36.9 C)   Physical Examination: General appearance - alert, well appearing, and in no distress Mental status - alert, oriented to person, place, and time Neck - supple, no significant adenopathy Chest - normal effort Heart - normal rate and regular rhythm Abdomen - soft, protuberant abdomen, diffuse tenderness Pelvic - cervix is closed and there is diffuse tenderness Back exam - no CVA tenderness Musculoskeletal - no joint tenderness, deformity or swelling Extremities - peripheral pulses normal, no pedal edema, no clubbing or cyanosis Skin - normal coloration and turgor, no rashes, no suspicious skin lesions noted  Labs:  CBC    Component Value Date/Time   WBC 12.2 (H) 12/14/2015  0711   RBC 2.63 (L) 12/14/2015 0711   RBC 2.68 (L) 12/14/2015 0711   HGB 6.8 (LL) 12/14/2015 0711   HCT 20.4 (L) 12/14/2015 0711   PLT 173 12/14/2015 0711   PLT 179 12/14/2015 0711   MCV 76.1 (L) 12/14/2015 0711   MCH 25.4 (L) 12/14/2015 0711   MCHC 33.3 12/14/2015 0711   RDW 17.3 (H) 12/14/2015 0711    CMP     Component Value Date/Time   NA 139 12/14/2015 0711   K 3.8 12/14/2015 0711   CL 110 12/14/2015 0711   CO2 19 (L) 12/14/2015 0711   GLUCOSE 83 12/14/2015 0711   BUN 24 (H) 12/14/2015 0711   CREATININE 2.39 (H) 12/14/2015 0711   CALCIUM 7.8 (L) 12/14/2015 0711   PROT 5.6 (L) 12/13/2015 1722   ALBUMIN 1.4 (L) 12/13/2015 1722   AST 22 12/13/2015 1722   ALT 18 12/13/2015 1722   ALKPHOS 84 12/13/2015 1722   BILITOT 3.2 (H) 12/14/2015 0711   GFRNONAA 26 (L) 12/14/2015 0711   GFRAA 30 (L) 12/14/2015 0711    No results found for: TSH  No components found for: URINALYSIS  Lab Results  Component Value Date   PREGTESTUR NEGATIVE  12/13/2015    Radiological Studies Ct Abdomen Pelvis Wo Contrast  Result Date: 12/13/2015 CLINICAL DATA:  Initial evaluation for acute abdominal pain. Status post recent vaginal delivery. EXAM: CT ABDOMEN AND PELVIS WITHOUT CONTRAST TECHNIQUE: Multidetector CT imaging of the abdomen and pelvis was performed following the standard protocol without IV contrast. COMPARISON:  None available. FINDINGS: Lower chest: Mild scattered atelectatic changes present within the visualized lung bases. Visualized lung bases are otherwise clear. Diffusely decreased density within the cardiac blood pool suggestive of anemia. Changes related to pregnancy noted within the bilateral breasts. Hepatobiliary: 16 mm hypodensity within the posterior right hepatic lobe present, most consistent with a cyst. Liver itself is somewhat enlarged. Limited noncontrast evaluation of the liver is otherwise unremarkable. Gallbladder within normal limits. No biliary dilatation. Pancreas:  Noncontrast evaluation the pancreas is unremarkable. Spleen: Spleen is enlarged measuring 16.2 cm in craniocaudad dimension. Adrenals/Urinary Tract: Adrenal glands are grossly unremarkable. The left kidney is somewhat prominent as compared to the right with question of mild hydronephrosis. This may be related to the ongoing pelvic process. No nephrolithiasis bilaterally. Right kidney within normal limits. No definite hydroureter. Partially distended bladder unremarkable. Stomach/Bowel: Stomach within normal limits. No evidence for bowel obstruction. No acute inflammatory changes seen about the bowels. Vascular/Lymphatic: Limited evaluation of the vascular structures on this noncontrast examination. Question enlarged periaortic lymph nodes measuring up to 15 mm (series 2, image 34). No other definite adenopathy on this noncontrast examination. Reproductive: Possible small fibroid arising from the uterine fundus noted (series 2, image 58). There are bilateral somewhat tubular cystic lesions within both adnexa, measuring 14.2 x 5.7 cm on the right and 12.6 x 6.7 cm on the left. These lesions measure low height and intermediate fluid density (approximately 20-25 Hounsfield units). Ovaries themselves not well delineated. Findings are suspected to reflect hydrosalpinges. Ovarian malignancy felt unlikely given the bilateral symmetric nature of this finding and patient age. Other: No free intraperitoneal air. Small volume free fluid within the abdomen and pelvis. Musculoskeletal: Mild diffuse anasarca noted. No acute osseous abnormality. No worrisome lytic or blastic osseous lesions. IMPRESSION: 1. Enlarged tubular cystic structures involving the bilateral adnexa as above, suspected to reflect enlarged hydrosalpinges. These structures demonstrate low to intermediate fluid density, raising the possibility that some component of proteinaceous material (as may be expected with infection or possibly blood) may be present.  Further evaluation with dedicated pelvic ultrasound may be helpful to further evaluate this finding. 2. Mild prominence of the left kidney with suspected mild left hydronephrosis. Finding may be secondary to the enlarged hydrosalpinges. Superimposed infection could also be considered. Correlation with urinalysis recommended. 3. Hepatosplenomegaly. 4. Small volume free fluid within the abdomen and pelvis with mild diffuse anasarca, suspected to be related to underlying intrinsic liver disease. 5. Enlarged periaortic adenopathy as above, not well delineated on this noncontrast examination. 6. Decreased density within the cardiac blood pool, suggesting anemia. Electronically Signed   By: Rise MuBenjamin  McClintock M.D.   On: 12/13/2015 20:45   Dg Chest Portable 1 View  Result Date: 12/13/2015 CLINICAL DATA:  Shortness of breath for 2 days with central back pain EXAM: PORTABLE CHEST 1 VIEW COMPARISON:  None. FINDINGS: Cardiac shadow is at the upper limits of normal in size. The lungs are well aerated bilaterally. Mild bibasilar atelectatic changes are seen. No focal confluent infiltrate is noted. No bony abnormality is seen. IMPRESSION: Mild bibasilar atelectatic changes. Electronically Signed   By: Alcide CleverMark  Lukens M.D.   On: 12/13/2015 18:08  Thank you so much for allowing us to participate in the care of this patient.  We will continue to follow with you. Please call the attending OB/GYN physician with questions or concerns at [phone].

## 2015-12-14 NOTE — Care Management Note (Signed)
Case Management Note  Patient Details  Name: Carolyn Lynn MRN: [id number] Date of Birth: 10/09/1985  Subjective/Objective:        Pt admitted for shock post partum, c/o abdominal pain, back pain, and fatigue. Urinalysis again consistent with urinary tract infection.             Action/Plan:  PTA from home independent with husband.  Pt had baby a month  ago.  CM will continue to follow for discharge needs   Expected Discharge Date:                  Expected Discharge Plan:  Home/Self Care  In-House Referral:     Discharge planning Services  CM Consult  Post Acute Care Choice:    Choice offered to:     DME Arranged:    DME Agency:     HH Arranged:    HH Agency:     Status of Service:  In process, will continue to follow  If discussed at Long Length of Stay Meetings, dates discussed:    Additional Comments:  Cherylann ParrClaxton, Tanyia Grabbe S, RN 12/14/2015, 2:13 PM

## 2015-12-14 NOTE — ED Notes (Signed)
Per EDP, hold third unit of blood until completion of bolus and lab draw.

## 2015-12-14 NOTE — Progress Notes (Signed)
PULMONARY / CRITICAL CARE MEDICINE   Name: Carolyn Lynn MRN: [id number] DOB: 07/19/1985    ADMISSION DATE:  12/13/2015 CONSULTATION DATE:  12/14/2015  REFERRING MD:  Dr. Anitra Lauth EDP  CHIEF COMPLAINT:  Abd pain   BRIEF 30 year old female G1 P1 who is one month postpartum from an uncomplicated pregnancy, uncomplicated delivery, and uncomplicated postpartum course per her discharge summary from [facility] on 10/3. Of note, at time of discharge systolic blood pressure ranged from 90-103.She presented to the emergency department on 10/22 with complaints of fatigue, abdominal pain, and left-sided back pain. She was diagnosed with UTI, started on Keflex, discharged home.   She again presented to [facility] emergency department on 10/31 with similar complaints, including abdominal pain, back pain, and fatigue. Urinalysis again consistent with urinary tract infection. Hypotension again noted this time worse than her baseline with systolic blood pressures in the 70s. Hemoglobin found to be 4.9. She denies any obvious bleeding. She was administered 2 L of IV fluid and 2 units of PRBC. Systolic blood pressure improved to the low 90s. Lactic acid within normal limits. CT scan of her abdomen and pelvis demonstrated what is thought to be enlarged hydrosalpinges and a prominence of the left kidney with suspected mild left hydronephrosis. She was started on broad-spectrum antibiotics. Based on complicated medical picture as well as hypotension PCCM has been asked to admit.     CULTURES: 10/22 /17 - urine - klebsiella pna in ER .............................. Blood 10/31 > Urine 10/31 >  ANTIBIOTICS: Keflex 10/22 via ER >> 5 days (12/09/15 Zosyn 10/31 > Vancomycin 10/31 >  SIGNIFICANT EVENTS: CT abd pelvis 10/31 > Enlarged tubular cystic structures involving the bilateral adnexa as above, suspected to reflect enlarged hydrosalpinges. These structures demonstrate low to intermediate fluid  density, raising the possibility that some component of proteinaceous material (as may be expected with infection or possibly blood) may be present. Further evaluation with dedicated pelvic ultrasound may be helpful to further evaluate this finding. Mild prominence of the left kidney with suspected mild left hydronephrosis. Finding may be secondary to the enlarged hydrosalpinges. Superimposed infection could also be considered. Correlation with urinalysis recommended. Hepatosplenomegaly. Small volume free fluid within the abdomen and pelvis with mild diffuse anasarca, suspected to be related to underlying intrinsic liver disease. Enlarged periaortic adenopathy as above, not well delineated on this noncontrast examination. Decreased density within the cardiac blood pool, suggesting anemia.  9/30 - 10/3 admit to [facility] for uncomplicated delivery. Epidural used. Estimated blood loss 200cc . 10/22 - to ED, diagnosed with UTI and discharged on Keflex.   SUBJECTIVE:  11./1/17:  LDH normal. Hpatogloin in process. No schistocytes. D-dimer 7.2. Fibrinogn 491. DAT antibodies - negative. Normmal LFT. Bili elevated to 3 but mostly direct 2mg %. Retic count appears normal.  S/ 4 U prbc so far 4.9 -> 6.8  VITAL SIGNS: BP (!) 80/55   Pulse 90   Temp 99.3 F (37.4 C) (Oral)   Resp (!) 26   Ht 5\' 4"  (1.626 m)   Wt 70.5 kg (155 lb 6.8 oz)   SpO2 98%   BMI 26.68 kg/m   HEMODYNAMICS:    VENTILATOR SETTINGS:    INTAKE / OUTPUT: I/O last 3 completed shifts: In: 3590 [I.V.:200; Blood:1340; IV Piggyback:2050] Out: -   PHYSICAL EXAMINATION: General:  Young female of normal body habitus in no acute distress Neuro:  Alert, oriented, nonfocal. Somnolent but easily arouses. HEENT:  Normocephalic, atraumatic, PERRL, no JVD Cardiovascular:  RRR, no  MRG Lungs:  Clear, rate 29, no distress Abdomen:  Diffusely tender, round s/p delivery Musculoskeletal:  No acute deformity or ROM limiation Skin:   Grossly intact  LABS: PULMONARY  Recent Labs Lab 12/14/15 0348  PHART 7.372  PCO2ART 31.6*  PO2ART 85.0  HCO3 18.4*  TCO2 19  O2SAT 96.0    CBC  Recent Labs Lab 12/13/15 2211 12/14/15 0045 12/14/15 0711  HGB 4.9* 5.2* 6.8*  HCT 14.9* 15.7* 20.4*  WBC 14.5* 10.9* 12.2*  PLT 151 151 173  179    COAGULATION  Recent Labs Lab 12/13/15 1745 12/14/15 0711  INR 1.49 1.44    CARDIAC   Recent Labs Lab 12/14/15 0711  TROPONINI <0.03   No results for input(s): PROBNP in the last 168 hours.   CHEMISTRY  Recent Labs Lab 12/13/15 1653 12/13/15 1722 12/14/15 0045  NA 138 139 138  K 4.0 3.3* 4.0  CL 108 112* 111  CO2 20* 18* 17*  GLUCOSE 117* 104* 93  BUN 25* 22* 24*  CREATININE 2.49* 2.22* 2.41*  CALCIUM 8.0* 7.1* 7.4*   Estimated Creatinine Clearance: 33.2 mL/min (by C-G formula based on SCr of 2.41 mg/dL (H)).   LIVER  Recent Labs Lab 12/13/15 1722 12/13/15 1745 12/14/15 0711  AST 22  --   --   ALT 18  --   --   ALKPHOS 84  --   --   BILITOT 2.0*  --   --   PROT 5.6*  --   --   ALBUMIN 1.4*  --   --   INR  --  1.49 1.44     INFECTIOUS  Recent Labs Lab 12/13/15 1724 12/14/15 0222 12/14/15 0711  LATICACIDVEN 1.62 0.66 0.9     ENDOCRINE CBG (last 3)   Recent Labs  12/14/15 0512  GLUCAP 78         IMAGING x48h  - image(s) personally visualized  -   highlighted in bold Ct Abdomen Pelvis Wo Contrast  Result Date: 12/13/2015 CLINICAL DATA:  Initial evaluation for acute abdominal pain. Status post recent vaginal delivery. EXAM: CT ABDOMEN AND PELVIS WITHOUT CONTRAST TECHNIQUE: Multidetector CT imaging of the abdomen and pelvis was performed following the standard protocol without IV contrast. COMPARISON:  None available. FINDINGS: Lower chest: Mild scattered atelectatic changes present within the visualized lung bases. Visualized lung bases are otherwise clear. Diffusely decreased density within the cardiac blood pool  suggestive of anemia. Changes related to pregnancy noted within the bilateral breasts. Hepatobiliary: 16 mm hypodensity within the posterior right hepatic lobe present, most consistent with a cyst. Liver itself is somewhat enlarged. Limited noncontrast evaluation of the liver is otherwise unremarkable. Gallbladder within normal limits. No biliary dilatation. Pancreas: Noncontrast evaluation the pancreas is unremarkable. Spleen: Spleen is enlarged measuring 16.2 cm in craniocaudad dimension. Adrenals/Urinary Tract: Adrenal glands are grossly unremarkable. The left kidney is somewhat prominent as compared to the right with question of mild hydronephrosis. This may be related to the ongoing pelvic process. No nephrolithiasis bilaterally. Right kidney within normal limits. No definite hydroureter. Partially distended bladder unremarkable. Stomach/Bowel: Stomach within normal limits. No evidence for bowel obstruction. No acute inflammatory changes seen about the bowels. Vascular/Lymphatic: Limited evaluation of the vascular structures on this noncontrast examination. Question enlarged periaortic lymph nodes measuring up to 15 mm (series 2, image 34). No other definite adenopathy on this noncontrast examination. Reproductive: Possible small fibroid arising from the uterine fundus noted (series 2, image 58). There are bilateral somewhat tubular  cystic lesions within both adnexa, measuring 14.2 x 5.7 cm on the right and 12.6 x 6.7 cm on the left. These lesions measure low height and intermediate fluid density (approximately 20-25 Hounsfield units). Ovaries themselves not well delineated. Findings are suspected to reflect hydrosalpinges. Ovarian malignancy felt unlikely given the bilateral symmetric nature of this finding and patient age. Other: No free intraperitoneal air. Small volume free fluid within the abdomen and pelvis. Musculoskeletal: Mild diffuse anasarca noted. No acute osseous abnormality. No worrisome lytic or  blastic osseous lesions. IMPRESSION: 1. Enlarged tubular cystic structures involving the bilateral adnexa as above, suspected to reflect enlarged hydrosalpinges. These structures demonstrate low to intermediate fluid density, raising the possibility that some component of proteinaceous material (as may be expected with infection or possibly blood) may be present. Further evaluation with dedicated pelvic ultrasound may be helpful to further evaluate this finding. 2. Mild prominence of the left kidney with suspected mild left hydronephrosis. Finding may be secondary to the enlarged hydrosalpinges. Superimposed infection could also be considered. Correlation with urinalysis recommended. 3. Hepatosplenomegaly. 4. Small volume free fluid within the abdomen and pelvis with mild diffuse anasarca, suspected to be related to underlying intrinsic liver disease. 5. Enlarged periaortic adenopathy as above, not well delineated on this noncontrast examination. 6. Decreased density within the cardiac blood pool, suggesting anemia. Electronically Signed   By: Rise MuBenjamin  McClintock M.D.   On: 12/13/2015 20:45   Dg Chest Portable 1 View  Result Date: 12/13/2015 CLINICAL DATA:  Shortness of breath for 2 days with central back pain EXAM: PORTABLE CHEST 1 VIEW COMPARISON:  None. FINDINGS: Cardiac shadow is at the upper limits of normal in size. The lungs are well aerated bilaterally. Mild bibasilar atelectatic changes are seen. No focal confluent infiltrate is noted. No bony abnormality is seen. IMPRESSION: Mild bibasilar atelectatic changes. Electronically Signed   By: Alcide CleverMark  Lukens M.D.   On: 12/13/2015 18:08        LINES/TUBES:   DISCUSSION: 30 year old female one month out from a normal pregnancy and delivery. Now being admitted for septic shock. UTI on UA with pyelonephritis and hydronephrosis. Also anemia with hemoglobin 4.9. Will admit to ICU for blood products and broad-spectrum antibiotics with further diagnostics  pending.  ASSESSMENT / PLAN:  PULMONARY A: No distress  P:   Supplemental O2 as needed Monitor closely for intubation risk, think she will avoid this.  CARDIOVASCULAR A:  - Some degree chronic hypotension with discharge at time of DC 10/3 90s to 100s.   - Hypotension/Shock -  septic vs hypovolemic vs hemorrhagic. - at admit  - course 12/14/15 looking more like hypovolemic v relative adrenal insuff  P:  - start neo for map goal > 65 - prbc for hgb > 7gm% or actively bleeding  -await cortisol - check echo   RENAL A:   L sided hydronephrosis - mild ? UTI related v mass effect Acute kidney injury    - likely improving 12/14/15 rounds . No foley  P:   Keep I/O track without foeley Gentle IVF resuscitation at 100cc/h Place foley Strict I&O Hydro mild, but may want to consider IR drain if clinically not improving.   GASTROINTESTINAL A:   Abd pain > likely due to hydro and pyelo. Also new hepatosplenomegaly on CT  P:   NPO Pepcid daily for SUP in setting renal failure Needs transvaginal ultrasound - pending 12/14/15 Call GI consult  HEMATOLOGIC A:   Acute anemia. No obvious hemorrhagic source but could have  just been post partum hge  - No evidence of GI or hemolysis (though haptoglobin pending)  P:  Serial CBC Transfuse for Hgb < 7   INFECTIOUS A:   12/04/15 - klebsiella UTI  Admit 12/13/2015 - with shock ? cause  P:   Check PCT  Broad ABX as above with zosyn/vanc Follow cultures Further imaging as above  ENDOCRINE A:   No acute issues  P:   Follow glucose on BMP  NEUROLOGIC A:   No acute issues  P:   RASS goal: 0 Continue to monitor  GYN/OB A: post partum first child. Little boy Tory at home and being taken care of aunt. Reportedly doing well. No breast feedig P Dr Shawnie PonsPratt to do bedside consult 12/14/2015    FAMILY  - Updates: Patient and family updated bedside 12/14/15    The patient is critically ill with multiple organ systems  failure and requires high complexity decision making for assessment and support, frequent evaluation and titration of therapies, application of advanced monitoring technologies and extensive interpretation of multiple databases.   Critical Care Time devoted to patient care services described in this note is  30  Minutes. This time reflects time of care of this signee Dr Kalman ShanMurali Saniyya Gau. This critical care time does not reflect procedure time, or teaching time or supervisory time of PA/NP/Med student/Med Resident etc but could involve care discussion time    Dr. Kalman ShanMurali Bernadean Saling, M.D., [facility] HospitalF.C.C.P Pulmonary and Critical Care Medicine Staff Physician [facility] Pager: [phone], If no answer or between  15:00h - 7:00h: call [phone]  12/14/2015 9:24 AM

## 2015-12-14 NOTE — Consult Note (Signed)
[facility] CONSULT Reason for consult: abnormal liver on CT scan Referring Physician: Critical Care Medicine. PCP: OB service  Carolyn Lynn is an 30 y.o. female.  HPI: she is G1 P1 and had an complicated and uneventful pregnancy with delivery exactly one month ago. She notes that she had some abdominal pain and a bloated abdomen after delivery with vaginal bleeding that lasted approximately 2 to 3 weeks. She came to the emergency room 10/22 was evaluated at that time. She was found to have a UTI with cultures growing Klebsiella. The patient been sent home on cefazolin and apparently the Klebsiella was sensitive to that. She states that she really didn't feel any better after starting antibiotics and presented to the emergency room with abdominal pain back pain and profound fatigue with urinalysis again showing extensive polyuria. She was hypotensive with systolic blood pressure in the 70s hemoglobin 4.9. CT scan of the abdomen was performed and revealed a prominent left kidney with suspected hydronephrosis and she has been started on broad-spectrum antibiotics. See CT also showed and slightly enlarged spleen and possibly enlarged liver with no clear dilated ducts. The gallbladder appeared to be normal. There was abnormality of rednecks here with a question of infection. 2 to the bleeding that she had all these other issues and abdominal ultrasound to look for signs of portal vein thrombosis in a pelvic ultrasound have been performed but the results are still pending at this time. The patient's liver test revealed normal transaminases and alkaline phosphatase but extremely low albumin of 1.4 total bilirubin 2.0. That had risen to 3.2 today most of this direct. Other pertinent findings were a low potassium bicarbonate and creatinine elevated 2.2. The patient is being treated with IV fluids and IV antibiotics. Hepatitis markers are pending. Long discussion with the patient and her mother. She was  completely healthy prior to the pregnancy and is a nondrinker. She had really very few problems during pregnancy according to the mother nothing that would be considered out of the ordinary and had a fairly uneventful delivery. She has no chronic medical problems. Her repeat urinalysis indicates continued significant urinary tract infection.  Past Medical History:  Diagnosis Date  . Medical history non-contributory     Past Surgical History:  Procedure Laterality Date  . NO PAST SURGERIES      History reviewed. No pertinent family history.  Social History:  reports that she has never smoked. She has never used smokeless tobacco. She reports that she does not drink alcohol or use drugs.  Allergies: No Known Allergies  Medications; Prior to Admission medications   Medication Sig Start Date End Date Taking? Authorizing Provider  docusate sodium (COLACE) 100 MG capsule Take 1 capsule (100 mg total) by mouth every 12 (twelve) hours. Patient taking differently: Take 100 mg by mouth 2 (two) times daily as needed for mild constipation.  12/04/15  Yes Shawn C Joy, PA-C  ferrous sulfate 325 (65 FE) MG tablet Take 325 mg by mouth 3 (three) times daily. 11/21/15  Yes Historical Provider, MD  ibuprofen (ADVIL,MOTRIN) 200 MG tablet Take 400-600 mg by mouth every 6 (six) hours as needed (for pain).    Yes Historical Provider, MD  Prenatal Vit-Fe Fumarate-FA (PRENATAL MULTIVITAMIN) TABS tablet Take 1 tablet by mouth daily.    Yes Historical Provider, MD   . famotidine (PEPCID) IV  20 mg Intravenous Q24H  . piperacillin-tazobactam (ZOSYN)  IV  3.375 g Intravenous Q8H  . [START ON 12/15/2015] vancomycin  750  mg Intravenous Q24H   PRN Meds fentaNYL (SUBLIMAZE) injection Results for orders placed or performed during the hospital encounter of 12/13/15 (from the past 48 hour(s))  Basic metabolic panel     Status: Abnormal   Collection Time: 12/13/15  4:53 PM  Result Value Ref Range   Sodium 138 135 -  145 mmol/L   Potassium 4.0 3.5 - 5.1 mmol/L   Chloride 108 101 - 111 mmol/L   CO2 20 (L) 22 - 32 mmol/L   Glucose, Bld 117 (H) 65 - 99 mg/dL   BUN 25 (H) 6 - 20 mg/dL   Creatinine, Ser 2.49 (H) 0.44 - 1.00 mg/dL   Calcium 8.0 (L) 8.9 - 10.3 mg/dL   GFR calc non Af Amer 25 (L) >60 mL/min   GFR calc Af Amer 29 (L) >60 mL/min    Comment: (NOTE) The eGFR has been calculated using the CKD EPI equation. This calculation has not been validated in all clinical situations. eGFR's persistently <60 mL/min signify possible Chronic Kidney Disease.    Anion gap 10 5 - 15  CBC     Status: Abnormal   Collection Time: 12/13/15  4:53 PM  Result Value Ref Range   WBC 15.8 (H) 4.0 - 10.5 K/uL   RBC 2.01 (L) 3.87 - 5.11 MIL/uL   Hemoglobin 4.9 (LL) 12.0 - 15.0 g/dL    Comment: REPEATED TO VERIFY SPECIMEN CHECKED FOR CLOTS CRITICAL RESULT CALLED TO, READ BACK BY AND VERIFIED WITH: C HAMANN,RN [id number] 10?31/2017 [facility] [place] PER BROOK BELTRER,RN [id number] 12/13/2015 [facility]    HCT 14.9 (L) 36.0 - 46.0 %   MCV 74.1 (L) 78.0 - 100.0 fL   MCH 24.4 (L) 26.0 - 34.0 pg   MCHC 32.9 30.0 - 36.0 g/dL   RDW 18.5 (H) 11.5 - 15.5 %   Platelets 171 150 - 400 K/uL  I-Stat Troponin, ED (not at [facility])     Status: None   Collection Time: 12/13/15  5:19 PM  Result Value Ref Range   Troponin i, poc 0.03 0.00 - 0.08 ng/mL   Comment 3            Comment: Due to the release kinetics of cTnI, a negative result within the first hours of the onset of symptoms does not rule out myocardial infarction with certainty. If myocardial infarction is still suspected, repeat the test at appropriate intervals.   Comprehensive metabolic panel     Status: Abnormal   Collection Time: 12/13/15  5:22 PM  Result Value Ref Range   Sodium 139 135 - 145 mmol/L   Potassium 3.3 (L) 3.5 - 5.1 mmol/L    Comment: DELTA CHECK NOTED   Chloride 112 (H) 101 - 111 mmol/L   CO2 18 (L) 22 - 32 mmol/L   Glucose, Bld 104 (H) 65 - 99 mg/dL   BUN 22 (H) 6 -  20 mg/dL   Creatinine, Ser 2.22 (H) 0.44 - 1.00 mg/dL   Calcium 7.1 (L) 8.9 - 10.3 mg/dL   Total Protein 5.6 (L) 6.5 - 8.1 g/dL   Albumin 1.4 (L) 3.5 - 5.0 g/dL   AST 22 15 - 41 U/L   ALT 18 14 - 54 U/L   Alkaline Phosphatase 84 38 - 126 U/L   Total Bilirubin 2.0 (H) 0.3 - 1.2 mg/dL   GFR calc non Af Amer 29 (L) >60 mL/min   GFR calc Af Amer 33 (L) >60 mL/min    Comment: (NOTE) The eGFR has been  calculated using the CKD EPI equation. This calculation has not been validated in all clinical situations. eGFR's persistently <60 mL/min signify possible Chronic Kidney Disease.    Anion gap 9 5 - 15  Lipase, blood     Status: None   Collection Time: 12/13/15  5:22 PM  Result Value Ref Range   Lipase 13 11 - 51 U/L  Brain natriuretic peptide     Status: Abnormal   Collection Time: 12/13/15  5:23 PM  Result Value Ref Range   B Natriuretic Peptide 350.5 (H) 0.0 - 100.0 pg/mL  I-Stat CG4 Lactic Acid, ED     Status: None   Collection Time: 12/13/15  5:24 PM  Result Value Ref Range   Lactic Acid, Venous 1.62 0.5 - 1.9 mmol/L  Type and screen [place]     Status: None (Preliminary result)   Collection Time: 12/13/15  5:45 PM  Result Value Ref Range   ABO/RH(D) B POS    Antibody Screen NEG    Sample Expiration 12/16/2015    Unit Number [id number]    Blood Component Type RED CELLS,LR    Unit division 00    Status of Unit ISSUED,FINAL    Transfusion Status OK TO TRANSFUSE    Crossmatch Result Compatible    Unit Number [id number]    Blood Component Type RED CELLS,LR    Unit division 00    Status of Unit ISSUED,FINAL    Transfusion Status OK TO TRANSFUSE    Crossmatch Result Compatible    Unit Number [id number]    Blood Component Type RED CELLS,LR    Unit division 00    Status of Unit ISSUED    Transfusion Status OK TO TRANSFUSE    Crossmatch Result Compatible    Unit Number [id number]    Blood Component Type RED CELLS,LR    Unit division 00     Status of Unit ALLOCATED    Transfusion Status OK TO TRANSFUSE    Crossmatch Result Compatible    Unit Number [id number]    Blood Component Type RED CELLS,LR    Unit division 00    Status of Unit ISSUED    Transfusion Status OK TO TRANSFUSE    Crossmatch Result Compatible   Protime-INR     Status: Abnormal   Collection Time: 12/13/15  5:45 PM  Result Value Ref Range   Prothrombin Time 18.2 (H) 11.4 - 15.2 seconds   INR 1.49   APTT     Status: Abnormal   Collection Time: 12/13/15  5:45 PM  Result Value Ref Range   aPTT 51 (H) 24 - 36 seconds    Comment:        IF BASELINE aPTT IS ELEVATED, SUGGEST PATIENT RISK ASSESSMENT BE USED TO DETERMINE APPROPRIATE ANTICOAGULANT THERAPY.   Iron and TIBC     Status: Abnormal   Collection Time: 12/13/15  5:45 PM  Result Value Ref Range   Iron 6 (L) 28 - 170 ug/dL   TIBC 139 (L) 250 - 450 ug/dL   Saturation Ratios 4 (L) 10.4 - 31.8 %   UIBC 133 ug/dL  ABO/Rh     Status: None   Collection Time: 12/13/15  5:45 PM  Result Value Ref Range   ABO/RH(D) B POS   Prepare RBC     Status: None   Collection Time: 12/13/15  6:00 PM  Result Value Ref Range   Order Confirmation ORDER PROCESSED BY BLOOD BANK   Urinalysis, Routine w reflex  microscopic (not at [facility])     Status: Abnormal   Collection Time: 12/13/15  8:31 PM  Result Value Ref Range   Color, Urine AMBER (A) YELLOW    Comment: BIOCHEMICALS MAY BE AFFECTED BY COLOR   APPearance TURBID (A) CLEAR   Specific Gravity, Urine 1.019 1.005 - 1.030   pH 6.0 5.0 - 8.0   Glucose, UA NEGATIVE NEGATIVE mg/dL   Hgb urine dipstick LARGE (A) NEGATIVE   Bilirubin Urine SMALL (A) NEGATIVE   Ketones, ur NEGATIVE NEGATIVE mg/dL   Protein, ur 100 (A) NEGATIVE mg/dL   Nitrite POSITIVE (A) NEGATIVE   Leukocytes, UA LARGE (A) NEGATIVE  Urine microscopic-add on     Status: Abnormal   Collection Time: 12/13/15  8:31 PM  Result Value Ref Range   Squamous Epithelial / LPF 0-5 (A) NONE SEEN   WBC, UA TOO  NUMEROUS TO COUNT 0 - 5 WBC/hpf   RBC / HPF 6-30 0 - 5 RBC/hpf   Bacteria, UA MANY (A) NONE SEEN  Urine rapid drug screen (hosp performed)     Status: None   Collection Time: 12/13/15  8:31 PM  Result Value Ref Range   Opiates NONE DETECTED NONE DETECTED   Cocaine NONE DETECTED NONE DETECTED   Benzodiazepines NONE DETECTED NONE DETECTED   Amphetamines NONE DETECTED NONE DETECTED   Tetrahydrocannabinol NONE DETECTED NONE DETECTED   Barbiturates NONE DETECTED NONE DETECTED    Comment:        DRUG SCREEN FOR MEDICAL PURPOSES ONLY.  IF CONFIRMATION IS NEEDED FOR ANY PURPOSE, NOTIFY LAB WITHIN 5 DAYS.        LOWEST DETECTABLE LIMITS FOR URINE DRUG SCREEN Drug Class       Cutoff (ng/mL) Amphetamine      1000 Barbiturate      200 Benzodiazepine   867 Tricyclics       619 Opiates          300 Cocaine          300 THC              50   POC urine preg, ED (not at [facility])     Status: None   Collection Time: 12/13/15  8:37 PM  Result Value Ref Range   Preg Test, Ur NEGATIVE NEGATIVE    Comment:        THE SENSITIVITY OF THIS METHODOLOGY IS >24 mIU/mL   CBC     Status: Abnormal   Collection Time: 12/13/15 10:11 PM  Result Value Ref Range   WBC 14.5 (H) 4.0 - 10.5 K/uL   RBC 1.99 (L) 3.87 - 5.11 MIL/uL   Hemoglobin 4.9 (LL) 12.0 - 15.0 g/dL    Comment: REPEATED TO VERIFY CRITICAL VALUE NOTED.  VALUE IS CONSISTENT WITH PREVIOUSLY REPORTED AND CALLED VALUE.    HCT 14.9 (L) 36.0 - 46.0 %   MCV 74.9 (L) 78.0 - 100.0 fL   MCH 24.6 (L) 26.0 - 34.0 pg   MCHC 32.9 30.0 - 36.0 g/dL   RDW 18.9 (H) 11.5 - 15.5 %   Platelets 151 150 - 400 K/uL  CBC     Status: Abnormal   Collection Time: 12/14/15 12:45 AM  Result Value Ref Range   WBC 10.9 (H) 4.0 - 10.5 K/uL   RBC 2.08 (L) 3.87 - 5.11 MIL/uL   Hemoglobin 5.2 (LL) 12.0 - 15.0 g/dL    Comment: REPEATED TO VERIFY CRITICAL VALUE NOTED.  VALUE IS CONSISTENT WITH PREVIOUSLY REPORTED  AND CALLED VALUE.    HCT 15.7 (L) 36.0 - 46.0 %   MCV  75.5 (L) 78.0 - 100.0 fL   MCH 25.0 (L) 26.0 - 34.0 pg   MCHC 33.1 30.0 - 36.0 g/dL   RDW 17.8 (H) 11.5 - 15.5 %   Platelets 151 150 - 400 K/uL  Basic metabolic panel     Status: Abnormal   Collection Time: 12/14/15 12:45 AM  Result Value Ref Range   Sodium 138 135 - 145 mmol/L   Potassium 4.0 3.5 - 5.1 mmol/L    Comment: DELTA CHECK NOTED   Chloride 111 101 - 111 mmol/L   CO2 17 (L) 22 - 32 mmol/L   Glucose, Bld 93 65 - 99 mg/dL   BUN 24 (H) 6 - 20 mg/dL   Creatinine, Ser 2.41 (H) 0.44 - 1.00 mg/dL   Calcium 7.4 (L) 8.9 - 10.3 mg/dL   GFR calc non Af Amer 26 (L) >60 mL/min   GFR calc Af Amer 30 (L) >60 mL/min    Comment: (NOTE) The eGFR has been calculated using the CKD EPI equation. This calculation has not been validated in all clinical situations. eGFR's persistently <60 mL/min signify possible Chronic Kidney Disease.    Anion gap 10 5 - 15  I-Stat CG4 Lactic Acid, ED     Status: None   Collection Time: 12/14/15  2:22 AM  Result Value Ref Range   Lactic Acid, Venous 0.66 0.5 - 1.9 mmol/L  Prepare RBC     Status: None   Collection Time: 12/14/15  3:42 AM  Result Value Ref Range   Order Confirmation ORDER PROCESSED BY BLOOD BANK   I-Stat arterial blood gas, ED     Status: Abnormal   Collection Time: 12/14/15  3:48 AM  Result Value Ref Range   pH, Arterial 7.372 7.350 - 7.450   pCO2 arterial 31.6 (L) 32.0 - 48.0 mmHg   pO2, Arterial 85.0 83.0 - 108.0 mmHg   Bicarbonate 18.4 (L) 20.0 - 28.0 mmol/L   TCO2 19 0 - 100 mmol/L   O2 Saturation 96.0 %   Acid-base deficit 6.0 (H) 0.0 - 2.0 mmol/L   Patient temperature 98.1 F    Sample type ARTERIAL   MRSA PCR Screening     Status: None   Collection Time: 12/14/15  5:11 AM  Result Value Ref Range   MRSA by PCR NEGATIVE NEGATIVE    Comment:        The GeneXpert MRSA Assay (FDA approved for NASAL specimens only), is one component of a comprehensive MRSA colonization surveillance program. It is not intended to diagnose  MRSA infection nor to guide or monitor treatment for MRSA infections.   Glucose, capillary     Status: None   Collection Time: 12/14/15  5:12 AM  Result Value Ref Range   Glucose-Capillary 78 65 - 99 mg/dL   Comment 1 Notify RN   Basic metabolic panel     Status: Abnormal   Collection Time: 12/14/15  7:11 AM  Result Value Ref Range   Sodium 139 135 - 145 mmol/L   Potassium 3.8 3.5 - 5.1 mmol/L   Chloride 110 101 - 111 mmol/L   CO2 19 (L) 22 - 32 mmol/L   Glucose, Bld 83 65 - 99 mg/dL   BUN 24 (H) 6 - 20 mg/dL   Creatinine, Ser 2.39 (H) 0.44 - 1.00 mg/dL   Calcium 7.8 (L) 8.9 - 10.3 mg/dL   GFR calc non  Af Amer 26 (L) >60 mL/min   GFR calc Af Amer 30 (L) >60 mL/min    Comment: (NOTE) The eGFR has been calculated using the CKD EPI equation. This calculation has not been validated in all clinical situations. eGFR's persistently <60 mL/min signify possible Chronic Kidney Disease.    Anion gap 10 5 - 15  DIC (disseminated intravasc coag) panel     Status: Abnormal   Collection Time: 12/14/15  7:11 AM  Result Value Ref Range   Prothrombin Time 17.7 (H) 11.4 - 15.2 seconds   INR 1.44    aPTT 47 (H) 24 - 36 seconds    Comment:        IF BASELINE aPTT IS ELEVATED, SUGGEST PATIENT RISK ASSESSMENT BE USED TO DETERMINE APPROPRIATE ANTICOAGULANT THERAPY.    Fibrinogen 491 (H) 210 - 475 mg/dL   D-Dimer, Quant 7.21 (H) 0.00 - 0.50 ug/mL-FEU    Comment: (NOTE) At the manufacturer cut-off of 0.50 ug/mL FEU, this assay has been documented to exclude PE with a sensitivity and negative predictive value of 97 to 99%.  At this time, this assay has not been approved by the FDA to exclude DVT/VTE. Results should be correlated with clinical presentation.    Platelets 173 150 - 400 K/uL   Smear Review NO SCHISTOCYTES SEEN   Ethanol     Status: None   Collection Time: 12/14/15  7:11 AM  Result Value Ref Range   Alcohol, Ethyl (B) <5 <5 mg/dL    Comment:        LOWEST DETECTABLE LIMIT  FOR SERUM ALCOHOL IS 5 mg/dL FOR MEDICAL PURPOSES ONLY   Reticulocytes     Status: Abnormal   Collection Time: 12/14/15  7:11 AM  Result Value Ref Range   Retic Ct Pct 2.7 0.4 - 3.1 %   RBC. 2.63 (L) 3.87 - 5.11 MIL/uL   Retic Count, Manual 71.0 19.0 - 186.0 K/uL  Troponin I     Status: Abnormal   Collection Time: 12/14/15  7:11 AM  Result Value Ref Range   Troponin I 0.03 (HH) <0.03 ng/mL    Comment: CRITICAL RESULT CALLED TO, READ BACK BY AND VERIFIED WITH: L.ROBBINS,RN 0824 12/14/15 CLARK,S   Troponin I     Status: None   Collection Time: 12/14/15  7:11 AM  Result Value Ref Range   Troponin I <0.03 <0.03 ng/mL  Lactic acid, plasma     Status: None   Collection Time: 12/14/15  7:11 AM  Result Value Ref Range   Lactic Acid, Venous 0.9 0.5 - 1.9 mmol/L  CBC     Status: Abnormal   Collection Time: 12/14/15  7:11 AM  Result Value Ref Range   WBC 12.2 (H) 4.0 - 10.5 K/uL    Comment: REPEATED TO VERIFY   RBC 2.68 (L) 3.87 - 5.11 MIL/uL   Hemoglobin 6.8 (LL) 12.0 - 15.0 g/dL    Comment: REPEATED TO VERIFY POST TRANSFUSION SPECIMEN CRITICAL VALUE NOTED.  VALUE IS CONSISTENT WITH PREVIOUSLY REPORTED AND CALLED VALUE.    HCT 20.4 (L) 36.0 - 46.0 %   MCV 76.1 (L) 78.0 - 100.0 fL   MCH 25.4 (L) 26.0 - 34.0 pg   MCHC 33.3 30.0 - 36.0 g/dL   RDW 17.3 (H) 11.5 - 15.5 %   Platelets 179 150 - 400 K/uL  Procalcitonin     Status: None   Collection Time: 12/14/15  7:11 AM  Result Value Ref Range   Procalcitonin 5.83 ng/mL  Comment:        Interpretation: PCT > 2 ng/mL: Systemic infection (sepsis) is likely, unless other causes are known. (NOTE)         ICU PCT Algorithm               Non ICU PCT Algorithm    ----------------------------     ------------------------------         PCT < 0.25 ng/mL                 PCT < 0.1 ng/mL     Stopping of antibiotics            Stopping of antibiotics       strongly encouraged.               strongly encouraged.     ----------------------------     ------------------------------       PCT level decrease by               PCT < 0.25 ng/mL       >= 80% from peak PCT       OR PCT 0.25 - 0.5 ng/mL          Stopping of antibiotics                                             encouraged.     Stopping of antibiotics           encouraged.    ----------------------------     ------------------------------       PCT level decrease by              PCT >= 0.25 ng/mL       < 80% from peak PCT        AND PCT >= 0.5 ng/mL            Continuing antibiotics                                               encouraged.       Continuing antibiotics            encouraged.    ----------------------------     ------------------------------     PCT level increase compared          PCT > 0.5 ng/mL         with peak PCT AND          PCT >= 0.5 ng/mL             Escalation of antibiotics                                          strongly encouraged.      Escalation of antibiotics        strongly encouraged.   Cortisol     Status: None   Collection Time: 12/14/15  7:11 AM  Result Value Ref Range   Cortisol, Plasma 19.8 ug/dL    Comment: (NOTE) AM    6.7 - 22.6 ug/dL PM   <10.0       ug/dL   Magnesium  Status: None   Collection Time: 12/14/15  7:11 AM  Result Value Ref Range   Magnesium 1.7 1.7 - 2.4 mg/dL  Phosphorus     Status: Abnormal   Collection Time: 12/14/15  7:11 AM  Result Value Ref Range   Phosphorus 6.0 (H) 2.5 - 4.6 mg/dL  Bilirubin, fractionated(tot/dir/indir)     Status: Abnormal   Collection Time: 12/14/15  7:11 AM  Result Value Ref Range   Total Bilirubin 3.2 (H) 0.3 - 1.2 mg/dL   Bilirubin, Direct 2.1 (H) 0.1 - 0.5 mg/dL   Indirect Bilirubin 1.1 (H) 0.3 - 0.9 mg/dL  Direct antiglobulin test (not at [facility])     Status: None   Collection Time: 12/14/15  7:11 AM  Result Value Ref Range   DAT, complement NEG    DAT, IgG NEG   Lactate dehydrogenase     Status: None   Collection Time: 12/14/15  7:11  AM  Result Value Ref Range   LDH 184 98 - 192 U/L  Antithrombin III     Status: Abnormal   Collection Time: 12/14/15  7:11 AM  Result Value Ref Range   AntiThromb III Func 54 (L) 75 - 120 %  Procalcitonin - Baseline     Status: None   Collection Time: 12/14/15  7:11 AM  Result Value Ref Range   Procalcitonin 5.82 ng/mL    Comment:        Interpretation: PCT > 2 ng/mL: Systemic infection (sepsis) is likely, unless other causes are known. (NOTE)         ICU PCT Algorithm               Non ICU PCT Algorithm    ----------------------------     ------------------------------         PCT < 0.25 ng/mL                 PCT < 0.1 ng/mL     Stopping of antibiotics            Stopping of antibiotics       strongly encouraged.               strongly encouraged.    ----------------------------     ------------------------------       PCT level decrease by               PCT < 0.25 ng/mL       >= 80% from peak PCT       OR PCT 0.25 - 0.5 ng/mL          Stopping of antibiotics                                             encouraged.     Stopping of antibiotics           encouraged.    ----------------------------     ------------------------------       PCT level decrease by              PCT >= 0.25 ng/mL       < 80% from peak PCT        AND PCT >= 0.5 ng/mL            Continuing antibiotics  encouraged.       Continuing antibiotics            encouraged.    ----------------------------     ------------------------------     PCT level increase compared          PCT > 0.5 ng/mL         with peak PCT AND          PCT >= 0.5 ng/mL             Escalation of antibiotics                                          strongly encouraged.      Escalation of antibiotics        strongly encouraged.     Ct Abdomen Pelvis Wo Contrast  Result Date: 12/13/2015 CLINICAL DATA:  Initial evaluation for acute abdominal pain. Status post recent vaginal delivery.  EXAM: CT ABDOMEN AND PELVIS WITHOUT CONTRAST TECHNIQUE: Multidetector CT imaging of the abdomen and pelvis was performed following the standard protocol without IV contrast. COMPARISON:  None available. FINDINGS: Lower chest: Mild scattered atelectatic changes present within the visualized lung bases. Visualized lung bases are otherwise clear. Diffusely decreased density within the cardiac blood pool suggestive of anemia. Changes related to pregnancy noted within the bilateral breasts. Hepatobiliary: 16 mm hypodensity within the posterior right hepatic lobe present, most consistent with a cyst. Liver itself is somewhat enlarged. Limited noncontrast evaluation of the liver is otherwise unremarkable. Gallbladder within normal limits. No biliary dilatation. Pancreas: Noncontrast evaluation the pancreas is unremarkable. Spleen: Spleen is enlarged measuring 16.2 cm in craniocaudad dimension. Adrenals/Urinary Tract: Adrenal glands are grossly unremarkable. The left kidney is somewhat prominent as compared to the right with question of mild hydronephrosis. This may be related to the ongoing pelvic process. No nephrolithiasis bilaterally. Right kidney within normal limits. No definite hydroureter. Partially distended bladder unremarkable. Stomach/Bowel: Stomach within normal limits. No evidence for bowel obstruction. No acute inflammatory changes seen about the bowels. Vascular/Lymphatic: Limited evaluation of the vascular structures on this noncontrast examination. Question enlarged periaortic lymph nodes measuring up to 15 mm (series 2, image 34). No other definite adenopathy on this noncontrast examination. Reproductive: Possible small fibroid arising from the uterine fundus noted (series 2, image 58). There are bilateral somewhat tubular cystic lesions within both adnexa, measuring 14.2 x 5.7 cm on the right and 12.6 x 6.7 cm on the left. These lesions measure low height and intermediate fluid density (approximately  20-25 Hounsfield units). Ovaries themselves not well delineated. Findings are suspected to reflect hydrosalpinges. Ovarian malignancy felt unlikely given the bilateral symmetric nature of this finding and patient age. Other: No free intraperitoneal air. Small volume free fluid within the abdomen and pelvis. Musculoskeletal: Mild diffuse anasarca noted. No acute osseous abnormality. No worrisome lytic or blastic osseous lesions. IMPRESSION: 1. Enlarged tubular cystic structures involving the bilateral adnexa as above, suspected to reflect enlarged hydrosalpinges. These structures demonstrate low to intermediate fluid density, raising the possibility that some component of proteinaceous material (as may be expected with infection or possibly blood) may be present. Further evaluation with dedicated pelvic ultrasound may be helpful to further evaluate this finding. 2. Mild prominence of the left kidney with suspected mild left hydronephrosis. Finding may be secondary to the enlarged hydrosalpinges. Superimposed infection could also be considered. Correlation with urinalysis recommended. 3.  Hepatosplenomegaly. 4. Small volume free fluid within the abdomen and pelvis with mild diffuse anasarca, suspected to be related to underlying intrinsic liver disease. 5. Enlarged periaortic adenopathy as above, not well delineated on this noncontrast examination. 6. Decreased density within the cardiac blood pool, suggesting anemia. Electronically Signed   By: Jeannine Boga M.D.   On: 12/13/2015 20:45   Dg Chest Portable 1 View  Result Date: 12/13/2015 CLINICAL DATA:  Shortness of breath for 2 days with central back pain EXAM: PORTABLE CHEST 1 VIEW COMPARISON:  None. FINDINGS: Cardiac shadow is at the upper limits of normal in size. The lungs are well aerated bilaterally. Mild bibasilar atelectatic changes are seen. No focal confluent infiltrate is noted. No bony abnormality is seen. IMPRESSION: Mild bibasilar atelectatic  changes. Electronically Signed   By: Inez Catalina M.D.   On: 12/13/2015 18:08   ROS: as above and HPI           Blood pressure (!) 88/59, pulse 85, temperature 98.5 F (36.9 C), temperature source Oral, resp. rate (!) 26, height 5' 4"  (1.626 m), weight 70.5 kg (155 lb 6.8 oz), SpO2 99 %, unknown if currently breastfeeding.  Physical exam:   General-- alert African-American female no distress ENT-- nonicteric Neck-- supplemental lymphadenopathy  Heart-- regular rate and rhythm with no murmurs Lungs-- clear somewhat Abdomen-- good bowel sounds abdomen slightly distended but no localized tenderness. Psych-- alert and oriented.   Assessment: 1. Abnormally imaging of the liver. It's difficult to know what the cause of this is but my suspicion that it due to infection. She has a very low albumin but minimal ascites and her pro time is relatively normal. Her transaminases are normal as well. Hopefully her liver abnormalities will prove successful treatment of her renal infection. Her Doppler ultrasound of the liver is pending at this time. 2. UTI. This is a bit confusing because the patient did have Klebsiella in her urine and this was adequately treated and appeared to be sensitive to the antibiotic she was given. There is a question of abnormality in the kidney on CT 3. Abnormality of the adnexa area. This could be a source of infection as well further testing is pending 4. Acute renal failure. Hopefully this will improve it IV hydration 5. Profound anemia. She does not seem to be G.I. bleeding and I suspect this is due to the postpartum state as well as profound iron deficiency and underlying sepsis  Plan: we will follow with you. I agree with going ahead and obtaining hepatitis markers and following her liver test.   Mecca Barga JR,Judine Arciniega L 12/14/2015, 11:03 AM   This note was created using voice recognition software and minor errors may Have occurred unintentionally. Pager:  [phone] If no answer or after hours call [phone]

## 2015-12-14 NOTE — ED Notes (Signed)
abg collected  

## 2015-12-14 NOTE — ED Notes (Signed)
Per MD Christean LeafSchubert, hold 3rd unit of blood, redraw labs, and hang fluid bolus.

## 2015-12-14 NOTE — Progress Notes (Signed)
eLink Physician-Brief Progress Note Patient Name: Carolyn Lynn DOB: 08/12/1985 MRN: [id number]   Date of Service  12/14/2015  HPI/Events of Note  Patient complaining of flank pain. Currently nothing by mouth. Blood pressure still soft.   eICU Interventions  Will try 0.5 mg Dilaudid IV 1.  Awaiting further test results.      Intervention Category Intermediate Interventions: Other:  Louann SjogrenJose Angelo A De Dios 12/14/2015, 6:49 AM

## 2015-12-14 NOTE — H&P (Signed)
PULMONARY / CRITICAL CARE MEDICINE   Name: Carolyn Lynn MRN: [id number] DOB: 12/29/1985    ADMISSION DATE:  12/13/2015 CONSULTATION DATE:  12/14/2015  REFERRING MD:  Dr. Anitra LauthPlunkett EDP  CHIEF COMPLAINT:  Abd pain  HISTORY OF PRESENT ILLNESS:  30 year old female G1 P1 who is one month postpartum from an uncomplicated pregnancy, uncomplicated delivery, and uncomplicated postpartum course per her discharge summary [facility]. Of note, at time of discharge systolic blood pressure ranged from 90-103.She presented to the emergency department on 10/22 with complaints of fatigue, abdominal pain, and left-sided back pain. She was diagnosed with UTI, started on Keflex, discharged home.   She again presented [facility] department on 10/31 with similar complaints, including abdominal pain, back pain, and fatigue. Urinalysis again consistent with urinary tract infection. Hypotension again noted this time worse than her baseline with systolic blood pressures in the 70s. Hemoglobin found to be 4.9. She denies any obvious bleeding. She was administered 2 L of IV fluid and 2 units of PRBC. Systolic blood pressure improved to the low 90s. Lactic acid within normal limits. CT scan of her abdomen and pelvis demonstrated what is thought to be enlarged hydrosalpinges and a prominence of the left kidney with suspected mild left hydronephrosis. She was started on broad-spectrum antibiotics. Based on complicated medical picture as well as hypotension PCCM has been asked to admit.   PAST MEDICAL HISTORY :  She  has a past medical history of Medical history non-contributory.  PAST SURGICAL HISTORY: She  has a past surgical history that includes No past surgeries.  No Known Allergies  No current facility-administered medications on file prior to encounter.    Current Outpatient Prescriptions on File Prior to Encounter  Medication Sig  . docusate sodium (COLACE) 100 MG capsule Take 1  capsule (100 mg total) by mouth every 12 (twelve) hours. (Patient taking differently: Take 100 mg by mouth 2 (two) times daily as needed for mild constipation. )  . ferrous sulfate 325 (65 FE) MG tablet Take 325 mg by mouth 3 (three) times daily.  Marland Kitchen. ibuprofen (ADVIL,MOTRIN) 200 MG tablet Take 400-600 mg by mouth every 6 (six) hours as needed (for pain).   . Prenatal Vit-Fe Fumarate-FA (PRENATAL MULTIVITAMIN) TABS tablet Take 1 tablet by mouth daily.     FAMILY HISTORY:  Her has no family status information on file.    SOCIAL HISTORY: She  reports that she has never smoked. She has never used smokeless tobacco. She reports that she does not drink alcohol or use drugs.  REVIEW OF SYSTEMS:  Limited due to somnolence Bolds are positive  Constitutional: weight loss, gain, night sweats, Fevers, chills, fatigue .  HEENT: headaches, Sore throat, sneezing, nasal congestion, post nasal drip, Difficulty swallowing, Tooth/dental problems, visual complaints visual changes, ear ache CV:  chest pain, radiates: ,Orthopnea, PND, swelling in lower extremities, dizziness, palpitations, syncope.  GI  heartburn, indigestion, abdominal pain, nausea, vomiting, diarrhea, change in bowel habits, loss of appetite, bloody stools.  Resp: cough, productive: , hemoptysis, dyspnea, chest pain, pleuritic.  Skin: rash or itching or icterus GU: dysuria, change in color of urine, urgency or frequency. flank pain, hematuria  MS: joint pain or swelling. decreased range of motion  Psych: change in mood or affect. depression or anxiety.  Neuro: difficulty with speech, weakness, numbness, ataxia    SUBJECTIVE:    VITAL SIGNS: BP 97/59   Pulse 100   Temp 97.8 F (36.6 C) (Oral)  Resp 18   Ht 5\' 4"  (1.626 m)   Wt 66.2 kg (146 lb)   SpO2 96%   BMI 25.06 kg/m   HEMODYNAMICS:    VENTILATOR SETTINGS:    INTAKE / OUTPUT: I/O last 3 completed shifts: In: 1000 [IV Piggyback:1000] Out: -   PHYSICAL  EXAMINATION: General:  Young female of normal body habitus in no acute distress Neuro:  Alert, oriented, nonfocal. Somnolent but easily arouses. HEENT:  Normocephalic, atraumatic, PERRL, no JVD Cardiovascular:  RRR, no MRG Lungs:  Clear, rate 29, no distress Abdomen:  Diffusely tender, round s/p delivery Musculoskeletal:  No acute deformity or ROM limiation Skin:  Grossly intact  LABS:  BMET  Recent Labs Lab 12/13/15 1653 12/13/15 1722  NA 138 139  K 4.0 3.3*  CL 108 112*  CO2 20* 18*  BUN 25* 22*  CREATININE 2.49* 2.22*  GLUCOSE 117* 104*    Electrolytes  Recent Labs Lab 12/13/15 1653 12/13/15 1722  CALCIUM 8.0* 7.1*    CBC  Recent Labs Lab 12/13/15 1653 12/13/15 2211  WBC 15.8* 14.5*  HGB 4.9* 4.9*  HCT 14.9* 14.9*  PLT 171 151    Coag's  Recent Labs Lab 12/13/15 1745  APTT 51*  INR 1.49    Sepsis Markers  Recent Labs Lab 12/13/15 1724 12/14/15 0222  LATICACIDVEN 1.62 0.66    ABG No results for input(s): PHART, PCO2ART, PO2ART in the last 168 hours.  Liver Enzymes  Recent Labs Lab 12/13/15 1722  AST 22  ALT 18  ALKPHOS 84  BILITOT 2.0*  ALBUMIN 1.4*    Cardiac Enzymes No results for input(s): TROPONINI, PROBNP in the last 168 hours.  Glucose No results for input(s): GLUCAP in the last 168 hours.  Imaging Ct Abdomen Pelvis Wo Contrast  Result Date: 12/13/2015 CLINICAL DATA:  Initial evaluation for acute abdominal pain. Status post recent vaginal delivery. EXAM: CT ABDOMEN AND PELVIS WITHOUT CONTRAST TECHNIQUE: Multidetector CT imaging of the abdomen and pelvis was performed following the standard protocol without IV contrast. COMPARISON:  None available. FINDINGS: Lower chest: Mild scattered atelectatic changes present within the visualized lung bases. Visualized lung bases are otherwise clear. Diffusely decreased density within the cardiac blood pool suggestive of anemia. Changes related to pregnancy noted within the  bilateral breasts. Hepatobiliary: 16 mm hypodensity within the posterior right hepatic lobe present, most consistent with a cyst. Liver itself is somewhat enlarged. Limited noncontrast evaluation of the liver is otherwise unremarkable. Gallbladder within normal limits. No biliary dilatation. Pancreas: Noncontrast evaluation the pancreas is unremarkable. Spleen: Spleen is enlarged measuring 16.2 cm in craniocaudad dimension. Adrenals/Urinary Tract: Adrenal glands are grossly unremarkable. The left kidney is somewhat prominent as compared to the right with question of mild hydronephrosis. This may be related to the ongoing pelvic process. No nephrolithiasis bilaterally. Right kidney within normal limits. No definite hydroureter. Partially distended bladder unremarkable. Stomach/Bowel: Stomach within normal limits. No evidence for bowel obstruction. No acute inflammatory changes seen about the bowels. Vascular/Lymphatic: Limited evaluation of the vascular structures on this noncontrast examination. Question enlarged periaortic lymph nodes measuring up to 15 mm (series 2, image 34). No other definite adenopathy on this noncontrast examination. Reproductive: Possible small fibroid arising from the uterine fundus noted (series 2, image 58). There are bilateral somewhat tubular cystic lesions within both adnexa, measuring 14.2 x 5.7 cm on the right and 12.6 x 6.7 cm on the left. These lesions measure low height and intermediate fluid density (approximately 20-25 Hounsfield units). Ovaries themselves  not well delineated. Findings are suspected to reflect hydrosalpinges. Ovarian malignancy felt unlikely given the bilateral symmetric nature of this finding and patient age. Other: No free intraperitoneal air. Small volume free fluid within the abdomen and pelvis. Musculoskeletal: Mild diffuse anasarca noted. No acute osseous abnormality. No worrisome lytic or blastic osseous lesions. IMPRESSION: 1. Enlarged tubular cystic  structures involving the bilateral adnexa as above, suspected to reflect enlarged hydrosalpinges. These structures demonstrate low to intermediate fluid density, raising the possibility that some component of proteinaceous material (as may be expected with infection or possibly blood) may be present. Further evaluation with dedicated pelvic ultrasound may be helpful to further evaluate this finding. 2. Mild prominence of the left kidney with suspected mild left hydronephrosis. Finding may be secondary to the enlarged hydrosalpinges. Superimposed infection could also be considered. Correlation with urinalysis recommended. 3. Hepatosplenomegaly. 4. Small volume free fluid within the abdomen and pelvis with mild diffuse anasarca, suspected to be related to underlying intrinsic liver disease. 5. Enlarged periaortic adenopathy as above, not well delineated on this noncontrast examination. 6. Decreased density within the cardiac blood pool, suggesting anemia. Electronically Signed   By: Rise Mu M.D.   On: 12/13/2015 20:45   Dg Chest Portable 1 View  Result Date: 12/13/2015 CLINICAL DATA:  Shortness of breath for 2 days with central back pain EXAM: PORTABLE CHEST 1 VIEW COMPARISON:  None. FINDINGS: Cardiac shadow is at the upper limits of normal in size. The lungs are well aerated bilaterally. Mild bibasilar atelectatic changes are seen. No focal confluent infiltrate is noted. No bony abnormality is seen. IMPRESSION: Mild bibasilar atelectatic changes. Electronically Signed   By: Alcide Clever M.D.   On: 12/13/2015 18:08     STUDIES:  CT abd pelvis 10/31 > Enlarged tubular cystic structures involving the bilateral adnexa as above, suspected to reflect enlarged hydrosalpinges. These structures demonstrate low to intermediate fluid density, raising the possibility that some component of proteinaceous material (as may be expected with infection or possibly blood) may be present. Further evaluation with  dedicated pelvic ultrasound may be helpful to further evaluate this finding. Mild prominence of the left kidney with suspected mild left hydronephrosis. Finding may be secondary to the enlarged hydrosalpinges. Superimposed infection could also be considered. Correlation with urinalysis recommended. Hepatosplenomegaly. Small volume free fluid within the abdomen and pelvis with mild diffuse anasarca, suspected to be related to underlying intrinsic liver disease. Enlarged periaortic adenopathy as above, not well delineated on this noncontrast examination. Decreased density within the cardiac blood pool, suggesting anemia.  CULTURES: Blood 10/31 > Urine 10/31 >  ANTIBIOTICS: Zosyn 10/31 > Vancomycin 10/31 >  SIGNIFICANT EVENTS: 9/30 - 10/3 admit to [facility] for uncomplicated delivery. Epidural used. Estimated blood loss 200cc. 10/22 - to ED, diagnosed with UTI and discharged on Keflex.  LINES/TUBES:   DISCUSSION: 30 year old female one month out from a normal pregnancy and delivery. Now being admitted for septic shock. UTI on UA with pyelonephritis and hydronephrosis. Also anemia with hemoglobin 4.9. Will admit to ICU for blood products and broad-spectrum antibiotics with further diagnostics pending.  ASSESSMENT / PLAN:  PULMONARY A: Tachypnea > likely compensatory in setting metabolic acidosis in setting renal failure/shock  P:   Supplemental O2 as needed Monitor closely for intubation risk, think she will avoid this.  CARDIOVASCULAR A:  Hypotension/Shock -  septic vs hypovolemic vs hemorrhagic. Some degree chronic hypotension with discharge at time of DC 10/3 90s to 100s.   P:  Telemetry Blood product  administration favored over crystalloid based on hemoglobin, repeat pending. Can start low dose phenylephrine if needed to keep MAP > 60, however, blood products key here. Lactic acid and troponin wnl in ED May need CVL for CVP monitoring Co-ox 30cc/kg bolus given  ED  RENAL A:   L sided hydronephrosis Acute kidney injury  Hypokalemia  P:   Place foley Gentle IVF resuscitation Place foley Strict I&O Hydro mild, but may want to consider IR drain if clinically not improving.   GASTROINTESTINAL A:   Abd pain > likely due to hydro and pyelo  P:   NPO Pepcid daily for SUP in setting renal failure Needs transvaginal ultrasound  HEMATOLOGIC A:   Acute anemia. No obvious hemorrhagic source.   P:  Serial CBC Transfuse for Hgb < 7 Assess reticulocytes and peripheral smear.  INFECTIOUS A:   Sepsis/septic shock secondary to UTI, pyelo. Concern RPOC/endometriosis?  P:   Broad ABX as above with zosyn/vanc Follow cultures Further imaging as above  ENDOCRINE A:   No acute issues  P:   Follow glucose on BMP  NEUROLOGIC A:   No acute issues  P:   RASS goal: 0 Continue to monitor   FAMILY  - Updates: Patient and family updated bedside  - Inter-disciplinary family meet or Palliative Care meeting due by:  11/7   Joneen RoachPaul Hoffman, AGACNP-BC [facility] Pager [phone] or [phone]  12/14/2015 3:35 AM  Attending Note:  30 year old 4 wks post partum with pregnancy complicated only by anemia with Hg of 7 per patient, who had an uneventful vaginal delivery but continued to bleed for 3 wks post partum.  Stopped bleeding after 3 wks then went to the ER and was diagnosed with UTI and given keflex.  Bleeding resumed after.  Vaginal not urinary.  No history of liver disease, non-drinker, no history of Hep C or B.  No drug abuse history.  No tylenol use history.  Liver enlarged on exam with ascites.  UTI vs pyelo.  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  Pelvic U/S.  May need OB involved if above scans are uneventful.  Hypotension: no pressors, IVF resuscitation and blood transfusion.  Will send a hypercoagulable panel (even though no history of blood clots).  Check a hemolytic panel, hepatitis panel.   Vanc/zosyn.  This is a complicated case and further work up is needed.  Will be revisited by rounding MD.  Abdominal exam with ascites.  I reviewed abdominal CT myself, ascites, hepato-splenomegally noted and hydronephrosis.  The patient is critically ill with multiple organ systems failure and requires high complexity decision making for assessment and support, frequent evaluation and titration of therapies, application of advanced monitoring technologies and extensive interpretation of multiple databases.   Critical Care Time devoted to patient care services described in this note is  60  Minutes. This time reflects time of care of this signee Dr Koren BoundWesam Vineet Kinney. This critical care time does not reflect procedure time, or teaching time or supervisory time of PA/NP/Med student/Med Resident etc but could involve care discussion time.  Alyson ReedyWesam G. Adlai Sinning, M.D. [facility]/Critical Care Medicine. Pager: [phone]. After hours pager: [phone].

## 2015-12-14 NOTE — Progress Notes (Signed)
Pharmacy Antibiotic Note  Carolyn Lynn is a 30 y.o. female admitted on 12/13/2015 with abdominal pain/pyleonephritis/urosepsis.  Pharmacy has been consulted for Vancomycin and Zosyn dosing.  Vancomycin 1250 mg IV given in ED at 0240  Plan: Vancomycin 750 mg IV q24h Zosyn 3.375 g IV q8h   Height: 5\' 4"  (162.6 cm) Weight: 155 lb 6.8 oz (70.5 kg) IBW/kg (Calculated) : 54.7  Temp (24hrs), Avg:98 F (36.7 C), Min:97.3 F (36.3 C), Max:98.9 F (37.2 C)   Recent Labs Lab 12/13/15 1653 12/13/15 1722 12/13/15 1724 12/13/15 2211 12/14/15 0045 12/14/15 0222  WBC 15.8*  --   --  14.5* 10.9*  --   CREATININE 2.49* 2.22*  --   --  2.41*  --   LATICACIDVEN  --   --  1.62  --   --  0.66    Estimated Creatinine Clearance: 33.2 mL/min (by C-G formula based on SCr of 2.41 mg/dL (H)).    No Known Allergies  Carolyn Lynn, Carolyn Lynn 12/14/2015 5:16 AM

## 2015-12-15 ENCOUNTER — Inpatient Hospital Stay (HOSPITAL_COMMUNITY): Payer: Medicaid Other

## 2015-12-15 DIAGNOSIS — N949 Unspecified condition associated with female genital organs and menstrual cycle: Secondary | ICD-10-CM

## 2015-12-15 DIAGNOSIS — D649 Anemia, unspecified: Secondary | ICD-10-CM | POA: Insufficient documentation

## 2015-12-15 DIAGNOSIS — R0600 Dyspnea, unspecified: Secondary | ICD-10-CM

## 2015-12-15 DIAGNOSIS — A419 Sepsis, unspecified organism: Secondary | ICD-10-CM | POA: Diagnosis present

## 2015-12-15 LAB — CBC
HCT: 23 % — ABNORMAL LOW (ref 36.0–46.0)
HEMATOCRIT: 21.7 % — AB (ref 36.0–46.0)
HEMATOCRIT: 21.6 % — AB (ref 36.0–46.0)
HEMOGLOBIN: 7.2 g/dL — AB (ref 12.0–15.0)
HEMOGLOBIN: 7.6 g/dL — AB (ref 12.0–15.0)
Hemoglobin: 7.2 g/dL — ABNORMAL LOW (ref 12.0–15.0)
MCH: 25 pg — AB (ref 26.0–34.0)
MCH: 25.4 pg — ABNORMAL LOW (ref 26.0–34.0)
MCH: 25.1 pg — ABNORMAL LOW (ref 26.0–34.0)
MCHC: 33 g/dL (ref 30.0–36.0)
MCHC: 33.2 g/dL (ref 30.0–36.0)
MCHC: 33.3 g/dL (ref 30.0–36.0)
MCV: 75 fL — AB (ref 78.0–100.0)
MCV: 76.4 fL — ABNORMAL LOW (ref 78.0–100.0)
MCV: 75.9 fL — ABNORMAL LOW (ref 78.0–100.0)
PLATELETS: 185 10*3/uL (ref 150–400)
Platelets: 195 10*3/uL (ref 150–400)
Platelets: 231 10*3/uL (ref 150–400)
RBC: 2.84 MIL/uL — AB (ref 3.87–5.11)
RBC: 2.88 MIL/uL — AB (ref 3.87–5.11)
RBC: 3.03 MIL/uL — AB (ref 3.87–5.11)
RDW: 17.4 % — AB (ref 11.5–15.5)
RDW: 17.8 % — ABNORMAL HIGH (ref 11.5–15.5)
RDW: 17.8 % — ABNORMAL HIGH (ref 11.5–15.5)
WBC: 11.2 10*3/uL — AB (ref 4.0–10.5)
WBC: 11.7 10*3/uL — ABNORMAL HIGH (ref 4.0–10.5)
WBC: 11.8 10*3/uL — AB (ref 4.0–10.5)

## 2015-12-15 LAB — HOMOCYSTEINE: HOMOCYSTEINE-NORM: 4.3 umol/L (ref 0.0–15.0)

## 2015-12-15 LAB — HEPATITIS PANEL, ACUTE
HCV Ab: 0.1 s/co ratio (ref 0.0–0.9)
HEP A IGM: NEGATIVE
HEP B C IGM: NEGATIVE
Hepatitis B Surface Ag: NEGATIVE

## 2015-12-15 LAB — ECHOCARDIOGRAM COMPLETE
Height: 64 in
WEIGHTICAEL: 2624.36 [oz_av]

## 2015-12-15 LAB — DIFFERENTIAL
Basophils Absolute: 0 10*3/uL (ref 0.0–0.1)
Basophils Relative: 0 %
Eosinophils Absolute: 0 10*3/uL (ref 0.0–0.7)
Eosinophils Relative: 0 %
LYMPHS PCT: 10 %
Lymphs Abs: 1.2 10*3/uL (ref 0.7–4.0)
MONOS PCT: 6 %
Monocytes Absolute: 0.7 10*3/uL (ref 0.1–1.0)
NEUTROS ABS: 9.8 10*3/uL — AB (ref 1.7–7.7)
Neutrophils Relative %: 84 %
WBC MORPHOLOGY: INCREASED

## 2015-12-15 LAB — HEPATIC FUNCTION PANEL
ALBUMIN: 1.2 g/dL — AB (ref 3.5–5.0)
ALT: 17 U/L (ref 14–54)
AST: 23 U/L (ref 15–41)
Alkaline Phosphatase: 67 U/L (ref 38–126)
BILIRUBIN DIRECT: 1.4 mg/dL — AB (ref 0.1–0.5)
Indirect Bilirubin: 1 mg/dL — ABNORMAL HIGH (ref 0.3–0.9)
Total Bilirubin: 2.4 mg/dL — ABNORMAL HIGH (ref 0.3–1.2)
Total Protein: 5.6 g/dL — ABNORMAL LOW (ref 6.5–8.1)

## 2015-12-15 LAB — CARDIOLIPIN ANTIBODIES, IGG, IGM, IGA: Anticardiolipin IgM: 13 MPL U/mL — ABNORMAL HIGH (ref 0–12)

## 2015-12-15 LAB — PHOSPHORUS: PHOSPHORUS: 5.7 mg/dL — AB (ref 2.5–4.6)

## 2015-12-15 LAB — PROTEIN C, TOTAL: Protein C, Total: 38 % — ABNORMAL LOW (ref 60–150)

## 2015-12-15 LAB — FOLLICLE STIMULATING HORMONE: FSH: 3.2 m[IU]/mL

## 2015-12-15 LAB — HAPTOGLOBIN: HAPTOGLOBIN: 280 mg/dL — AB (ref 34–200)

## 2015-12-15 LAB — PROTEIN S ACTIVITY: Protein S Activity: 63 % (ref 63–140)

## 2015-12-15 LAB — PROTEIN C ACTIVITY: PROTEIN C ACTIVITY: 41 % — AB (ref 73–180)

## 2015-12-15 LAB — PROCALCITONIN: Procalcitonin: 5.18 ng/mL

## 2015-12-15 LAB — MAGNESIUM: Magnesium: 1.6 mg/dL — ABNORMAL LOW (ref 1.7–2.4)

## 2015-12-15 LAB — PROTEIN S, TOTAL: Protein S Ag, Total: 147 % (ref 60–150)

## 2015-12-15 LAB — BRAIN NATRIURETIC PEPTIDE: B NATRIURETIC PEPTIDE 5: 226.9 pg/mL — AB (ref 0.0–100.0)

## 2015-12-15 MED ORDER — FENTANYL 40 MCG/ML IV SOLN
INTRAVENOUS | Status: DC
  Administered 2015-12-15: 180 ug via INTRAVENOUS
  Administered 2015-12-15: 165 ug via INTRAVENOUS
  Administered 2015-12-15: 14:00:00 via INTRAVENOUS
  Administered 2015-12-16: 300 ug via INTRAVENOUS
  Administered 2015-12-16: 195 ug via INTRAVENOUS
  Administered 2015-12-16: 490 ug via INTRAVENOUS
  Administered 2015-12-16: 195 ug via INTRAVENOUS
  Administered 2015-12-16: 07:00:00 via INTRAVENOUS
  Administered 2015-12-16: 195 ug via INTRAVENOUS
  Administered 2015-12-17: 03:00:00 via INTRAVENOUS
  Administered 2015-12-17: 375 ug via INTRAVENOUS
  Administered 2015-12-17: 5.63 mL via INTRAVENOUS
  Administered 2015-12-18: 23:00:00 via INTRAVENOUS
  Administered 2015-12-18: 390 ug via INTRAVENOUS
  Administered 2015-12-18: 01:00:00 via INTRAVENOUS
  Administered 2015-12-19: 180 ug via INTRAVENOUS
  Administered 2015-12-19: 420 ug via INTRAVENOUS
  Administered 2015-12-19: 165 ug via INTRAVENOUS
  Filled 2015-12-15 (×5): qty 25

## 2015-12-15 MED ORDER — MAGNESIUM SULFATE 2 GM/50ML IV SOLN
2.0000 g | Freq: Once | INTRAVENOUS | Status: AC
  Administered 2015-12-15: 2 g via INTRAVENOUS
  Filled 2015-12-15: qty 50

## 2015-12-15 MED ORDER — SODIUM CHLORIDE 0.9% FLUSH: 9.0000 mL | INTRAVENOUS | Status: DC | PRN

## 2015-12-15 MED ORDER — FUROSEMIDE 10 MG/ML IJ SOLN
20.0000 mg | Freq: Once | INTRAMUSCULAR | Status: AC
  Administered 2015-12-15: 20 mg via INTRAVENOUS
  Filled 2015-12-15: qty 2

## 2015-12-15 MED ORDER — DIPHENHYDRAMINE HCL 50 MG/ML IJ SOLN: 12.5000 mg | Freq: Four times a day (QID) | INTRAMUSCULAR | Status: DC | PRN

## 2015-12-15 MED ORDER — PERFLUTREN LIPID MICROSPHERE
1.0000 mL | INTRAVENOUS | Status: AC | PRN
  Administered 2015-12-15: 2 mL via INTRAVENOUS
  Filled 2015-12-15: qty 10

## 2015-12-15 MED ORDER — ONDANSETRON HCL 4 MG/2ML IJ SOLN
4.0000 mg | Freq: Four times a day (QID) | INTRAMUSCULAR | Status: DC | PRN
  Administered 2015-12-17 – 2015-12-18 (×2): 4 mg via INTRAVENOUS
  Filled 2015-12-15 (×2): qty 2

## 2015-12-15 MED ORDER — FUROSEMIDE 10 MG/ML IJ SOLN
40.0000 mg | Freq: Once | INTRAMUSCULAR | Status: AC
  Administered 2015-12-15: 40 mg via INTRAVENOUS
  Filled 2015-12-15: qty 4

## 2015-12-15 MED ORDER — NALOXONE HCL 0.4 MG/ML IJ SOLN: 0.4000 mg | INTRAMUSCULAR | Status: DC | PRN

## 2015-12-15 MED ORDER — DIPHENHYDRAMINE HCL 12.5 MG/5ML PO ELIX
12.5000 mg | ORAL_SOLUTION | Freq: Four times a day (QID) | ORAL | Status: DC | PRN
  Filled 2015-12-15: qty 5

## 2015-12-15 NOTE — Progress Notes (Signed)
eLink Physician-Brief Progress Note Patient Name: Carolyn Lynn DOB: 08/27/1985 MRN: [id number]   Date of Service  12/15/2015  HPI/Events of Note  Multiple issues: 1. RR = 30's to 40's. CXR this AM c/w pulmonary congestion, 2. BP = 86/65 with MAP + 71. Echo reveals LVEF 40%-45% and last Hgb = 7.2  and 3. Bedside nurse concerned about transfer to stepdown bed. She feels that the patient requires closer observation.   eICU Interventions  Will order: 1. CBC now.  2. Lasix 40 mg IV now.  3. D/C Stepdown bed transfer.      Intervention Category Major Interventions: Other: Intermediate Interventions: Hypotension - evaluation and management  Vivien Barretto Eugene 12/15/2015, 5:42 PM

## 2015-12-15 NOTE — Progress Notes (Signed)
Per Cardiology. Pt EF 40-45%. 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 Cardiology to notify Ramaswamy.

## 2015-12-15 NOTE — Progress Notes (Signed)
Patient to be transferred to stepdown. [facility] to resume her care on 12/16/2015. Talked to [facility] hospital[facility], Dr. Joseph ArtWoods.

## 2015-12-15 NOTE — Progress Notes (Signed)
Pt c/o of back and abdominal pain. Heating packs given and repositioning offered. Pt tearful and rates pain 8. Informed Elink MD no new orders given at this time

## 2015-12-15 NOTE — Progress Notes (Addendum)
Pt hemoglobin 7.2. Elink Nurse and MD aware no new orders given at this time.

## 2015-12-15 NOTE — Progress Notes (Addendum)
Patient ID: Carolyn Lynn, female   DOB: 09-Nov-1985, 30 y.o.   MRN: [id number]  OB/GYN PROGRESS NOTE    Carolyn Lynn  WUJ:[id number] DOB: [birth year] DOA: 12/13/2015 PCP: No PCP Per Patient    Brief Narrative:  Patient is a 30 y.o. G49P1001 female who was admitted with hemorrhagic shock. Patient is 4 wks postpartum from an uncomplicated SVD 11/13/2015 following routine PNC. New OB labs WNL except for initial Hgb of 7.8. Also noted to have ovarian cyst on left on U/S at the [facility]. Patient reports minimal bleeding, which stopped 2 wks ago. She has been into the ED and MAU since delivery with pain and fatigue. Found and treated for UTI on 10/22 and grew Klebsiella, treated with negative repeat culture on 10/29. She presented to the ED on 10/31 for SOB, fatigue, feet swelling and abdominal distension. Her hgb noted to be 4.9, AKI, elevated bilirubin, abnormal coags and mildly elevated BNP noted. CT showed bilaterally enlarged tubes and mild hepatomegaly with left hydronephrosis. Patient found to be hypotensive and tachycardic. We are asked to see the patient regarding postpartum state, possible postpartum hemorrhage or possible endometritis.  Subjective: Patient has no new complaints. Continues to complain of abdominal tenderness, worse on left flank and in back. Denies bleeding. Starting PCA today. Workup continues to be relatively unrevealing.   Assessment & Plan:   Principal Problem:   Shock (HCC) Active Problems:   AKI (acute kidney injury) (HCC)   Jaundice   Hepatosplenomegaly   Anemia, iron deficiency   Mass of fallopian tube   Abnormal coagulation profile   Anemia   Sepsis (HCC)   1. Shock  Not on pressors. Pt being transferred to stepdown with transfer of care to [facility] 2. AKI  ? Pre-renal from shock vs other systemic illness  Slow improvement on IVF  Possibly nephrology consult or renal biopsy to rule out intrinsic renal disease?  C3/C4 pending  ? ANA 3. Anemia  S/p 5  units PRBC.  Hg 7.2 today - stable over 12 hours.  No evidence of OHSS, DIC, consumptive process.  No current bleeding  Coagulopathy workup pending. 4. Hepatosplenomegaly / Jaundice  Bilirubin improving.  GI consulted. 5. Hematosalpingies  Pain improving  No indication for drainage  Unsure of causality.  6. UTI  100K GNR - ID and sensitivities pending.  Continue zosyn for now    Procedures:   Echo pending  Antimicrobials:   Zosyn: Day 2  Vancomycin: Day 2     Objective: Vitals:   12/15/15 0800 12/15/15 0900 12/15/15 1100 12/15/15 1119  BP: 104/60 101/69 97/61   Pulse: 96 (!) 108 (!) 108   Resp: (!) 26 (!) 27 19   Temp:    98.3 F (36.8 C)  TempSrc:    Oral  SpO2: 95% 96% 99%   Weight:      Height:        Intake/Output Summary (Last 24 hours) at 12/15/15 1328 Last data filed at 12/15/15 1101  Gross per 24 hour  Intake          3159.83 ml  Output             1750 ml  Net          1409.83 ml   Filed Weights   12/13/15 1637 12/14/15 0500 12/15/15 0444  Weight: 146 lb (66.2 kg) 155 lb 6.8 oz (70.5 kg) 164 lb 0.4 oz (74.4 kg)    Examination:  General exam: Appears calm and  comfortable  Respiratory system: Clear to auscultation. Respiratory effort normal. Cardiovascular system: S1 & S2 heard, RRR. No JVD, murmurs, rubs, gallops or clicks. No pedal edema. Gastrointestinal system: Tender and distended. No organomegaly or masses felt. Normal bowel sounds heard. Central nervous system: Alert and oriented. No focal neurological deficits. Extremities: Symmetric 5 x 5 power. Skin: No rashes, lesions or ulcers Psychiatry: Judgement and insight appear normal. Mood & affect appropriate.     Data Reviewed: I have personally reviewed following labs and imaging studies  CBC:  Recent Labs Lab 12/14/15 0711 12/14/15 1450 12/14/15 1811 12/15/15 0011 12/15/15 1106  WBC 12.2* 14.2* 14.2* 11.7* 11.8*  NEUTROABS  --   --   --  9.8*  --   HGB 6.8*  8.2* 8.5* 7.2* 7.2*  HCT 20.4* 25.2* 25.8* 21.6* 21.7*  MCV 76.1* 76.8* 76.8* 75.0* 76.4*  PLT 173  179 195 200 185 195   Basic Metabolic Panel:  Recent Labs Lab 12/13/15 1653 12/13/15 1722 12/14/15 0045 12/14/15 0711 12/14/15 1450 12/15/15 0011  NA 138 139 138 139 139  --   K 4.0 3.3* 4.0 3.8 4.0  --   CL 108 112* 111 110 111  --   CO2 20* 18* 17* 19* 19*  --   GLUCOSE 117* 104* 93 83 93  --   BUN 25* 22* 24* 24* 24*  --   CREATININE 2.49* 2.22* 2.41* 2.39* 2.32*  --   CALCIUM 8.0* 7.1* 7.4* 7.8* 8.0*  --   MG  --   --   --  1.7  --  1.6*  PHOS  --   --   --  6.0*  --  5.7*   GFR: Estimated Creatinine Clearance: 35.4 mL/min (by C-G formula based on SCr of 2.32 mg/dL (H)). Liver Function Tests:  Recent Labs Lab 12/13/15 1722 12/14/15 0711 12/15/15 0011  AST 22  --  23  ALT 18  --  17  ALKPHOS 84  --  67  BILITOT 2.0* 3.2* 2.4*  PROT 5.6*  --  5.6*  ALBUMIN 1.4*  --  1.2*    Recent Labs Lab 12/13/15 1722  LIPASE 13   No results for input(s): AMMONIA in the last 168 hours. Coagulation Profile:  Recent Labs Lab 12/13/15 1745 12/14/15 0711  INR 1.49 1.44   Cardiac Enzymes:  Recent Labs Lab 12/14/15 0711 12/14/15 1450  TROPONINI 0.03*  <0.03 <0.03   BNP (last 3 results) No results for input(s): PROBNP in the last 8760 hours. HbA1C: No results for input(s): HGBA1C in the last 72 hours. CBG:  Recent Labs Lab 12/14/15 0512  GLUCAP 78   Lipid Profile: No results for input(s): CHOL, HDL, LDLCALC, TRIG, CHOLHDL, LDLDIRECT in the last 72 hours. Thyroid Function Tests:  Recent Labs  12/14/15 1450  TSH 3.534   Anemia Panel:  Recent Labs  12/13/15 1745 12/14/15 0711  TIBC 139*  --   IRON 6*  --   RETICCTPCT  --  2.7   Sepsis Labs:  Recent Labs Lab 12/13/15 1724 12/14/15 0222 12/14/15 0711 12/15/15 0011  PROCALCITON  --   --  5.82  5.83 5.18  LATICACIDVEN 1.62 0.66 0.9  --     Recent Results (from the past 240 hour(s))   Urine culture     Status: None   Collection Time: 12/11/15  8:40 AM  Result Value Ref Range Status   Specimen Description URINE, CLEAN CATCH  Final   Special Requests NONE  Final  Culture NO GROWTH Performed at [facility]   Final   Report Status 12/12/2015 FINAL  Final  Blood culture (routine x 2)     Status: None (Preliminary result)   Collection Time: 12/13/15  5:22 PM  Result Value Ref Range Status   Specimen Description BLOOD RIGHT ANTECUBITAL  Final   Special Requests BOTTLES DRAWN AEROBIC AND ANAEROBIC 5CC  Final   Culture NO GROWTH 2 DAYS  Final   Report Status PENDING  Incomplete  Blood culture (routine x 2)     Status: None (Preliminary result)   Collection Time: 12/13/15  5:45 PM  Result Value Ref Range Status   Specimen Description BLOOD LEFT ANTECUBITAL  Final   Special Requests IN PEDIATRIC BOTTLE 3CC  Final   Culture NO GROWTH 2 DAYS  Final   Report Status PENDING  Incomplete  Urine culture     Status: Abnormal (Preliminary result)   Collection Time: 12/13/15  8:31 PM  Result Value Ref Range Status   Specimen Description URINE, CLEAN CATCH  Final   Special Requests NONE  Final   Culture >=100,000 COLONIES/mL GRAM NEGATIVE RODS (A)  Final   Report Status PENDING  Incomplete  MRSA PCR Screening     Status: None   Collection Time: 12/14/15  5:11 AM  Result Value Ref Range Status   MRSA by PCR NEGATIVE NEGATIVE Final    Comment:        The GeneXpert MRSA Assay (FDA approved for NASAL specimens only), is one component of a comprehensive MRSA colonization surveillance program. It is not intended to diagnose MRSA infection nor to guide or monitor treatment for MRSA infections.          Radiology Studies: Ct Abdomen Pelvis Wo Contrast  Result Date: 12/13/2015 CLINICAL DATA:  Initial evaluation for acute abdominal pain. Status post recent vaginal delivery. EXAM: CT ABDOMEN AND PELVIS WITHOUT CONTRAST TECHNIQUE: Multidetector CT imaging of the  abdomen and pelvis was performed following the standard protocol without IV contrast. COMPARISON:  None available. FINDINGS: Lower chest: Mild scattered atelectatic changes present within the visualized lung bases. Visualized lung bases are otherwise clear. Diffusely decreased density within the cardiac blood pool suggestive of anemia. Changes related to pregnancy noted within the bilateral breasts. Hepatobiliary: 16 mm hypodensity within the posterior right hepatic lobe present, most consistent with a cyst. Liver itself is somewhat enlarged. Limited noncontrast evaluation of the liver is otherwise unremarkable. Gallbladder within normal limits. No biliary dilatation. Pancreas: Noncontrast evaluation the pancreas is unremarkable. Spleen: Spleen is enlarged measuring 16.2 cm in craniocaudad dimension. Adrenals/Urinary Tract: Adrenal glands are grossly unremarkable. The left kidney is somewhat prominent as compared to the right with question of mild hydronephrosis. This may be related to the ongoing pelvic process. No nephrolithiasis bilaterally. Right kidney within normal limits. No definite hydroureter. Partially distended bladder unremarkable. Stomach/Bowel: Stomach within normal limits. No evidence for bowel obstruction. No acute inflammatory changes seen about the bowels. Vascular/Lymphatic: Limited evaluation of the vascular structures on this noncontrast examination. Question enlarged periaortic lymph nodes measuring up to 15 mm (series 2, image 34). No other definite adenopathy on this noncontrast examination. Reproductive: Possible small fibroid arising from the uterine fundus noted (series 2, image 58). There are bilateral somewhat tubular cystic lesions within both adnexa, measuring 14.2 x 5.7 cm on the right and 12.6 x 6.7 cm on the left. These lesions measure low height and intermediate fluid density (approximately 20-25 Hounsfield units). Ovaries themselves not well delineated. Findings  are suspected to  reflect hydrosalpinges. Ovarian malignancy felt unlikely given the bilateral symmetric nature of this finding and patient age. Other: No free intraperitoneal air. Small volume free fluid within the abdomen and pelvis. Musculoskeletal: Mild diffuse anasarca noted. No acute osseous abnormality. No worrisome lytic or blastic osseous lesions. IMPRESSION: 1. Enlarged tubular cystic structures involving the bilateral adnexa as above, suspected to reflect enlarged hydrosalpinges. These structures demonstrate low to intermediate fluid density, raising the possibility that some component of proteinaceous material (as may be expected with infection or possibly blood) may be present. Further evaluation with dedicated pelvic ultrasound may be helpful to further evaluate this finding. 2. Mild prominence of the left kidney with suspected mild left hydronephrosis. Finding may be secondary to the enlarged hydrosalpinges. Superimposed infection could also be considered. Correlation with urinalysis recommended. 3. Hepatosplenomegaly. 4. Small volume free fluid within the abdomen and pelvis with mild diffuse anasarca, suspected to be related to underlying intrinsic liver disease. 5. Enlarged periaortic adenopathy as above, not well delineated on this noncontrast examination. 6. Decreased density within the cardiac blood pool, suggesting anemia. Electronically Signed   By: Rise MuBenjamin  McClintock M.D.   On: 12/13/2015 20:45   [place] Transvaginal Non-ob  Result Date: 12/14/2015 CLINICAL DATA:  Ultrasound was provided for use by the ordering physician, and a technical charge was applied by the performing facility.  No radiologist interpretation/professional services rendered.   [place] Pelvis Complete  Result Date: 12/14/2015 CLINICAL DATA:  4 weeks postpartum, severe anemia, adnexal masses on CT EXAM: TRANSABDOMINAL AND TRANSVAGINAL ULTRASOUND OF PELVIS TECHNIQUE: Both transabdominal and transvaginal ultrasound examinations of the pelvis  were performed. Transabdominal technique was performed for global imaging of the pelvis including uterus, ovaries, adnexal regions, and pelvic cul-de-sac. It was necessary to proceed with endovaginal exam following the transabdominal exam to visualize the bilateral adnexa. COMPARISON:  CT abdomen/pelvis dated 12/13/2015 FINDINGS: Uterus Measurements: 16.4 x 6.3 x 7.8 cm. No fibroids or other mass visualized. Endometrium Thickness: 17 mm.  No focal abnormality visualized. Right ovary Not discretely visualized. 14.5 x 7.3 x 8.6 cm complex cystic/tubular lesion with layering hemorrhage/debris in the right adnexa, favored to reflect hematosalpinx. Left ovary Not discretely visualized. 14.7 x 8.5 x 6.7 cm complex cystic/tubular lesion with layering hemorrhage/debris in the left adnexa, favored to reflect hematosalpinx. Other findings No abnormal free fluid. IMPRESSION: Large bilateral hematosalpinx, as above. Endometrial complex measures 17 mm, within normal limits. No gas on CT or ultrasound to suggest infection/endometritis. Postpartum hematosalpinx can be seen in the setting of uterine scarring related to instrumentation or infection, but it is unclear that either of those scenarios applying to this patient. Additionally, given the clinical picture and CT findings, an underlying coagulopathy (possibly related to hepatic dysfunction) is suspected, although the exact etiology remains unclear on imaging. These results were called by telephone at the time of interpretation on 12/14/2015 at 11:30 am to Dr. Jonette Evaonya Pratt, who verbally acknowledged these results. Electronically Signed   By: Charline BillsSriyesh  Krishnan M.D.   On: 12/14/2015 12:16   [place] Art/ven Flow Abd Pelv Doppler  Result Date: 12/14/2015 CLINICAL DATA:  Hepatomegaly. EXAM: DUPLEX ULTRASOUND OF LIVER TECHNIQUE: Color and duplex Doppler ultrasound was performed to evaluate the hepatic in-flow and out-flow vessels. COMPARISON:  Same day. FINDINGS: Portal Vein Velocities  Main:  42.9 cm/sec Right:  42.2 cm/sec Left:  43.7 cm/sec Hepatopetal flow is noted. Hepatic Vein Velocities Right:  42.2 cm/sec Middle:  54.1 cm/sec Left:  61.8 cm/sec Hepatofugal flow is noted  Hepatic Artery Velocity:  155.5 cm/sec Splenic Vein Velocity:  63.6 cm/sec Varices: None. Ascites: Minimal. Moderate splenomegaly is noted with maximum measured length of 18 cm and calculated volume of 1500 cubic cm. IMPRESSION: No evidence of portal, hepatic or splenic thrombosis or occlusion. Moderate splenomegaly. Minimal ascites. Electronically Signed   By: Lupita Raider, M.D.   On: 12/14/2015 12:28   Dg Chest Portable 1 View  Result Date: 12/13/2015 CLINICAL DATA:  Shortness of breath for 2 days with central back pain EXAM: PORTABLE CHEST 1 VIEW COMPARISON:  None. FINDINGS: Cardiac shadow is at the upper limits of normal in size. The lungs are well aerated bilaterally. Mild bibasilar atelectatic changes are seen. No focal confluent infiltrate is noted. No bony abnormality is seen. IMPRESSION: Mild bibasilar atelectatic changes. Electronically Signed   By: Alcide Clever M.D.   On: 12/13/2015 18:08   US Abdomen Limited Ruq  Result Date: 12/14/2015 CLINICAL DATA:  Hepatomegaly. EXAM: US ABDOMEN LIMITED - RIGHT UPPER QUADRANT COMPARISON:  CT scan of December 13, 2015. FINDINGS: Gallbladder: No gallstones are noted. Possible sludge is noted within gallbladder lumen. Moderate gallbladder wall thickening is noted measured at 5.8 mm. No pericholecystic fluid or sonographic Murphy's sign is noted. Common bile duct: Diameter: 2.5 mm which is within normal limits. Liver: 1.5 cm simple cyst is seen in right hepatic lobe. Liver appears to be enlarged. Within normal limits in parenchymal echogenicity. IMPRESSION: Probable sludge seen within gallbladder lumen with moderate gallbladder wall thickening. This may be due to adjacent hepatocellular disease. No gallstones are noted. Liver appears to be enlarged. Electronically  Signed   By: Lupita Raider, M.D.   On: 12/14/2015 11:52        Scheduled Meds: . famotidine (PEPCID) IV  20 mg Intravenous Q24H  . fentaNYL   Intravenous Q4H  . piperacillin-tazobactam (ZOSYN)  IV  3.375 g Intravenous Q8H  . vancomycin  750 mg Intravenous Q24H   Continuous Infusions: . sodium chloride 100 mL/hr at 12/15/15 0639     LOS: 1 day      Levie Heritage, DO 12/15/2015, 12:43 PM  Thank you so much for allowing [place] to participate in the care of this patient.  We will continue to follow with you. Please call the attending OB/GYN physician with questions or concerns at [phone].

## 2015-12-15 NOTE — Progress Notes (Signed)
  Echocardiogram 2D Echocardiogram has been performed with definity.  Carolyn Lynn 12/15/2015, 10:43 AM

## 2015-12-15 NOTE — Progress Notes (Signed)
[facility] PROGRESS NOTE Subjective patient feels about the same. She is complaining some pain in the abdomen but mostly back pain. This is on the left side.  Objective: Vital signs in last 24 hours: Temp:  [98.1 F (36.7 C)-99.8 F (37.7 C)] 98.5 F (36.9 C) (11/02 0724) Pulse Rate:  [91-120] 96 (11/02 0800) Resp:  [23-39] 26 (11/02 0800) BP: (81-104)/(49-73) 104/60 (11/02 0800) SpO2:  [91 %-100 %] 95 % (11/02 0800) Weight:  [74.4 kg (164 lb 0.4 oz)] 74.4 kg (164 lb 0.4 oz) (11/02 0444) Last BM Date: 12/13/15  Intake/Output from previous day: 11/01 0701 - 11/02 0700 In: 3224.8 [P.O.:840; I.V.:1775; Blood:359.8; IV Piggyback:250] Out: 1300 [Urine:1300] Intake/Output this shift: Total I/O In: 300 [I.V.:200; IV Piggyback:100] Out: 300 [Urine:300]  PE: General-- patient sitting in bed and no distress  Abdomen-- positive bowel sounds mild left-sided tenderness. Tender to percussion left flank.  Lab Results:  Recent Labs  12/14/15 0045 12/14/15 0711 12/14/15 1450 12/14/15 1811 12/15/15 0011  WBC 10.9* 12.2* 14.2* 14.2* 11.7*  HGB 5.2* 6.8* 8.2* 8.5* 7.2*  HCT 15.7* 20.4* 25.2* 25.8* 21.6*  PLT 151 173  179 195 200 185   BMET  Recent Labs  12/13/15 1653 12/13/15 1722 12/14/15 0045 12/14/15 0711 12/14/15 1450  NA 138 139 138 139 139  K 4.0 3.3* 4.0 3.8 4.0  CL 108 112* 111 110 111  CO2 20* 18* 17* 19* 19*  CREATININE 2.49* 2.22* 2.41* 2.39* 2.32*   LFT  Recent Labs  12/13/15 1722 12/14/15 0711 12/15/15 0011  PROT 5.6*  --  5.6*  AST 22  --  23  ALT 18  --  17  ALKPHOS 84  --  67  BILITOT 2.0* 3.2* 2.4*  BILIDIR  --  2.1* 1.4*  IBILI  --  1.1* 1.0*   PT/INR  Recent Labs  12/13/15 1745 12/14/15 0711  LABPROT 18.2* 17.7*  INR 1.49 1.44   PANCREAS  Recent Labs  12/13/15 1722  LIPASE 13         Studies/Results: Ct Abdomen Pelvis Wo Contrast  Result Date: 12/13/2015 CLINICAL DATA:  Initial evaluation for acute  abdominal pain. Status post recent vaginal delivery. EXAM: CT ABDOMEN AND PELVIS WITHOUT CONTRAST TECHNIQUE: Multidetector CT imaging of the abdomen and pelvis was performed following the standard protocol without IV contrast. COMPARISON:  None available. FINDINGS: Lower chest: Mild scattered atelectatic changes present within the visualized lung bases. Visualized lung bases are otherwise clear. Diffusely decreased density within the cardiac blood pool suggestive of anemia. Changes related to pregnancy noted within the bilateral breasts. Hepatobiliary: 16 mm hypodensity within the posterior right hepatic lobe present, most consistent with a cyst. Liver itself is somewhat enlarged. Limited noncontrast evaluation of the liver is otherwise unremarkable. Gallbladder within normal limits. No biliary dilatation. Pancreas: Noncontrast evaluation the pancreas is unremarkable. Spleen: Spleen is enlarged measuring 16.2 cm in craniocaudad dimension. Adrenals/Urinary Tract: Adrenal glands are grossly unremarkable. The left kidney is somewhat prominent as compared to the right with question of mild hydronephrosis. This may be related to the ongoing pelvic process. No nephrolithiasis bilaterally. Right kidney within normal limits. No definite hydroureter. Partially distended bladder unremarkable. Stomach/Bowel: Stomach within normal limits. No evidence for bowel obstruction. No acute inflammatory changes seen about the bowels. Vascular/Lymphatic: Limited evaluation of the vascular structures on this noncontrast examination. Question enlarged periaortic lymph nodes measuring up to 15 mm (series 2, image 34). No other definite adenopathy on this noncontrast examination. Reproductive: Possible small  fibroid arising from the uterine fundus noted (series 2, image 58). There are bilateral somewhat tubular cystic lesions within both adnexa, measuring 14.2 x 5.7 cm on the right and 12.6 x 6.7 cm on the left. These lesions measure low  height and intermediate fluid density (approximately 20-25 Hounsfield units). Ovaries themselves not well delineated. Findings are suspected to reflect hydrosalpinges. Ovarian malignancy felt unlikely given the bilateral symmetric nature of this finding and patient age. Other: No free intraperitoneal air. Small volume free fluid within the abdomen and pelvis. Musculoskeletal: Mild diffuse anasarca noted. No acute osseous abnormality. No worrisome lytic or blastic osseous lesions. IMPRESSION: 1. Enlarged tubular cystic structures involving the bilateral adnexa as above, suspected to reflect enlarged hydrosalpinges. These structures demonstrate low to intermediate fluid density, raising the possibility that some component of proteinaceous material (as may be expected with infection or possibly blood) may be present. Further evaluation with dedicated pelvic ultrasound may be helpful to further evaluate this finding. 2. Mild prominence of the left kidney with suspected mild left hydronephrosis. Finding may be secondary to the enlarged hydrosalpinges. Superimposed infection could also be considered. Correlation with urinalysis recommended. 3. Hepatosplenomegaly. 4. Small volume free fluid within the abdomen and pelvis with mild diffuse anasarca, suspected to be related to underlying intrinsic liver disease. 5. Enlarged periaortic adenopathy as above, not well delineated on this noncontrast examination. 6. Decreased density within the cardiac blood pool, suggesting anemia. Electronically Signed   By: Rise MuBenjamin  McClintock M.D.   On: 12/13/2015 20:45   [place] Transvaginal Non-ob  Result Date: 12/14/2015 CLINICAL DATA:  Ultrasound was provided for use by the ordering physician, and a technical charge was applied by the performing facility.  No radiologist interpretation/professional services rendered.   [place] Pelvis Complete  Result Date: 12/14/2015 CLINICAL DATA:  4 weeks postpartum, severe anemia, adnexal masses on CT  EXAM: TRANSABDOMINAL AND TRANSVAGINAL ULTRASOUND OF PELVIS TECHNIQUE: Both transabdominal and transvaginal ultrasound examinations of the pelvis were performed. Transabdominal technique was performed for global imaging of the pelvis including uterus, ovaries, adnexal regions, and pelvic cul-de-sac. It was necessary to proceed with endovaginal exam following the transabdominal exam to visualize the bilateral adnexa. COMPARISON:  CT abdomen/pelvis dated 12/13/2015 FINDINGS: Uterus Measurements: 16.4 x 6.3 x 7.8 cm. No fibroids or other mass visualized. Endometrium Thickness: 17 mm.  No focal abnormality visualized. Right ovary Not discretely visualized. 14.5 x 7.3 x 8.6 cm complex cystic/tubular lesion with layering hemorrhage/debris in the right adnexa, favored to reflect hematosalpinx. Left ovary Not discretely visualized. 14.7 x 8.5 x 6.7 cm complex cystic/tubular lesion with layering hemorrhage/debris in the left adnexa, favored to reflect hematosalpinx. Other findings No abnormal free fluid. IMPRESSION: Large bilateral hematosalpinx, as above. Endometrial complex measures 17 mm, within normal limits. No gas on CT or ultrasound to suggest infection/endometritis. Postpartum hematosalpinx can be seen in the setting of uterine scarring related to instrumentation or infection, but it is unclear that either of those scenarios applying to this patient. Additionally, given the clinical picture and CT findings, an underlying coagulopathy (possibly related to hepatic dysfunction) is suspected, although the exact etiology remains unclear on imaging. These results were called by telephone at the time of interpretation on 12/14/2015 at 11:30 am to Dr. Jonette Evaonya Pratt, who verbally acknowledged these results. Electronically Signed   By: Charline BillsSriyesh  Krishnan M.D.   On: 12/14/2015 12:16   [place] Art/ven Flow Abd Pelv Doppler  Result Date: 12/14/2015 CLINICAL DATA:  Hepatomegaly. EXAM: DUPLEX ULTRASOUND OF LIVER TECHNIQUE: Color and  duplex Doppler ultrasound was performed to evaluate the hepatic in-flow and out-flow vessels. COMPARISON:  Same day. FINDINGS: Portal Vein Velocities Main:  42.9 cm/sec Right:  42.2 cm/sec Left:  43.7 cm/sec Hepatopetal flow is noted. Hepatic Vein Velocities Right:  42.2 cm/sec Middle:  54.1 cm/sec Left:  61.8 cm/sec Hepatofugal flow is noted Hepatic Artery Velocity:  155.5 cm/sec Splenic Vein Velocity:  63.6 cm/sec Varices: None. Ascites: Minimal. Moderate splenomegaly is noted with maximum measured length of 18 cm and calculated volume of 1500 cubic cm. IMPRESSION: No evidence of portal, hepatic or splenic thrombosis or occlusion. Moderate splenomegaly. Minimal ascites. Electronically Signed   By: Lupita RaiderJames  Green Jr, M.D.   On: 12/14/2015 12:28   Dg Chest Portable 1 View  Result Date: 12/13/2015 CLINICAL DATA:  Shortness of breath for 2 days with central back pain EXAM: PORTABLE CHEST 1 VIEW COMPARISON:  None. FINDINGS: Cardiac shadow is at the upper limits of normal in size. The lungs are well aerated bilaterally. Mild bibasilar atelectatic changes are seen. No focal confluent infiltrate is noted. No bony abnormality is seen. IMPRESSION: Mild bibasilar atelectatic changes. Electronically Signed   By: Alcide CleverMark  Lukens M.D.   On: 12/13/2015 18:08   [place] Abdomen Limited Ruq  Result Date: 12/14/2015 CLINICAL DATA:  Hepatomegaly. EXAM: US ABDOMEN LIMITED - RIGHT UPPER QUADRANT COMPARISON:  CT scan of December 13, 2015. FINDINGS: Gallbladder: No gallstones are noted. Possible sludge is noted within gallbladder lumen. Moderate gallbladder wall thickening is noted measured at 5.8 mm. No pericholecystic fluid or sonographic Murphy's sign is noted. Common bile duct: Diameter: 2.5 mm which is within normal limits. Liver: 1.5 cm simple cyst is seen in right hepatic lobe. Liver appears to be enlarged. Within normal limits in parenchymal echogenicity. IMPRESSION: Probable sludge seen within gallbladder lumen with moderate  gallbladder wall thickening. This may be due to adjacent hepatocellular disease. No gallstones are noted. Liver appears to be enlarged. Electronically Signed   By: Lupita RaiderJames  Green Jr, M.D.   On: 12/14/2015 11:52    Medications: I have reviewed the patient's current medications.  Assessment/Plan: 1. Hepatosplenomegaly. Her bilirubin is up slightly but stable another liver test are okay with normal transaminases. Protime/INR was normal and hepatitis markers are negative. AP that all of the liver abnormalities are secondary to infection. 2. UTI. Was treated once with Keflex for Klebsiella but apparently still present repeat cultures pending 3. Acute renal failure. Patient receiving fluids 4. Profound anemia. No gross signs of G.I. bleeding but signs on ultrasound to some bleeding around her ovaries and she is postpartum. She is quite iron deficient that in the postpartum state difficult to know how much of this should be expected.  Recommendation: would continue to follow the liver test and treat UTI aggressively.   Eron Staat JR,Acacia Latorre L 12/15/2015, 9:15 AM  This note was created using voice recognition software. Minor errors may Have occurred unintentionally.  Pager: [phone] If no answer or after hours call [phone]

## 2015-12-15 NOTE — Progress Notes (Signed)
Pt remains tachypneic at rest and with exertion despite fentanyl PCA for c/o left lower back/flank pain, denies SOB or Chest pain. Unable to lay flat in bed. Distended neck veins, diminished breath sounds. Pt has to sit up in chair for ease of breathing. Tolerating RA with sats in high 90's. [facility] MD notified of high resp rate. See orders. Nursing to continue to monitor.

## 2015-12-15 NOTE — Progress Notes (Signed)
PULMONARY / CRITICAL CARE MEDICINE   Name: Carolyn Lynn MRN: [id number] DOB: [birth year]    ADMISSION DATE:  12/13/2015 CONSULTATION DATE:  12/14/2015  REFERRING MD:  Dr. Anitra Lauth EDP  CHIEF COMPLAINT:  Abd pain   BRIEF 30 year old female G1 P1 who is one month postpartum from an uncomplicated pregnancy, uncomplicated delivery, and uncomplicated postpartum course per her discharge summary from [facility] on 10/3. Of note, at time of discharge systolic blood pressure ranged from 90-103.She presented to the emergency department on 10/22 with complaints of fatigue, abdominal pain, and left-sided back pain. She was diagnosed with UTI, started on Keflex, discharged home.   She again presented to [facility] emergency department on 10/31 with similar complaints, including abdominal pain, back pain, and fatigue. Urinalysis again consistent with urinary tract infection. Hypotension again noted this time worse than her baseline with systolic blood pressures in the 70s. Hemoglobin found to be 4.9. She denies any obvious bleeding. She was administered 2 L of IV fluid and 2 units of PRBC. Systolic blood pressure improved to the low 90s. Lactic acid within normal limits. CT scan of her abdomen and pelvis demonstrated what is thought to be enlarged hydrosalpinges and a prominence of the left kidney with suspected mild left hydronephrosis. She was started on broad-spectrum antibiotics. Based on complicated medical picture as well as hypotension PCCM has been asked to admit.  Sig Events:  9/30 - 10/3 admit to [facility] for uncomplicated delivery. Epidural used. Estimated blood loss 200cc . 10/22 - to ED, diagnosed with UTI and discharged on Keflex.  CULTURES: 10/22 /17 - urine - klebsiella pna in ER 12/11/2015: Urine culture negative UA 10/31 with lg LE and nitrites Blood 10/31 >NGTD Urine 10/31 >  ANTIBIOTICS: Keflex 10/22 via ER >> 5 days (12/09/15 Zosyn 10/31 > Vancomycin 10/31  >     11/1:  LDH normal. Hpatogloin in process. No schistocytes. D-dimer 7.2. Fibrinogn 491. DAT antibodies - negative. Normmal LFT. Bili elevated to 3 but trending down. Retic count appears normal.  S/ 4 U prbc so far 4.9 -> 6.8  IMAGING x48hImagings: CT abd pelvis 10/31 >   Large bilateral hematosalpinx. Endometrial complex measures 17 mm, within normal limits. No gas on CT or ultrasound to suggest infection/endometritis.   Bilateral somewhat tubular cystic lesions within both adnexa, measuring 14.2 x 5.7 cm on the right and 12.6 x 6.7 cm on the left.  Hepatosplenomegaly.  [place] Art/ven Flow Abd Pelv Doppler 12/14/2015 :No evidence of portal, hepatic or splenic thrombosis or occlusion. Moderate splenomegaly. Minimal ascites.   Dg Chest Portable 1 View 12/13/2015: Mild bibasilar atelectatic changes.   US Abdomen Limited Ruq 12/14/2015: Probable sludge seen within gallbladder lumen with moderate gallbladder wall thickening. This may be due to adjacent hepatocellular disease. No gallstones are noted. Liver appears to be enlarged.   Cultures:  Bcx NGTD x2 Urine Cx pending  Hep panel negative  Antibiotics:  Vanc and Zosn 11/1>>   Subjective:  Patient sitting in the chair. Reports left sided back pain and abdominal pain. Denies dysuria or problem with BM. Last BM two days ago and normal.   VITAL SIGNS: BP 94/60   Pulse 93   Temp 98.5 F (36.9 C) (Oral)   Resp (!) 28   Ht 5\' 4"  (1.626 m)   Wt 74.4 kg (164 lb 0.4 oz)   SpO2 91%   BMI 28.15 kg/m   HEMODYNAMICS:  Not on pressors  VENTILATOR SETTINGS:  Not on Vent  INTAKE / OUTPUT: I/O last 3 completed shifts: In: 5814.8 [P.O.:840; I.V.:1975; Blood:1699.8; IV Piggyback:1300] Out: 1300 [Urine:1300]  PHYSICAL EXAMINATION: General:  Young female of normal body habitus in distress from pain Neuro:  Alert, oriented, nonfocal.  HEENT:  Normocephalic, atraumatic, PERRL, no JVD, pale conjuctiva Cardiovascular:  RRR, no MRG,  trace edema bilaterally Lungs:  Clear, rate 29, no distress Abdomen:  Diffusely tender, round s/p delivery, no CVA tenderness Musculoskeletal:  No acute deformity or ROM limiation Skin:  Grossly intact  LABS: PULMONARY  Recent Labs Lab 12/14/15 0348  PHART 7.372  PCO2ART 31.6*  PO2ART 85.0  HCO3 18.4*  TCO2 19  O2SAT 96.0    CBC  Recent Labs Lab 12/14/15 1450 12/14/15 1811 12/15/15 0011  HGB 8.2* 8.5* 7.2*  HCT 25.2* 25.8* 21.6*  WBC 14.2* 14.2* 11.7*  PLT 195 200 185    COAGULATION  Recent Labs Lab 12/13/15 1745 12/14/15 0711  INR 1.49 1.44    CARDIAC    Recent Labs Lab 12/14/15 0711 12/14/15 1450  TROPONINI 0.03*  <0.03 <0.03   No results for input(s): PROBNP in the last 168 hours.   CHEMISTRY  Recent Labs Lab 12/13/15 1653 12/13/15 1722 12/14/15 0045 12/14/15 0711 12/14/15 1450 12/15/15 0011  NA 138 139 138 139 139  --   K 4.0 3.3* 4.0 3.8 4.0  --   CL 108 112* 111 110 111  --   CO2 20* 18* 17* 19* 19*  --   GLUCOSE 117* 104* 93 83 93  --   BUN 25* 22* 24* 24* 24*  --   CREATININE 2.49* 2.22* 2.41* 2.39* 2.32*  --   CALCIUM 8.0* 7.1* 7.4* 7.8* 8.0*  --   MG  --   --   --  1.7  --  1.6*  PHOS  --   --   --  6.0*  --  5.7*   Estimated Creatinine Clearance: 35.4 mL/min (by C-G formula based on SCr of 2.32 mg/dL (H)).   LIVER  Recent Labs Lab 12/13/15 1722 12/13/15 1745 12/14/15 0711 12/15/15 0011  AST 22  --   --  23  ALT 18  --   --  17  ALKPHOS 84  --   --  67  BILITOT 2.0*  --  3.2* 2.4*  PROT 5.6*  --   --  5.6*  ALBUMIN 1.4*  --   --  1.2*  INR  --  1.49 1.44  --      INFECTIOUS  Recent Labs Lab 12/13/15 1724 12/14/15 0222 12/14/15 0711 12/15/15 0011  LATICACIDVEN 1.62 0.66 0.9  --   PROCALCITON  --   --  5.82  5.83 5.18     ENDOCRINE CBG (last 3)   Recent Labs  12/14/15 0512  GLUCAP 78    LINES/TUBES: PIV  DISCUSSION: 30 year old female one month out from a normal pregnancy and delivery.  Now being admitted for septic shock. UTI on UA with pyelonephritis and hydronephrosis. Also anemia with hemoglobin 4.9. Will admit to ICU for blood products and broad-spectrum antibiotics with further diagnostics pending.  ASSESSMENT / PLAN:  PULMONARY A: No distress  P:   Supplemental O2 as needed Monitor closely for intubation risk, think she will avoid this.  CARDIOVASCULAR A:  - Some degree chronic hypotension with discharge at time of DC 10/3 90s to 100s.   - Hypotension/Shock -  septic vs hypovolemic vs hemorrhagic. - at admit  - course 12/14/15 looking more like hypovolemic  v relative adrenal insuff  P:  - start neo for map goal > 65 - prbc for hgb > 7 or actively bleeding  -await cortisol - BNP - check echo   RENAL A:   L sided hydronephrosis - mild ? UTI related v mass effect Acute kidney injury    - likely improving 12/14/15 rounds . No foley  P:   Keep I/O track without foeley Gentle IVF resuscitation at 100cc/h Place foley Strict I&O Hydro mild, but may want to consider IR drain if clinically not improving.   GASTROINTESTINAL A:   Abd pain > likely due to hydro and pyelo. Also new hepatosplenomegaly on CT Hep panel within normal P:   Advance diet as tolerated per GI Pepcid daily for SUP in setting renal failure  HEMATOLOGIC A:   Acute anemia. Hgb 4.9>5uPRBCs>8.5>7.2.  No obvious hemorrhagic source but could have just been post partum hge No evidence of GI or hemolysis (though haptoglobin pending)  P:  Heme consult Serial CBC Follow up coagulopathy labs Transfuse for Hgb < 7   INFECTIOUS A:   12/04/15 - klebsiella UTI Blood cx NGTD Urine culture pending Admit 12/13/2015 - with shock ? cause  P:   Check PCT  Follow cultures Further imaging as above  ENDOCRINE A:   No acute issues  P:   Follow glucose on BMP  NEUROLOGIC A:   Left sided back pain likely due to hydronephrosis  P:   RASS goal: 0 Fentanyl as needed    GYN/OB A: post partum first child. Little boy Tory at home and being taken care of aunt. Reportedly doing well. No breast feedig P Appreciated OB/gyn recs  FAMILY  - Updates: Patient and family updated bedside 12/14/15    Candelaria Stagersaye Gonfa, MD.  Family Medicine Resident, PGY-2  12/15/2015 8:08 AM

## 2015-12-16 ENCOUNTER — Inpatient Hospital Stay (HOSPITAL_COMMUNITY): Payer: Medicaid Other

## 2015-12-16 DIAGNOSIS — R5383 Other fatigue: Secondary | ICD-10-CM

## 2015-12-16 DIAGNOSIS — N1339 Other hydronephrosis: Secondary | ICD-10-CM

## 2015-12-16 DIAGNOSIS — D649 Anemia, unspecified: Secondary | ICD-10-CM

## 2015-12-16 DIAGNOSIS — I5043 Acute on chronic combined systolic (congestive) and diastolic (congestive) heart failure: Secondary | ICD-10-CM | POA: Diagnosis present

## 2015-12-16 DIAGNOSIS — A419 Sepsis, unspecified organism: Secondary | ICD-10-CM

## 2015-12-16 DIAGNOSIS — I5041 Acute combined systolic (congestive) and diastolic (congestive) heart failure: Secondary | ICD-10-CM

## 2015-12-16 DIAGNOSIS — N938 Other specified abnormal uterine and vaginal bleeding: Secondary | ICD-10-CM

## 2015-12-16 DIAGNOSIS — N1330 Unspecified hydronephrosis: Secondary | ICD-10-CM

## 2015-12-16 DIAGNOSIS — R000 Tachycardia, unspecified: Secondary | ICD-10-CM

## 2015-12-16 DIAGNOSIS — I959 Hypotension, unspecified: Secondary | ICD-10-CM

## 2015-12-16 DIAGNOSIS — R162 Hepatomegaly with splenomegaly, not elsewhere classified: Secondary | ICD-10-CM

## 2015-12-16 DIAGNOSIS — B961 Klebsiella pneumoniae [K. pneumoniae] as the cause of diseases classified elsewhere: Secondary | ICD-10-CM | POA: Insufficient documentation

## 2015-12-16 DIAGNOSIS — I5021 Acute systolic (congestive) heart failure: Secondary | ICD-10-CM | POA: Insufficient documentation

## 2015-12-16 DIAGNOSIS — R0600 Dyspnea, unspecified: Secondary | ICD-10-CM

## 2015-12-16 DIAGNOSIS — B9689 Other specified bacterial agents as the cause of diseases classified elsewhere: Secondary | ICD-10-CM

## 2015-12-16 DIAGNOSIS — I429 Cardiomyopathy, unspecified: Secondary | ICD-10-CM

## 2015-12-16 DIAGNOSIS — O903 Peripartum cardiomyopathy: Secondary | ICD-10-CM

## 2015-12-16 DIAGNOSIS — N3090 Cystitis, unspecified without hematuria: Secondary | ICD-10-CM

## 2015-12-16 DIAGNOSIS — R160 Hepatomegaly, not elsewhere classified: Secondary | ICD-10-CM

## 2015-12-16 DIAGNOSIS — R609 Edema, unspecified: Secondary | ICD-10-CM

## 2015-12-16 DIAGNOSIS — N342 Other urethritis: Secondary | ICD-10-CM | POA: Insufficient documentation

## 2015-12-16 DIAGNOSIS — R109 Unspecified abdominal pain: Secondary | ICD-10-CM

## 2015-12-16 LAB — BASIC METABOLIC PANEL
ANION GAP: 10 (ref 5–15)
BUN: 24 mg/dL — ABNORMAL HIGH (ref 6–20)
CHLORIDE: 107 mmol/L (ref 101–111)
CO2: 18 mmol/L — ABNORMAL LOW (ref 22–32)
Calcium: 8.2 mg/dL — ABNORMAL LOW (ref 8.9–10.3)
Creatinine, Ser: 2.13 mg/dL — ABNORMAL HIGH (ref 0.44–1.00)
GFR calc non Af Amer: 30 mL/min — ABNORMAL LOW (ref >60)
GFR, EST AFRICAN AMERICAN: 35 mL/min — AB (ref >60)
Glucose, Bld: 81 mg/dL (ref 65–99)
POTASSIUM: 3.7 mmol/L (ref 3.5–5.1)
SODIUM: 135 mmol/L (ref 135–145)

## 2015-12-16 LAB — LUPUS ANTICOAGULANT PANEL
DRVVT: 64.8 s — ABNORMAL HIGH (ref 0.0–47.0)
PTT Lupus Anticoagulant: 54.4 s — ABNORMAL HIGH (ref 0.0–51.9)

## 2015-12-16 LAB — IRON AND TIBC
Iron: 22 ug/dL — ABNORMAL LOW (ref 28–170)
Saturation Ratios: 17 % (ref 10.4–31.8)
TIBC: 130 ug/dL — ABNORMAL LOW (ref 250–450)
UIBC: 108 ug/dL

## 2015-12-16 LAB — HEXAGONAL PHASE PHOSPHOLIPID: HEXAGONAL PHASE PHOSPHOLIPID: 7 s (ref 0–11)

## 2015-12-16 LAB — CBC
HCT: 23.7 % — ABNORMAL LOW (ref 36.0–46.0)
HEMOGLOBIN: 7.8 g/dL — AB (ref 12.0–15.0)
MCH: 24.8 pg — ABNORMAL LOW (ref 26.0–34.0)
MCHC: 32.9 g/dL (ref 30.0–36.0)
MCV: 75.5 fL — ABNORMAL LOW (ref 78.0–100.0)
Platelets: 216 10*3/uL (ref 150–400)
RBC: 3.14 MIL/uL — AB (ref 3.87–5.11)
RDW: 18.3 % — ABNORMAL HIGH (ref 11.5–15.5)
WBC: 10 10*3/uL (ref 4.0–10.5)

## 2015-12-16 LAB — DRVVT CONFIRM: DRVVT CONFIRM: 1.2 ratio (ref 0.8–1.2)

## 2015-12-16 LAB — PROTIME-INR
INR: 1.45
PROTHROMBIN TIME: 17.8 s — AB (ref 11.4–15.2)

## 2015-12-16 LAB — MAGNESIUM: MAGNESIUM: 2.2 mg/dL (ref 1.7–2.4)

## 2015-12-16 LAB — SEDIMENTATION RATE: Sed Rate: 104 mm/hr — ABNORMAL HIGH (ref 0–22)

## 2015-12-16 LAB — PHOSPHORUS: PHOSPHORUS: 5.6 mg/dL — AB (ref 2.5–4.6)

## 2015-12-16 LAB — BETA-2-GLYCOPROTEIN I ABS, IGG/M/A
Beta-2 Glyco I IgG: 12 GPI IgG units (ref 0–20)
Beta-2-Glycoprotein I IgA: <9 GPI IgA units (ref 0–25)
Beta-2-Glycoprotein I IgM: <9 GPI IgM units (ref 0–32)

## 2015-12-16 LAB — PTT-LA MIX: PTT-LA MIX: 51.1 s — AB (ref 0.0–48.9)

## 2015-12-16 LAB — FERRITIN: FERRITIN: 443 ng/mL — AB (ref 11–307)

## 2015-12-16 LAB — URINE CULTURE

## 2015-12-16 LAB — PROCALCITONIN: Procalcitonin: 3.81 ng/mL

## 2015-12-16 LAB — VITAMIN B12: Vitamin B-12: 3852 pg/mL — ABNORMAL HIGH (ref 180–914)

## 2015-12-16 LAB — DRVVT MIX: dRVVT Mix: 48.3 s — ABNORMAL HIGH (ref 0.0–47.0)

## 2015-12-16 LAB — C3 COMPLEMENT: C3 Complement: 107 mg/dL (ref 82–167)

## 2015-12-16 LAB — APTT: APTT: 51 s — AB (ref 24–36)

## 2015-12-16 LAB — C4 COMPLEMENT: COMPLEMENT C4, BODY FLUID: 17 mg/dL (ref 14–44)

## 2015-12-16 MED ORDER — FUROSEMIDE 10 MG/ML IJ SOLN: 40.0000 mg | Freq: Three times a day (TID) | INTRAMUSCULAR | Status: DC

## 2015-12-16 MED ORDER — FAMOTIDINE IN NACL 20-0.9 MG/50ML-% IV SOLN
20.0000 mg | Freq: Two times a day (BID) | INTRAVENOUS | Status: DC
  Administered 2015-12-16 – 2015-12-17 (×3): 20 mg via INTRAVENOUS
  Filled 2015-12-16 (×3): qty 50

## 2015-12-16 MED ORDER — DEXTROSE 5 % IV SOLN
1.0000 g | INTRAVENOUS | Status: DC
  Administered 2015-12-16 – 2015-12-19 (×4): 1 g via INTRAVENOUS
  Filled 2015-12-16 (×4): qty 10

## 2015-12-16 MED ORDER — FUROSEMIDE 10 MG/ML IJ SOLN
40.0000 mg | Freq: Once | INTRAMUSCULAR | Status: AC
  Administered 2015-12-16: 40 mg via INTRAVENOUS
  Filled 2015-12-16: qty 4

## 2015-12-16 MED ORDER — FUROSEMIDE 10 MG/ML IJ SOLN
40.0000 mg | Freq: Three times a day (TID) | INTRAMUSCULAR | Status: DC
  Administered 2015-12-16 (×2): 40 mg via INTRAVENOUS
  Filled 2015-12-16 (×3): qty 4

## 2015-12-16 MED ORDER — HYDRALAZINE HCL 20 MG/ML IJ SOLN: 10.0000 mg | Freq: Once | INTRAMUSCULAR | Status: DC

## 2015-12-16 MED ORDER — ACETAMINOPHEN 325 MG PO TABS
650.0000 mg | ORAL_TABLET | Freq: Four times a day (QID) | ORAL | Status: DC | PRN
Start: 2015-12-16 — End: 2015-12-27
  Administered 2015-12-17 – 2015-12-27 (×10): 650 mg via ORAL
  Filled 2015-12-16 (×11): qty 2

## 2015-12-16 NOTE — Progress Notes (Signed)
Patient arrived to [id number].  VS stable. Will continue to monitor.

## 2015-12-16 NOTE — Progress Notes (Signed)
PULMONARY / CRITICAL CARE MEDICINE   Name: Carolyn Lynn MRN: [id number] DOB: December 25, 1985    ADMISSION DATE:  12/13/2015 CONSULTATION DATE:  12/14/2015  REFERRING MD:  Dr. Anitra Lauth EDP  CHIEF COMPLAINT:  Abd pain   BRIEF 30 year old female G1 P1 who is one month postpartum from an uncomplicated pregnancy, uncomplicated delivery, and uncomplicated postpartum course per her discharge summary from [facility] on 10/3. Of note, at time of discharge systolic blood pressure ranged from 90-103.She presented to the emergency department on 10/22 with complaints of fatigue, abdominal pain, and left-sided back pain. She was diagnosed with UTI, started on Keflex, discharged home.   She again presented to [facility] emergency department on 10/31 with similar complaints, including abdominal pain, back pain, and fatigue. Urinalysis again consistent with urinary tract infection. Hypotension again noted this time worse than her baseline with systolic blood pressures in the 70s. Hemoglobin found to be 4.9. She denies any obvious bleeding. She was administered 2 L of IV fluid and 2 units of PRBC. Systolic blood pressure improved to the low 90s. Lactic acid within normal limits. CT scan of her abdomen and pelvis demonstrated what is thought to be enlarged hydrosalpinges and a prominence of the left kidney with suspected mild left hydronephrosis. She was started on broad-spectrum antibiotics. Based on complicated medical picture as well as hypotension PCCM has been asked to admit.   CULTURES: 10/22 /17 - urine - klebsiella pna in ER 12/11/2015: Urine culture negative UA 10/31 with lg LE and nitrites Blood 10/31 >NGTD Urine 10/31 > K pneumonia  ANTIBIOTICS: Keflex 10/22 via ER >> 5 days (12/09/15 Zosyn 10/31 > Vancomycin 10/31 >  11/1:  LDH normal. Hpatogloin in process. No schistocytes. D-dimer 7.2. Fibrinogn 491. DAT antibodies - negative. Normmal LFT. Bili elevated to 3 but trending down. Retic count  appears normal.  S/ 4 U prbc so far 4.9 -> 6.8  IMAGINGs:   CT abd pelvis 10/31 >   Large bilateral hematosalpinx. Endometrial complex measures 17 mm, within normal limits. No gas on CT or ultrasound to suggest infection/endometritis.   Bilateral somewhat tubular cystic lesions within both adnexa, measuring 14.2 x 5.7 cm on the right and 12.6 x 6.7 cm on the left.  Hepatosplenomegaly.  [place] Art/ven Flow Abd Pelv Doppler 12/14/2015 :No evidence of portal, hepatic or splenic thrombosis or occlusion. Moderate splenomegaly. Minimal ascites.   Dg Chest Portable 1 View 12/13/2015: Mild bibasilar atelectatic changes.   US Abdomen Limited Ruq 12/14/2015: Probable sludge seen within gallbladder lumen with moderate gallbladder wall thickening. This may be due to adjacent hepatocellular disease. No gallstones are noted. Liver appears to be enlarged.   Cultures:  Bcx NGTD x2  Urine Cx K pneumonia  Hep panel negative  Echo 11/2: EF 40-45%, mild diffuse hypokinesis  Significant Events:  9/30 - 10/3 admit to [facility] for uncomplicated delivery. Epidural used. Estimated blood loss 200cc  10/22 - to ED, diagnosed with UTI and discharged on Keflex.  11/2: transferred to step-down but patient wasn't stable enough this morning due to low BP, high RR, new HF and low Hgb  Antibiotics:  Vanc and Zosn 11/1>>  Subjective:  Reports intermittent epigastric pain that she describes as sharp. Pain every 20 minutes since last night. Denies chest pain but reports dyspnea and orthopnea. Also constant left sided flank and back pain.  Transfer was canceled. Diuresed with IV lasix 20, 40 and 40 mg so far. BMP stable. Sating well on RA.   VITAL  SIGNS: BP (!) 96/57 (BP Location: Left Arm)   Pulse (!) 105   Temp 97.7 F (36.5 C) (Oral)   Resp (!) 25   Ht 5\' 4"  (1.626 m)   Wt 72.6 kg (160 lb 0.9 oz)   SpO2 98%   BMI 27.47 kg/m   HEMODYNAMICS:  Not on pressors  VENTILATOR SETTINGS:  Not on  Vent  INTAKE / OUTPUT: I/O last 3 completed shifts: In: 2520 [P.O.:240; I.V.:1680; IV Piggyback:600] Out: 3675 [Urine:3675]  PHYSICAL EXAMINATION: General:  Young female of normal body habitus in distress from pain Neuro:  Alert, oriented, nonfocal.  HEENT:  Normocephalic, atraumatic, PERRL, no JVD, pale conjuctiva Cardiovascular:  RRR, no MRG, +1 edema bilaterally in LE Lungs:  [place] in place, clear, reduced air movement bilaterally, no distress Abdomen:  Epigastric pain, diffusely tender, round s/p delivery, no CVA tenderness Musculoskeletal:  No acute deformity or ROM limiation Skin:  Grossly intact LABS: PULMONARY  Recent Labs Lab 12/14/15 0348  PHART 7.372  PCO2ART 31.6*  PO2ART 85.0  HCO3 18.4*  TCO2 19  O2SAT 96.0    CBC  Recent Labs Lab 12/15/15 1106 12/15/15 1842 12/16/15 0211  HGB 7.2* 7.6* 7.8*  HCT 21.7* 23.0* 23.7*  WBC 11.8* 11.2* 10.0  PLT 195 231 216    COAGULATION  Recent Labs Lab 12/13/15 1745 12/14/15 0711  INR 1.49 1.44    CARDIAC    Recent Labs Lab 12/14/15 0711 12/14/15 1450  TROPONINI 0.03*  <0.03 <0.03   No results for input(s): PROBNP in the last 168 hours.   CHEMISTRY  Recent Labs Lab 12/13/15 1722 12/14/15 0045 12/14/15 0711 12/14/15 1450 12/15/15 0011 12/16/15 0211  NA 139 138 139 139  --  135  K 3.3* 4.0 3.8 4.0  --  3.7  CL 112* 111 110 111  --  107  CO2 18* 17* 19* 19*  --  18*  GLUCOSE 104* 93 83 93  --  81  BUN 22* 24* 24* 24*  --  24*  CREATININE 2.22* 2.41* 2.39* 2.32*  --  2.13*  CALCIUM 7.1* 7.4* 7.8* 8.0*  --  8.2*  MG  --   --  1.7  --  1.6* 2.2  PHOS  --   --  6.0*  --  5.7* 5.6*   Estimated Creatinine Clearance: 38.1 mL/min (by C-G formula based on SCr of 2.13 mg/dL (H)).   LIVER  Recent Labs Lab 12/13/15 1722 12/13/15 1745 12/14/15 0711 12/15/15 0011  AST 22  --   --  23  ALT 18  --   --  17  ALKPHOS 84  --   --  67  BILITOT 2.0*  --  3.2* 2.4*  PROT 5.6*  --   --  5.6*   ALBUMIN 1.4*  --   --  1.2*  INR  --  1.49 1.44  --      INFECTIOUS  Recent Labs Lab 12/13/15 1724 12/14/15 0222 12/14/15 0711 12/15/15 0011 12/16/15 0211  LATICACIDVEN 1.62 0.66 0.9  --   --   PROCALCITON  --   --  5.82  5.83 5.18 3.81     ENDOCRINE CBG (last 3)   Recent Labs  12/14/15 0512  GLUCAP 78    LINES/TUBES: PIV  DISCUSSION: 30 year old female one month out from a normal pregnancy and delivery. Now being admitted for septic shock. UTI on UA with pyelonephritis and hydronephrosis. Also anemia with hemoglobin 4.9. Will admit to ICU for blood products  and broad-spectrum antibiotics with further diagnostics pending.  ASSESSMENT / PLAN:  PULMONARY A: No distress  P:   Supplemental O2 as needed Monitor closely for intubation risk, think she will avoid this.  CARDIOVASCULAR A:  - Some degree chronic hypotension with discharge at time of DC 10/3 90s to 100s.   - Hypotension/Shock -  septic vs hypovolemic vs hemorrhagic. - at admit  - course 12/14/15 looking more like hypovolemic v relative adrenal insuff -PP CM EF 40-45%, diffuse hypokinesis, BNP 226  P:  - start neo for map < 65 - prbc for hgb > 7 or actively bleeding  -await cortisol  RENAL A:   L sided hydronephrosis - mild ? UTI related v mass effect Acute kidney injury    - likely improving 12/14/15 rounds . No foley  P:   Keep I/O track without foeley Gentle IVF resuscitation at 100cc/h Place foley Strict I&O Hydro mild, but may want to consider IR drain if clinically not improving.   GASTROINTESTINAL A:   Abd pain > likely due to hydro and pyelo. Also new hepatosplenomegaly on CT Hep panel within normal P:   Advance diet as tolerated per GI Pepcid daily  HEMATOLOGIC A:   Acute anemia. Hgb 4.9>5uPRBCs>8.5>7.2>7.8.  No obvious hemorrhagic source but could have just been post partum hge No evidence of GI or hemolysis (though haptoglobin pending)  P:  Heme consulted but  didn't see pt Daily CBC Follow up coagulopathy labs Transfuse for Hgb < 7   INFECTIOUS A:   12/04/15 - klebsiella UTI Blood cx NGTD Urine culture: K. Pneumonia Admit 12/13/2015 - with shock ? cause  P:   Check PCT  Further imaging as above Narrow Antibiotics to CTX  ENDOCRINE A:   No acute issues  P:   Follow glucose on BMP  NEUROLOGIC A:   Left sided back pain likely due to hydronephrosis  P:   RASS goal: 0 PCA  GYN/OB A: post partum first child. Little boy Tory at home and being taken care of aunt. Reportedly doing well. No breast feedig P Appreciated OB/gyn recs  FAMILY  - Updates: Patient and family updated bedside 12/14/15    Candelaria Stagersaye Gonfa, MD.  Family Medicine Resident, PGY-2  12/16/2015 8:29 AM

## 2015-12-16 NOTE — Progress Notes (Addendum)
Hematology Note  Patient was seen this AM Presented with abdominal pain, sepsis like picture with   1 month post-partum after uneventful pregnancy. Patient with long history of iron deficiency anemia though hgb 12.7 on 10/1. Patient notes that she had postpostum bleeding and some foul smelling discharge. Labs on admission with hgb 4.9 with microcytic indices concerning for Iron def vs significant chronic inflammation\  Noted to have abnormal PT and PPT and decreased ATIII and increased d dimer consistent with DIC likely related to sepsis (elevated procalcitonin) ? Gyn source of sepsis vs hepatobiliary. Hepato-splenomegaly likely from infection.  No evidence of hemolysis - normal LDH and haptoglobin No associated thrombocytopenia to suggest CAPS or MAHA.  Has some fhx of Sickle cell disease but is not aware of personal diagnosis (if present would need to r/o hepatic and splenic sequestration) Plan -rpt coags -ferritin iron profile -hemoglobin electrophoresis -evaluation and management of sepsis/source of sepsis -cold agglutinin titers -have requested smear on admission labs- pending.  Wyvonnia Lora MD MS   .    HEMATOLOGY/ONCOLOGY CONSULTATION NOTE  Date of Service: 12/17/2015  Patient Care Team: No Pcp Per Patient as PCP - General (General Practice)  CHIEF COMPLAINTS/PURPOSE OF CONSULTATION:  Anemia  HISTORY OF PRESENTING ILLNESS:   Carolyn Lynn is a wonderful 30 y.o. female who has been referred to [place] by Dr Drema Dallas, MD  for evaluation and management of anemia.  She has no significant PMH and is about 1 month post-partum after an apparently uneventful pregnancy and delivery. She was seen in the ED on 10/22 for a Klebsiella pneumoniae UTI treated with Keflex.  She subsequently to [facility] ED on 10/31 with fatigue, dyspnea, new b/l leg swelling and some abdominal pain and distension. She was noted to be hypotensive, tachycardic with a Hgb of 4.9 and microcytic  indices. She report have heavier post-partum vaginal bleeding for about 2 weeks.  No overt GI bleeding/hematuria/epistaxis or other overt bleeding.  Other lab workup showed AKI, abnormal coags, elevated bilirubin, elevated procalcitonin concerning for sepsis without clear source (?hepatobliary/uterine/urinary) with possible mild DIC. No evidence of hemolysis on labs. No other associated cytopenias.  She was admitted to the medical ICU due to concern for hemorrhagic versus septic shock.  Imaging and U/S studies revealed bilateral large hydrosalpinxes vs hematosalpinx vs pyosalpinx measuring 14.2 x 5.7 cm on the right and 12.6 x 6.7 cm on the left. ? Borderline possible left hydronephrosis. Mild hepatosplenomegaly with some poorly defined peri-aortic LNadenopathy.  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   MEDICAL HISTORY:  Past Medical History:  Diagnosis Date  . Medical history non-contributory     SURGICAL HISTORY: Past Surgical History:  Procedure Laterality Date  . NO PAST SURGERIES      SOCIAL HISTORY: Social History   Social History  . Marital status: Single    Spouse name: N/A  . Number of children: N/A  . Years of education: N/A   Occupational History  . Not on file.   Social History Main Topics  . Smoking status: Never Smoker  . Smokeless tobacco: Never Used  . Alcohol use No  . Drug use: No  . Sexual activity: Yes    Birth control/ protection: None   Other Topics Concern  . Not on file   Social History Narrative  . No narrative on file    FAMILY HISTORY: History reviewed. No pertinent  family history.  ALLERGIES:  has No Known Allergies.  MEDICATIONS:  Current Facility-Administered Medications  Medication Dose Route Frequency Provider Last Rate Last Dose  . 0.9 %  sodium chloride infusion   Intravenous Continuous Almon Herculesaye T Gonfa, MD 10 mL/hr at  12/16/15 1004    . acetaminophen (TYLENOL) tablet 650 mg  650 mg Oral Q6H PRN Leda GauzeKaren J Kirby-Graham, NP   650 mg at 12/17/15 0001  . cefTRIAXone (ROCEPHIN) 1 g in dextrose 5 % 50 mL IVPB  1 g Intravenous Q24H Almon Herculesaye T Gonfa, MD   1 g at 12/16/15 1213  . diphenhydrAMINE (BENADRYL) injection 12.5 mg  12.5 mg Intravenous Q6H PRN Almon Herculesaye T Gonfa, MD       Or  . diphenhydrAMINE (BENADRYL) 12.5 MG/5ML elixir 12.5 mg  12.5 mg Oral Q6H PRN Almon Herculesaye T Gonfa, MD      . famotidine (PEPCID) IVPB 20 mg premix  20 mg Intravenous Q12H Carman ChingJames Edwards, MD   20 mg at 12/16/15 2149  . fentaNYL (SUBLIMAZE) injection 25-50 mcg  25-50 mcg Intravenous Q2H PRN Merwyn Katosavid B Simonds, MD   50 mcg at 12/15/15 1101  . fentaNYL 40 mcg/mL PCA injection   Intravenous Q4H Almon Herculesaye T Gonfa, MD      . furosemide (LASIX) injection 40 mg  40 mg Intravenous TID Cherre HugerKatherine R Cook, RPH   40 mg at 12/16/15 2149  . naloxone [facility](NARCAN) injection 0.4 mg  0.4 mg Intravenous PRN Almon Herculesaye T Gonfa, MD       And  . sodium chloride flush (NS) 0.9 % injection 9 mL  9 mL Intravenous PRN Almon Herculesaye T Gonfa, MD      . ondansetron (ZOFRAN) injection 4 mg  4 mg Intravenous Q6H PRN Almon Herculesaye T Gonfa, MD        REVIEW OF SYSTEMS:    10 Point review of Systems was done is negative except as noted above.  PHYSICAL EXAMINATION: ECOG PERFORMANCE STATUS: 2 - Symptomatic, <50% confined to bed  . Vitals:   12/16/15 2352 12/17/15 0133  BP:    Pulse:    Resp: (!) 26   Temp:  99.2 F (37.3 C)   Filed Weights   12/15/15 0444 12/16/15 0405 12/16/15 0922  Weight: 164 lb 0.4 oz (74.4 kg) 160 lb 0.9 oz (72.6 kg) 160 lb 0.9 oz (72.6 kg)   .Body mass index is 27.47 kg/m.  GENERAL:alert, in no acute distress and comfortable SKIN: skin color, texture, turgor are normal, no rashes  EYES:conjunctival pallor  OROPHARYNX:no exudate, no erythema and lips, buccal mucosa, and tongue normal  NECK: supple, no JVD, thyroid normal size, non-tender, without nodularity LYMPH:  no palpable  lymphadenopathy in the cervical, axillary or inguinal LUNGS: clear to auscultation with normal respiratory effort HEART: regular rate & rhythm,  no murmurs and no lower extremity edema ABDOMEN: abdomen mildly distended with some TTP in mid/lower abdomen. Musculoskeletal: no cyanosis of digits and no clubbing  PSYCH: alert & oriented x 3 with fluent speech NEURO: no focal motor/sensory deficits  LABORATORY DATA:  I have reviewed the data as listed  . CBC Latest Ref Rng & Units 12/16/2015 12/15/2015 12/15/2015  WBC 4.0 - 10.5 K/uL 10.0 11.2(H) 11.8(H)  Hemoglobin 12.0 - 15.0 g/dL 7.8(L) 7.6(L) 7.2(L)  Hematocrit 36.0 - 46.0 % 23.7(L) 23.0(L) 21.7(L)  Platelets 150 - 400 K/uL 216 231 195    . CMP Latest Ref Rng & Units 12/16/2015 12/15/2015 12/14/2015  Glucose 65 - 99 mg/dL 81 - 93  BUN 6 - 20 mg/dL 96(E) - 95(M)  Creatinine 0.44 - 1.00 mg/dL 8.41(L) - 2.44(W)  Sodium 135 - 145 mmol/L 135 - 139  Potassium 3.5 - 5.1 mmol/L 3.7 - 4.0  Chloride 101 - 111 mmol/L 107 - 111  CO2 22 - 32 mmol/L 18(L) - 19(L)  Calcium 8.9 - 10.3 mg/dL 8.2(L) - 8.0(L)  Total Protein 6.5 - 8.1 g/dL - 5.6(L) -  Total Bilirubin 0.3 - 1.2 mg/dL - 2.4(H) -  Alkaline Phos 38 - 126 U/L - 67 -  AST 15 - 41 U/L - 23 -  ALT 14 - 54 U/L - 17 -     RADIOGRAPHIC STUDIES: I have personally reviewed the radiological images as listed and agreed with the findings in the report. Ct Abdomen Pelvis Wo Contrast  Result Date: 12/13/2015 CLINICAL DATA:  Initial evaluation for acute abdominal pain. Status post recent vaginal delivery. EXAM: CT ABDOMEN AND PELVIS WITHOUT CONTRAST TECHNIQUE: Multidetector CT imaging of the abdomen and pelvis was performed following the standard protocol without IV contrast. COMPARISON:  None available. FINDINGS: Lower chest: Mild scattered atelectatic changes present within the visualized lung bases. Visualized lung bases are otherwise clear. Diffusely decreased density within the cardiac blood pool  suggestive of anemia. Changes related to pregnancy noted within the bilateral breasts. Hepatobiliary: 16 mm hypodensity within the posterior right hepatic lobe present, most consistent with a cyst. Liver itself is somewhat enlarged. Limited noncontrast evaluation of the liver is otherwise unremarkable. Gallbladder within normal limits. No biliary dilatation. Pancreas: Noncontrast evaluation the pancreas is unremarkable. Spleen: Spleen is enlarged measuring 16.2 cm in craniocaudad dimension. Adrenals/Urinary Tract: Adrenal glands are grossly unremarkable. The left kidney is somewhat prominent as compared to the right with question of mild hydronephrosis. This may be related to the ongoing pelvic process. No nephrolithiasis bilaterally. Right kidney within normal limits. No definite hydroureter. Partially distended bladder unremarkable. Stomach/Bowel: Stomach within normal limits. No evidence for bowel obstruction. No acute inflammatory changes seen about the bowels. Vascular/Lymphatic: Limited evaluation of the vascular structures on this noncontrast examination. Question enlarged periaortic lymph nodes measuring up to 15 mm (series 2, image 34). No other definite adenopathy on this noncontrast examination. Reproductive: Possible small fibroid arising from the uterine fundus noted (series 2, image 58). There are bilateral somewhat tubular cystic lesions within both adnexa, measuring 14.2 x 5.7 cm on the right and 12.6 x 6.7 cm on the left. These lesions measure low height and intermediate fluid density (approximately 20-25 Hounsfield units). Ovaries themselves not well delineated. Findings are suspected to reflect hydrosalpinges. Ovarian malignancy felt unlikely given the bilateral symmetric nature of this finding and patient age. Other: No free intraperitoneal air. Small volume free fluid within the abdomen and pelvis. Musculoskeletal: Mild diffuse anasarca noted. No acute osseous abnormality. No worrisome lytic or  blastic osseous lesions. IMPRESSION: 1. Enlarged tubular cystic structures involving the bilateral adnexa as above, suspected to reflect enlarged hydrosalpinges. These structures demonstrate low to intermediate fluid density, raising the possibility that some component of proteinaceous material (as may be expected with infection or possibly blood) may be present. Further evaluation with dedicated pelvic ultrasound may be helpful to further evaluate this finding. 2. Mild prominence of the left kidney with suspected mild left hydronephrosis. Finding may be secondary to the enlarged hydrosalpinges. Superimposed infection could also be considered. Correlation with urinalysis recommended. 3. Hepatosplenomegaly. 4. Small volume free fluid within the abdomen and pelvis with mild diffuse anasarca, suspected to be related to underlying intrinsic  liver disease. 5. Enlarged periaortic adenopathy as above, not well delineated on this noncontrast examination. 6. Decreased density within the cardiac blood pool, suggesting anemia. Electronically Signed   By: Rise MuBenjamin  McClintock M.D.   On: 12/13/2015 20:45   [place] Transvaginal Non-ob  Result Date: 12/14/2015 CLINICAL DATA:  Ultrasound was provided for use by the ordering physician, and a technical charge was applied by the performing facility.  No radiologist interpretation/professional services rendered.   [place] Pelvis Complete  Result Date: 12/14/2015 CLINICAL DATA:  4 weeks postpartum, severe anemia, adnexal masses on CT EXAM: TRANSABDOMINAL AND TRANSVAGINAL ULTRASOUND OF PELVIS TECHNIQUE: Both transabdominal and transvaginal ultrasound examinations of the pelvis were performed. Transabdominal technique was performed for global imaging of the pelvis including uterus, ovaries, adnexal regions, and pelvic cul-de-sac. It was necessary to proceed with endovaginal exam following the transabdominal exam to visualize the bilateral adnexa. COMPARISON:  CT abdomen/pelvis dated  12/13/2015 FINDINGS: Uterus Measurements: 16.4 x 6.3 x 7.8 cm. No fibroids or other mass visualized. Endometrium Thickness: 17 mm.  No focal abnormality visualized. Right ovary Not discretely visualized. 14.5 x 7.3 x 8.6 cm complex cystic/tubular lesion with layering hemorrhage/debris in the right adnexa, favored to reflect hematosalpinx. Left ovary Not discretely visualized. 14.7 x 8.5 x 6.7 cm complex cystic/tubular lesion with layering hemorrhage/debris in the left adnexa, favored to reflect hematosalpinx. Other findings No abnormal free fluid. IMPRESSION: Large bilateral hematosalpinx, as above. Endometrial complex measures 17 mm, within normal limits. No gas on CT or ultrasound to suggest infection/endometritis. Postpartum hematosalpinx can be seen in the setting of uterine scarring related to instrumentation or infection, but it is unclear that either of those scenarios applying to this patient. Additionally, given the clinical picture and CT findings, an underlying coagulopathy (possibly related to hepatic dysfunction) is suspected, although the exact etiology remains unclear on imaging. These results were called by telephone at the time of interpretation on 12/14/2015 at 11:30 am to Dr. Jonette Evaonya Pratt, who verbally acknowledged these results. Electronically Signed   By: Charline BillsSriyesh  Krishnan M.D.   On: 12/14/2015 12:16   [place] Renal  Result Date: 12/15/2015 CLINICAL DATA:  Left hydronephrosis noted on the recent CT. Large bilateral hematosalpinx noted on pelvic ultrasound yesterday. EXAM: RENAL / URINARY TRACT ULTRASOUND COMPLETE COMPARISON:  CT, [birth year]/1929 17.  Pelvic ultrasound, 12/14/2015. FINDINGS: Right Kidney: Length: 13.5 cm. Borderline increased renal parenchymal echogenicity. No mass or stone. Mild dilation of the renal pelvis without caliectasis. Left Kidney: Length: 13.9 cm. Normal parenchymal echogenicity. No mass or stone. Mild hydronephrosis. The Bladder: No bladder mass, stone or wall thickening.  Neither ureteral jet was visualized. Spleen measures 14.2 cm longest dimension. Complex pelvic masses consistent with bilateral hematosalpinx is stable from the previous day's ultrasound. IMPRESSION: 1. Mild left hydronephrosis. Mild dilation of the right renal pelvis. This is likely due to the pelvic masses/hematosalpinxes noted on the current and previous day's ultrasound. No renal masses or stones. Electronically Signed   By: Amie Portlandavid  Ormond M.D.   On: 12/15/2015 13:06   [place] Art/ven Flow Abd Pelv Doppler  Result Date: 12/14/2015 CLINICAL DATA:  Hepatomegaly. EXAM: DUPLEX ULTRASOUND OF LIVER TECHNIQUE: Color and duplex Doppler ultrasound was performed to evaluate the hepatic in-flow and out-flow vessels. COMPARISON:  Same day. FINDINGS: Portal Vein Velocities Main:  42.9 cm/sec Right:  42.2 cm/sec Left:  43.7 cm/sec Hepatopetal flow is noted. Hepatic Vein Velocities Right:  42.2 cm/sec Middle:  54.1 cm/sec Left:  61.8 cm/sec Hepatofugal flow is noted Hepatic Artery Velocity:  155.5 cm/sec Splenic Vein Velocity:  63.6 cm/sec Varices: None. Ascites: Minimal. Moderate splenomegaly is noted with maximum measured length of 18 cm and calculated volume of 1500 cubic cm. IMPRESSION: No evidence of portal, hepatic or splenic thrombosis or occlusion. Moderate splenomegaly. Minimal ascites. Electronically Signed   By: Lupita Raider, M.D.   On: 12/14/2015 12:28   Dg Chest Port 1 View  Result Date: 12/16/2015 CLINICAL DATA:  Shortness of breath when lying down EXAM: PORTABLE CHEST 1 VIEW COMPARISON:  12/15/2015 FINDINGS: Mild pulmonary vascular congestion.  No frank interstitial edema. Small bilateral pleural effusions.  No pneumothorax. Cardiomegaly. IMPRESSION: Cardiomegaly with small bilateral pleural effusions. Mild pulmonary vascular congestion.  No frank interstitial edema. Electronically Signed   By: Charline Bills M.D.   On: 12/16/2015 09:23   Dg Chest Port 1 View  Result Date: 12/15/2015 CLINICAL DATA:   Tachypnea EXAM: PORTABLE CHEST 1 VIEW COMPARISON:  12/13/2015 FINDINGS: The patient has developed abnormal density at both lung bases that could be a combination of volume loss, pneumonia and pleural fluid. Upper lungs remain clear. IMPRESSION: Development of abnormal density at both lung bases which could be a combination of atelectasis, pneumonia and pleural fluid. Electronically Signed   By: Paulina Fusi M.D.   On: 12/15/2015 16:18   Dg Chest Portable 1 View  Result Date: 12/13/2015 CLINICAL DATA:  Shortness of breath for 2 days with central back pain EXAM: PORTABLE CHEST 1 VIEW COMPARISON:  None. FINDINGS: Cardiac shadow is at the upper limits of normal in size. The lungs are well aerated bilaterally. Mild bibasilar atelectatic changes are seen. No focal confluent infiltrate is noted. No bony abnormality is seen. IMPRESSION: Mild bibasilar atelectatic changes. Electronically Signed   By: Alcide Clever M.D.   On: 12/13/2015 18:08   US Abdomen Limited Ruq  Result Date: 12/14/2015 CLINICAL DATA:  Hepatomegaly. EXAM: US ABDOMEN LIMITED - RIGHT UPPER QUADRANT COMPARISON:  CT scan of December 13, 2015. FINDINGS: Gallbladder: No gallstones are noted. Possible sludge is noted within gallbladder lumen. Moderate gallbladder wall thickening is noted measured at 5.8 mm. No pericholecystic fluid or sonographic Murphy's sign is noted. Common bile duct: Diameter: 2.5 mm which is within normal limits. Liver: 1.5 cm simple cyst is seen in right hepatic lobe. Liver appears to be enlarged. Within normal limits in parenchymal echogenicity. IMPRESSION: Probable sludge seen within gallbladder lumen with moderate gallbladder wall thickening. This may be due to adjacent hepatocellular disease. No gallstones are noted. Liver appears to be enlarged. Electronically Signed   By: Lupita Raider, M.D.   On: 12/14/2015 11:52    ASSESSMENT & PLAN:   30 yo AAF about 1 month post-partum after uneventful pregnancy with   1) Acute  Microcytic ANemia h/o iron deficiency anemia. Acute anemia is likely related to bleeding ? Source - partly from post-partum losses. No overt GIB at this time. Concern for large hydrosalpinx b/l LDH and haptoglobin WNL - suggesting against any hemolysis Additional element due to sepsis. Peripheral blood smear - microcytic rbc with increased anisopoikilocytosis, no RBC clumping, no significant schistocytes left shifted myeloid with some neutrophilic vacuolation. Resolved leucocytosis  Normal platelets. PLan -transfuse PRBC prn for Hgb <9 in the setting of ne cardiomyopathy and hypotension. -Iron def masked by reactive increase in ferritin in the setting of acute infection/inflammation. -evaluate and treatment underlying etiology of sepsis. -monitor for any other source of bleeding. -hemoglobinnopathy evaluation 2) ABnormal coags - likely from sepsis with some DIC+dilutional in the setting  of 4 PRBC transfusion.  3) Postpartum/Sepsis related cardiomyopathy  4) AKI likely form sepsis/hypovolemic shock  5) Mild hepatosplenomegaly - likely from sepsis.   All of the patients questions were answered with apparent satisfaction. The patient knows to call the clinic with any problems, questions or concerns.  I spent 60 minutes counseling the patient face to face. The total time spent in the appointment was 80 minutes and more than 50% was on counseling and direct patient cares.    Wyvonnia Lora MD MS AAHIVMS [facility] Hematology/Oncology Physician [facility]  (Office):       [phone] (Work cell):  [phone] (Fax):           [phone]

## 2015-12-16 NOTE — Care Management Note (Signed)
Case Management Note  Patient Details  Name: Carolyn Lynn MRN: [id number] Date of Birth: 08/31/1985  Subjective/Objective:        Pt admitted for shock post partum, c/o abdominal pain, back pain, and fatigue. Urinalysis again consistent with urinary tract infection.             Action/Plan:  PTA from home independent with husband.  Pt had baby a month  ago.  CM will continue to follow for discharge needs   Expected Discharge Date:                  Expected Discharge Plan:  Home/Self Care  In-House Referral:     Discharge planning Services  CM Consult  Post Acute Care Choice:    Choice offered to:     DME Arranged:    DME Agency:     HH Arranged:    HH Agency:     Status of Service:  In process, will continue to follow  If discussed at Long Length of Stay Meetings, dates discussed:    Additional Comments: 12/16/2015  CM requested review by attending to see if pt is appropriate to move to telemetry verses SD - per attending pt still needs SD ; Pt is now on fentanyl PCA and IV lasix. Cherylann ParrClaxton, Neal Oshea S, RN 12/16/2015, 11:38 AM

## 2015-12-16 NOTE — Progress Notes (Signed)
PROGRESS NOTE  Carolyn Lynn ZOX:[id number] DOB: 09-Jul-1985 DOA: 12/13/2015 PCP: No PCP Per Patient  Assessment/Plan: 1. Management per critical care and TRH, anticipate move to stepdown unit 2.   No plan for operative management of bilateral adnexal masses currently, we will follow this patient in hospital for now. Thank you so much for allowing [place] to participate in the care of this patient. We will continue to follow with you. Please call the attending OB/GYN physician with questions or concerns at [phone]  HPI/Subjective:  Patient is a 30 y.o.G1P1001 female who was admitted with hemorrhagic shock. Patient is 4 wks postpartum from an uncomplicated SVD 11/13/2015 following routine PNC. New OB labs WNL except for initial Hgb of 7.8. Also noted to have ovarian cyst on left on U/S at the [facility]. Patient reports minimal bleeding, which stopped 2 wks ago. She has been into the ED and MAU since delivery with pain and fatigue. Found and treated for UTI on 10/22 and grew Klebsiella, treated with negative repeat culture on 10/29. She presented to the ED on 10/31 for SOB, fatigue, feet swelling and abdominal distension. Her hgb noted to be 4.9, AKI, elevated bilirubin, abnormal coags and mildly elevated BNP noted. CT showed bilaterally enlarged tubes and mild hepatomegaly with left hydronephrosis. Patient found to be hypotensive and tachycardic.We are asked to see the patient regarding postpartum state, possible postpartum hemorrhage or possible endometritis. Objective: Vitals:   12/16/15 0900 12/16/15 1000  BP: 100/62 98/60  Pulse:    Resp: (!) 22 (!) 24  Temp:      Intake/Output Summary (Last 24 hours) at 12/16/15 1029 Last data filed at 12/16/15 1000  Gross per 24 hour  Intake              610 ml  Output             3575 ml  Net            -2965 ml   Filed Weights   12/15/15 0444 12/16/15 0405 12/16/15 0922  Weight: 74.4 kg (164 lb 0.4 oz) 72.6 kg (160 lb 0.9 oz) 72.6 kg (160 lb 0.9 oz)     Exam:   General:  NAD, O2 [place] in use  Respiratory: normal effort  Abdomen: not tender  Musculoskeletal: 1-2+ LE edema  Vaginal: deferred   Data Reviewed: Basic Metabolic Panel:  Recent Labs Lab 12/13/15 1722 12/14/15 0045 12/14/15 0711 12/14/15 1450 12/15/15 0011 12/16/15 0211  NA 139 138 139 139  --  135  K 3.3* 4.0 3.8 4.0  --  3.7  CL 112* 111 110 111  --  107  CO2 18* 17* 19* 19*  --  18*  GLUCOSE 104* 93 83 93  --  81  BUN 22* 24* 24* 24*  --  24*  CREATININE 2.22* 2.41* 2.39* 2.32*  --  2.13*  CALCIUM 7.1* 7.4* 7.8* 8.0*  --  8.2*  MG  --   --  1.7  --  1.6* 2.2  PHOS  --   --  6.0*  --  5.7* 5.6*   Liver Function Tests:  Recent Labs Lab 12/13/15 1722 12/14/15 0711 12/15/15 0011  AST 22  --  23  ALT 18  --  17  ALKPHOS 84  --  67  BILITOT 2.0* 3.2* 2.4*  PROT 5.6*  --  5.6*  ALBUMIN 1.4*  --  1.2*    Recent Labs Lab 12/13/15 1722  LIPASE 13   No  results for input(s): AMMONIA in the last 168 hours. CBC:  Recent Labs Lab 12/14/15 1811 12/15/15 0011 12/15/15 1106 12/15/15 1842 12/16/15 0211  WBC 14.2* 11.7* 11.8* 11.2* 10.0  NEUTROABS  --  9.8*  --   --   --   HGB 8.5* 7.2* 7.2* 7.6* 7.8*  HCT 25.8* 21.6* 21.7* 23.0* 23.7*  MCV 76.8* 75.0* 76.4* 75.9* 75.5*  PLT 200 185 195 231 216   Cardiac Enzymes:  Recent Labs Lab 12/14/15 0711 12/14/15 1450  TROPONINI 0.03*  <0.03 <0.03   BNP (last 3 results)  Recent Labs  12/13/15 1723 12/15/15 0900  BNP 350.5* 226.9*    ProBNP (last 3 results) No results for input(s): PROBNP in the last 8760 hours.  CBG:  Recent Labs Lab 12/14/15 0512  GLUCAP 78    Recent Results (from the past 240 hour(s))  Urine culture     Status: None   Collection Time: 12/11/15  8:40 AM  Result Value Ref Range Status   Specimen Description URINE, CLEAN CATCH  Final   Special Requests NONE  Final   [facility]   Final   Report Status 12/12/2015 FINAL   Final  Blood culture (routine x 2)     Status: None (Preliminary result)   Collection Time: 12/13/15  5:22 PM  Result Value Ref Range Status   Specimen Description BLOOD RIGHT ANTECUBITAL  Final   Special Requests BOTTLES DRAWN AEROBIC AND ANAEROBIC 5CC  Final   Culture NO GROWTH 2 DAYS  Final   Report Status PENDING  Incomplete  Blood culture (routine x 2)     Status: None (Preliminary result)   Collection Time: 12/13/15  5:45 PM  Result Value Ref Range Status   Specimen Description BLOOD LEFT ANTECUBITAL  Final   Special Requests IN PEDIATRIC BOTTLE 3CC  Final   Culture NO GROWTH 2 DAYS  Final   Report Status PENDING  Incomplete  Urine culture     Status: Abnormal   Collection Time: 12/13/15  8:31 PM  Result Value Ref Range Status   Specimen Description URINE, CLEAN CATCH  Final   Special Requests NONE  Final   Culture >=100,000 COLONIES/mL KLEBSIELLA PNEUMONIAE (A)  Final   Report Status 12/16/2015 FINAL  Final   Organism ID, Bacteria KLEBSIELLA PNEUMONIAE (A)  Final      Susceptibility   Klebsiella pneumoniae - MIC*    AMPICILLIN >=32 RESISTANT Resistant     CEFAZOLIN <=4 SENSITIVE Sensitive     CEFTRIAXONE <=1 SENSITIVE Sensitive     CIPROFLOXACIN <=0.25 SENSITIVE Sensitive     GENTAMICIN <=1 SENSITIVE Sensitive     IMIPENEM <=0.25 SENSITIVE Sensitive     NITROFURANTOIN 32 SENSITIVE Sensitive     TRIMETH/SULFA <=20 SENSITIVE Sensitive     AMPICILLIN/SULBACTAM 4 SENSITIVE Sensitive     PIP/TAZO <=4 SENSITIVE Sensitive     Extended ESBL NEGATIVE Sensitive     * >=100,000 COLONIES/mL KLEBSIELLA PNEUMONIAE  MRSA PCR Screening     Status: None   Collection Time: 12/14/15  5:11 AM  Result Value Ref Range Status   MRSA by PCR NEGATIVE NEGATIVE Final    Comment:        The GeneXpert MRSA Assay (FDA approved for NASAL specimens only), is one component of a comprehensive MRSA colonization surveillance program. It is not intended to diagnose MRSA infection nor to guide  or monitor treatment for MRSA infections.      Studies: [place]  Transvaginal Non-ob  Result Date: 12/14/2015 CLINICAL DATA:  Ultrasound was provided for use by the ordering physician, and a technical charge was applied by the performing facility.  No radiologist interpretation/professional services rendered.   [place] Pelvis Complete  Result Date: 12/14/2015 CLINICAL DATA:  4 weeks postpartum, severe anemia, adnexal masses on CT EXAM: TRANSABDOMINAL AND TRANSVAGINAL ULTRASOUND OF PELVIS TECHNIQUE: Both transabdominal and transvaginal ultrasound examinations of the pelvis were performed. Transabdominal technique was performed for global imaging of the pelvis including uterus, ovaries, adnexal regions, and pelvic cul-de-sac. It was necessary to proceed with endovaginal exam following the transabdominal exam to visualize the bilateral adnexa. COMPARISON:  CT abdomen/pelvis dated 12/13/2015 FINDINGS: Uterus Measurements: 16.4 x 6.3 x 7.8 cm. No fibroids or other mass visualized. Endometrium Thickness: 17 mm.  No focal abnormality visualized. Right ovary Not discretely visualized. 14.5 x 7.3 x 8.6 cm complex cystic/tubular lesion with layering hemorrhage/debris in the right adnexa, favored to reflect hematosalpinx. Left ovary Not discretely visualized. 14.7 x 8.5 x 6.7 cm complex cystic/tubular lesion with layering hemorrhage/debris in the left adnexa, favored to reflect hematosalpinx. Other findings No abnormal free fluid. IMPRESSION: Large bilateral hematosalpinx, as above. Endometrial complex measures 17 mm, within normal limits. No gas on CT or ultrasound to suggest infection/endometritis. Postpartum hematosalpinx can be seen in the setting of uterine scarring related to instrumentation or infection, but it is unclear that either of those scenarios applying to this patient. Additionally, given the clinical picture and CT findings, an underlying coagulopathy (possibly related to hepatic dysfunction) is suspected,  although the exact etiology remains unclear on imaging. These results were called by telephone at the time of interpretation on 12/14/2015 at 11:30 am to Dr. Jonette Evaonya Pratt, who verbally acknowledged these results. Electronically Signed   By: Charline BillsSriyesh  Krishnan M.D.   On: 12/14/2015 12:16   [place] Renal  Result Date: 12/15/2015 CLINICAL DATA:  Left hydronephrosis noted on the recent CT. Large bilateral hematosalpinx noted on pelvic ultrasound yesterday. EXAM: RENAL / URINARY TRACT ULTRASOUND COMPLETE COMPARISON:  CT, 11/1929 17.  Pelvic ultrasound, 12/14/2015. FINDINGS: Right Kidney: Length: 13.5 cm. Borderline increased renal parenchymal echogenicity. No mass or stone. Mild dilation of the renal pelvis without caliectasis. Left Kidney: Length: 13.9 cm. Normal parenchymal echogenicity. No mass or stone. Mild hydronephrosis. The Bladder: No bladder mass, stone or wall thickening. Neither ureteral jet was visualized. Spleen measures 14.2 cm longest dimension. Complex pelvic masses consistent with bilateral hematosalpinx is stable from the previous day's ultrasound. IMPRESSION: 1. Mild left hydronephrosis. Mild dilation of the right renal pelvis. This is likely due to the pelvic masses/hematosalpinxes noted on the current and previous day's ultrasound. No renal masses or stones. Electronically Signed   By: Amie Portlandavid  Ormond M.D.   On: 12/15/2015 13:06   [place] Art/ven Flow Abd Pelv Doppler  Result Date: 12/14/2015 CLINICAL DATA:  Hepatomegaly. EXAM: DUPLEX ULTRASOUND OF LIVER TECHNIQUE: Color and duplex Doppler ultrasound was performed to evaluate the hepatic in-flow and out-flow vessels. COMPARISON:  Same day. FINDINGS: Portal Vein Velocities Main:  42.9 cm/sec Right:  42.2 cm/sec Left:  43.7 cm/sec Hepatopetal flow is noted. Hepatic Vein Velocities Right:  42.2 cm/sec Middle:  54.1 cm/sec Left:  61.8 cm/sec Hepatofugal flow is noted Hepatic Artery Velocity:  155.5 cm/sec Splenic Vein Velocity:  63.6 cm/sec Varices: None.  Ascites: Minimal. Moderate splenomegaly is noted with maximum measured length of 18 cm and calculated volume of 1500 cubic cm. IMPRESSION: No evidence of portal, hepatic or splenic thrombosis or occlusion. Moderate  splenomegaly. Minimal ascites. Electronically Signed   By: Lupita Raider, M.D.   On: 12/14/2015 12:28   Dg Chest Port 1 View  Result Date: 12/16/2015 CLINICAL DATA:  Shortness of breath when lying down EXAM: PORTABLE CHEST 1 VIEW COMPARISON:  12/15/2015 FINDINGS: Mild pulmonary vascular congestion.  No frank interstitial edema. Small bilateral pleural effusions.  No pneumothorax. Cardiomegaly. IMPRESSION: Cardiomegaly with small bilateral pleural effusions. Mild pulmonary vascular congestion.  No frank interstitial edema. Electronically Signed   By: Charline Bills M.D.   On: 12/16/2015 09:23   Dg Chest Port 1 View  Result Date: 12/15/2015 CLINICAL DATA:  Tachypnea EXAM: PORTABLE CHEST 1 VIEW COMPARISON:  12/13/2015 FINDINGS: The patient has developed abnormal density at both lung bases that could be a combination of volume loss, pneumonia and pleural fluid. Upper lungs remain clear. IMPRESSION: Development of abnormal density at both lung bases which could be a combination of atelectasis, pneumonia and pleural fluid. Electronically Signed   By: Paulina Fusi M.D.   On: 12/15/2015 16:18   US Abdomen Limited Ruq  Result Date: 12/14/2015 CLINICAL DATA:  Hepatomegaly. EXAM: US ABDOMEN LIMITED - RIGHT UPPER QUADRANT COMPARISON:  CT scan of December 13, 2015. FINDINGS: Gallbladder: No gallstones are noted. Possible sludge is noted within gallbladder lumen. Moderate gallbladder wall thickening is noted measured at 5.8 mm. No pericholecystic fluid or sonographic Murphy's sign is noted. Common bile duct: Diameter: 2.5 mm which is within normal limits. Liver: 1.5 cm simple cyst is seen in right hepatic lobe. Liver appears to be enlarged. Within normal limits in parenchymal echogenicity. IMPRESSION:  Probable sludge seen within gallbladder lumen with moderate gallbladder wall thickening. This may be due to adjacent hepatocellular disease. No gallstones are noted. Liver appears to be enlarged. Electronically Signed   By: Lupita Raider, M.D.   On: 12/14/2015 11:52    Scheduled Meds: . cefTRIAXone (ROCEPHIN)  IV  1 g Intravenous Q24H  . famotidine (PEPCID) IV  20 mg Intravenous Q12H  . fentaNYL   Intravenous Q4H  . furosemide  40 mg Intravenous TID   Continuous Infusions: . sodium chloride 10 mL/hr at 12/16/15 1004    Principal Problem:   Shock (HCC) Active Problems:   AKI (acute kidney injury) (HCC)   Jaundice   Hepatosplenomegaly   Anemia, iron deficiency   Adnexal mass   Abnormal coagulation profile   Anemia   Sepsis (HCC)   Hydronephrosis, left  25 min face to face and coordination of care      [facility]  Faculty Practice Attending Physician [facility] Phone: [phone] 12/16/2015, 10:29 AM  LOS: 2 days

## 2015-12-16 NOTE — Progress Notes (Signed)
[facility] PROGRESS NOTE Subjective patient about the same. Her main complaint is epigastric discomfort. She is currently on famotidine IV once daily. Ultrasounds do not show any obvious portal vein thrombosis.  Objective: Vital signs in last 24 hours: Temp:  [97.7 F (36.5 C)-99.6 F (37.6 C)] 97.7 F (36.5 C) (11/03 0734) Pulse Rate:  [96-108] 105 (11/02 1300) Resp:  [19-40] 25 (11/03 0800) BP: (86-111)/(56-79) 96/57 (11/03 0800) SpO2:  [97 %-100 %] 98 % (11/03 0800) Weight:  [72.6 kg (160 lb 0.9 oz)] 72.6 kg (160 lb 0.9 oz) (11/03 0405) Last BM Date: 12/15/15  Intake/Output from previous day: 11/02 0701 - 11/03 0700 In: 1030 [I.V.:580; IV Piggyback:450] Out: 2975 [Urine:2975] Intake/Output this shift: No intake/output data recorded.  PE: General-- somewhat groggy and no distress  Abdomen-- somewhat distended with mild epigastric tenderness  Lab Results:  Recent Labs  12/14/15 1811 12/15/15 0011 12/15/15 1106 12/15/15 1842 12/16/15 0211  WBC 14.2* 11.7* 11.8* 11.2* 10.0  HGB 8.5* 7.2* 7.2* 7.6* 7.8*  HCT 25.8* 21.6* 21.7* 23.0* 23.7*  PLT 200 185 195 231 216   BMET  Recent Labs  12/13/15 1722 12/14/15 0045 12/14/15 0711 12/14/15 1450 12/16/15 0211  NA 139 138 139 139 135  K 3.3* 4.0 3.8 4.0 3.7  CL 112* 111 110 111 107  CO2 18* 17* 19* 19* 18*  CREATININE 2.22* 2.41* 2.39* 2.32* 2.13*   LFT  Recent Labs  12/13/15 1722 12/14/15 0711 12/15/15 0011  PROT 5.6*  --  5.6*  AST 22  --  23  ALT 18  --  17  ALKPHOS 84  --  67  BILITOT 2.0* 3.2* 2.4*  BILIDIR  --  2.1* 1.4*  IBILI  --  1.1* 1.0*   PT/INR  Recent Labs  12/13/15 1745 12/14/15 0711  LABPROT 18.2* 17.7*  INR 1.49 1.44   PANCREAS  Recent Labs  12/13/15 1722  LIPASE 13         Studies/Results: [place] Transvaginal Non-ob  Result Date: 12/14/2015 CLINICAL DATA:  Ultrasound was provided for use by the ordering physician, and a technical charge was applied by the  performing facility.  No radiologist interpretation/professional services rendered.   [place] Pelvis Complete  Result Date: 12/14/2015 CLINICAL DATA:  4 weeks postpartum, severe anemia, adnexal masses on CT EXAM: TRANSABDOMINAL AND TRANSVAGINAL ULTRASOUND OF PELVIS TECHNIQUE: Both transabdominal and transvaginal ultrasound examinations of the pelvis were performed. Transabdominal technique was performed for global imaging of the pelvis including uterus, ovaries, adnexal regions, and pelvic cul-de-sac. It was necessary to proceed with endovaginal exam following the transabdominal exam to visualize the bilateral adnexa. COMPARISON:  CT abdomen/pelvis dated 12/13/2015 FINDINGS: Uterus Measurements: 16.4 x 6.3 x 7.8 cm. No fibroids or other mass visualized. Endometrium Thickness: 17 mm.  No focal abnormality visualized. Right ovary Not discretely visualized. 14.5 x 7.3 x 8.6 cm complex cystic/tubular lesion with layering hemorrhage/debris in the right adnexa, favored to reflect hematosalpinx. Left ovary Not discretely visualized. 14.7 x 8.5 x 6.7 cm complex cystic/tubular lesion with layering hemorrhage/debris in the left adnexa, favored to reflect hematosalpinx. Other findings No abnormal free fluid. IMPRESSION: Large bilateral hematosalpinx, as above. Endometrial complex measures 17 mm, within normal limits. No gas on CT or ultrasound to suggest infection/endometritis. Postpartum hematosalpinx can be seen in the setting of uterine scarring related to instrumentation or infection, but it is unclear that either of those scenarios applying to this patient. Additionally, given the clinical picture and CT findings, an underlying coagulopathy (  possibly related to hepatic dysfunction) is suspected, although the exact etiology remains unclear on imaging. These results were called by telephone at the time of interpretation on 12/14/2015 at 11:30 am to Dr. Jonette Eva, who verbally acknowledged these results. Electronically  Signed   By: Charline Bills M.D.   On: 12/14/2015 12:16   US Renal  Result Date: 12/15/2015 CLINICAL DATA:  Left hydronephrosis noted on the recent CT. Large bilateral hematosalpinx noted on pelvic ultrasound yesterday. EXAM: RENAL / URINARY TRACT ULTRASOUND COMPLETE COMPARISON:  CT, 11/1929 17.  Pelvic ultrasound, 12/14/2015. FINDINGS: Right Kidney: Length: 13.5 cm. Borderline increased renal parenchymal echogenicity. No mass or stone. Mild dilation of the renal pelvis without caliectasis. Left Kidney: Length: 13.9 cm. Normal parenchymal echogenicity. No mass or stone. Mild hydronephrosis. The Bladder: No bladder mass, stone or wall thickening. Neither ureteral jet was visualized. Spleen measures 14.2 cm longest dimension. Complex pelvic masses consistent with bilateral hematosalpinx is stable from the previous day's ultrasound. IMPRESSION: 1. Mild left hydronephrosis. Mild dilation of the right renal pelvis. This is likely due to the pelvic masses/hematosalpinxes noted on the current and previous day's ultrasound. No renal masses or stones. Electronically Signed   By: Amie Portland M.D.   On: 12/15/2015 13:06   [place] Art/ven Flow Abd Pelv Doppler  Result Date: 12/14/2015 CLINICAL DATA:  Hepatomegaly. EXAM: DUPLEX ULTRASOUND OF LIVER TECHNIQUE: Color and duplex Doppler ultrasound was performed to evaluate the hepatic in-flow and out-flow vessels. COMPARISON:  Same day. FINDINGS: Portal Vein Velocities Main:  42.9 cm/sec Right:  42.2 cm/sec Left:  43.7 cm/sec Hepatopetal flow is noted. Hepatic Vein Velocities Right:  42.2 cm/sec Middle:  54.1 cm/sec Left:  61.8 cm/sec Hepatofugal flow is noted Hepatic Artery Velocity:  155.5 cm/sec Splenic Vein Velocity:  63.6 cm/sec Varices: None. Ascites: Minimal. Moderate splenomegaly is noted with maximum measured length of 18 cm and calculated volume of 1500 cubic cm. IMPRESSION: No evidence of portal, hepatic or splenic thrombosis or occlusion. Moderate splenomegaly.  Minimal ascites. Electronically Signed   By: Lupita Raider, M.D.   On: 12/14/2015 12:28   Dg Chest Port 1 View  Result Date: 12/15/2015 CLINICAL DATA:  Tachypnea EXAM: PORTABLE CHEST 1 VIEW COMPARISON:  12/13/2015 FINDINGS: The patient has developed abnormal density at both lung bases that could be a combination of volume loss, pneumonia and pleural fluid. Upper lungs remain clear. IMPRESSION: Development of abnormal density at both lung bases which could be a combination of atelectasis, pneumonia and pleural fluid. Electronically Signed   By: Paulina Fusi M.D.   On: 12/15/2015 16:18   US Abdomen Limited Ruq  Result Date: 12/14/2015 CLINICAL DATA:  Hepatomegaly. EXAM: US ABDOMEN LIMITED - RIGHT UPPER QUADRANT COMPARISON:  CT scan of December 13, 2015. FINDINGS: Gallbladder: No gallstones are noted. Possible sludge is noted within gallbladder lumen. Moderate gallbladder wall thickening is noted measured at 5.8 mm. No pericholecystic fluid or sonographic Murphy's sign is noted. Common bile duct: Diameter: 2.5 mm which is within normal limits. Liver: 1.5 cm simple cyst is seen in right hepatic lobe. Liver appears to be enlarged. Within normal limits in parenchymal echogenicity. IMPRESSION: Probable sludge seen within gallbladder lumen with moderate gallbladder wall thickening. This may be due to adjacent hepatocellular disease. No gallstones are noted. Liver appears to be enlarged. Electronically Signed   By: Lupita Raider, M.D.   On: 12/14/2015 11:52    Medications: I have reviewed the patient's current medications.  Assessment/Plan: 1. Epigastric pain. This  could be due to reflux she is really receiving a sub optimal dose of an H2 blocker and we will increase this. 2. Abnormal liver. Hepatosplenomegaly may well be due to fluid overload in heart failure. Her liver function appears to be good in their no signs of portal vein thrombosis. 3. UTI. Patient continues to grow Klebsiella out of her urine  despite a prior course of oral antibiotic that was reported to be appropriate for the sensitivity of the Klebsiella but obviously did not work. She's been on antibiotics here and hopefully they are working sensor white blood count is coming down. It may be that all of her other problems are secondary to sepsis from this. We may need to get ID on board if she does not improve.   Onetta Spainhower JR,Lyle Leisner L 12/16/2015, 9:24 AM  This note was created using voice recognition software. Minor errors may Have occurred unintentionally.  Pager: [phone] If no answer or after hours call [phone]

## 2015-12-16 NOTE — Consult Note (Signed)
Patient ID: Carolyn Lynn MRN: [id number] DOB/AGE: 29/08/1985 29 y.o.  Admit date: 12/13/2015 Primary Physician No PCP Primary Cardiologist N/a   Chief Complaint  Shortness of breath  HPI: Ms. Carolyn Lynn is a [id number] 30 year old female without significant PMH who is 1 month post-partum after an uneventful pregnancy and delivery. She was seen in the ED on 10/22 and found to have a Klebsiella pneumoniae UTI treated with Keflex with negative repeat culture on 10/29. She presented to [facility] ED on 10/31 for dyspnea, swelling in her feet, legs, and abdomen, and malaise. She was noted to be hypotensive, tachycardic with a Hgb of 4.9.   Additional labwork also revealed an AKI, abnormal coags, elevated bilirubin, elevated procalcitonin, BNP of 350, and negative troponin trend of <0.03, 0.03, <0.03. She was admitted to the medical ICU due to concern for hemorrhagic versus septic shock. Imaging and U/S studies revealed bilateral hematosalpinxes, left hydronephrosis, hepatosplenomegaly. She received 2 L NS fluid boluses and at least 4 units PRBCs. Urine studies showed recurrent Klebsiella pneumoniae and antibiotics were narrowed from Vanc/Zosyn to Ceftriaxone.  Patient developed worsened dyspnea and was thought to be volume overloaded. CXR showed worsened vascular congestion compared to admission. TTE revealed an EF of 40-45% with mild diffuse hypokinesis. Patient states prior to her pregnancy, she has not had issues with swelling in her feet or legs. Her symptoms gradually began about 1 week after delivery. She denies any history of heart problems in herself or immediate family. No history of blood clots. She denies any history of tobacco, alcohol, or illicit drug use.  Review of Systems:   Review of Systems  Constitutional: Positive for malaise/fatigue. Negative for chills, diaphoresis and fever.  Respiratory: Negative for cough, hemoptysis, sputum production, shortness of breath and wheezing.     Cardiovascular: Positive for orthopnea and leg swelling. Negative for chest pain, palpitations, claudication and PND.  Gastrointestinal: Positive for abdominal pain. Negative for blood in stool, melena, nausea and vomiting.  Genitourinary: Negative for hematuria.  Musculoskeletal: Negative for myalgias.  Neurological: Positive for weakness. Negative for dizziness, sensory change, focal weakness and loss of consciousness.  Psychiatric/Behavioral: Negative for substance abuse.     Past Medical History:  Diagnosis Date  . Medical history non-contributory     Medications Prior to Admission  Medication Sig Dispense Refill  . docusate sodium (COLACE) 100 MG capsule Take 1 capsule (100 mg total) by mouth every 12 (twelve) hours. (Patient taking differently: Take 100 mg by mouth 2 (two) times daily as needed for mild constipation. ) 60 capsule 0  . ferrous sulfate 325 (65 FE) MG tablet Take 325 mg by mouth 3 (three) times daily.  5  . ibuprofen (ADVIL,MOTRIN) 200 MG tablet Take 400-600 mg by mouth every 6 (six) hours as needed (for pain).     . Prenatal Vit-Fe Fumarate-FA (PRENATAL MULTIVITAMIN) TABS tablet Take 1 tablet by mouth daily.        . cefTRIAXone (ROCEPHIN)  IV  1 g Intravenous Q24H  . famotidine (PEPCID) IV  20 mg Intravenous Q12H  . fentaNYL   Intravenous Q4H  . furosemide  40 mg Intravenous TID    Infusions: . sodium chloride 10 mL/hr at 12/16/15 1004    No Known Allergies  Social History   Social History  . Marital status: Single    Spouse name: N/A  . Number of children: N/A  . Years of education: N/A   Occupational History  . Not on file.  Social History Main Topics  . Smoking status: Never Smoker  . Smokeless tobacco: Never Used  . Alcohol use No  . Drug use: No  . Sexual activity: Yes    Birth control/ protection: None   Other Topics Concern  . Not on file   Social History Narrative  . No narrative on file    History reviewed. No pertinent  family history.  PHYSICAL EXAM: Vitals:   12/16/15 1112 12/16/15 1200  BP:  101/66  Pulse:    Resp:  19  Temp: 98.5 F (36.9 C)      Intake/Output Summary (Last 24 hours) at 12/16/15 1319 Last data filed at 12/16/15 1213  Gross per 24 hour  Intake              500 ml  Output             3125 ml  Net            -2625 ml    General:  Tired, ill-appearing. No respiratory difficulty, on supplemental O2 via [place] HEENT: Conjuctival pallor Neck: supple. no JVD. Carotids 2+ bilat; no bruits. No lymphadenopathy or thryomegaly appreciated. Cor: PMI nondisplaced. Regular rate & rhythm. No rubs, gallops or murmurs. Lungs: clear with reduced air movement Abdomen: slightly distended, tender to light palpation, Hypoactive bowel sounds. Extremities: Swollen feet with trace pitting edema bilateral lower extremities half-way up the shins Neuro: alert & oriented x 3, lethargic, follows commands  Results for orders placed or performed during the hospital encounter of 12/13/15 (from the past 24 hour(s))  CBC     Status: Abnormal   Collection Time: 12/15/15  6:42 PM  Result Value Ref Range   WBC 11.2 (H) 4.0 - 10.5 K/uL   RBC 3.03 (L) 3.87 - 5.11 MIL/uL   Hemoglobin 7.6 (L) 12.0 - 15.0 g/dL   HCT 62.123.0 (L) 30.836.0 - 65.746.0 %   MCV 75.9 (L) 78.0 - 100.0 fL   MCH 25.1 (L) 26.0 - 34.0 pg   MCHC 33.0 30.0 - 36.0 g/dL   RDW 84.617.8 (H) 96.211.5 - 95.215.5 %   Platelets 231 150 - 400 K/uL  Procalcitonin     Status: None   Collection Time: 12/16/15  2:11 AM  Result Value Ref Range   Procalcitonin 3.81 ng/mL  Magnesium     Status: None   Collection Time: 12/16/15  2:11 AM  Result Value Ref Range   Magnesium 2.2 1.7 - 2.4 mg/dL  Phosphorus     Status: Abnormal   Collection Time: 12/16/15  2:11 AM  Result Value Ref Range   Phosphorus 5.6 (H) 2.5 - 4.6 mg/dL  CBC     Status: Abnormal   Collection Time: 12/16/15  2:11 AM  Result Value Ref Range   WBC 10.0 4.0 - 10.5 K/uL   RBC 3.14 (L) 3.87 - 5.11 MIL/uL    Hemoglobin 7.8 (L) 12.0 - 15.0 g/dL   HCT 84.123.7 (L) 32.436.0 - 40.146.0 %   MCV 75.5 (L) 78.0 - 100.0 fL   MCH 24.8 (L) 26.0 - 34.0 pg   MCHC 32.9 30.0 - 36.0 g/dL   RDW 02.718.3 (H) 25.311.5 - 66.415.5 %   Platelets 216 150 - 400 K/uL  Basic metabolic panel     Status: Abnormal   Collection Time: 12/16/15  2:11 AM  Result Value Ref Range   Sodium 135 135 - 145 mmol/L   Potassium 3.7 3.5 - 5.1 mmol/L   Chloride 107 101 -  111 mmol/L   CO2 18 (L) 22 - 32 mmol/L   Glucose, Bld 81 65 - 99 mg/dL   BUN 24 (H) 6 - 20 mg/dL   Creatinine, Ser 1.61 (H) 0.44 - 1.00 mg/dL   Calcium 8.2 (L) 8.9 - 10.3 mg/dL   GFR calc non Af Amer 30 (L) >60 mL/min   GFR calc Af Amer 35 (L) >60 mL/min   Anion gap 10 5 - 15  Ferritin     Status: Abnormal   Collection Time: 12/16/15  9:58 AM  Result Value Ref Range   Ferritin 443 (H) 11 - 307 ng/mL  Iron and TIBC     Status: Abnormal   Collection Time: 12/16/15  9:58 AM  Result Value Ref Range   Iron 22 (L) 28 - 170 ug/dL   TIBC 096 (L) 045 - 409 ug/dL   Saturation Ratios 17 10.4 - 31.8 %   UIBC 108 ug/dL  Vitamin W11     Status: Abnormal   Collection Time: 12/16/15  9:58 AM  Result Value Ref Range   Vitamin B-12 3,852 (H) 180 - 914 pg/mL  APTT     Status: Abnormal   Collection Time: 12/16/15  9:58 AM  Result Value Ref Range   aPTT 51 (H) 24 - 36 seconds  Protime-INR     Status: Abnormal   Collection Time: 12/16/15  9:58 AM  Result Value Ref Range   Prothrombin Time 17.8 (H) 11.4 - 15.2 seconds   INR 1.45   Sedimentation rate     Status: Abnormal   Collection Time: 12/16/15  9:58 AM  Result Value Ref Range   Sed Rate 104 (H) 0 - 22 mm/hr   US Renal  Result Date: 12/15/2015 CLINICAL DATA:  Left hydronephrosis noted on the recent CT. Large bilateral hematosalpinx noted on pelvic ultrasound yesterday. EXAM: RENAL / URINARY TRACT ULTRASOUND COMPLETE COMPARISON:  CT, 11/1929 17.  Pelvic ultrasound, 12/14/2015. FINDINGS: Right Kidney: Length: 13.5 cm. Borderline increased  renal parenchymal echogenicity. No mass or stone. Mild dilation of the renal pelvis without caliectasis. Left Kidney: Length: 13.9 cm. Normal parenchymal echogenicity. No mass or stone. Mild hydronephrosis. The Bladder: No bladder mass, stone or wall thickening. Neither ureteral jet was visualized. Spleen measures 14.2 cm longest dimension. Complex pelvic masses consistent with bilateral hematosalpinx is stable from the previous day's ultrasound. IMPRESSION: 1. Mild left hydronephrosis. Mild dilation of the right renal pelvis. This is likely due to the pelvic masses/hematosalpinxes noted on the current and previous day's ultrasound. No renal masses or stones. Electronically Signed   By: Amie Portland M.D.   On: 12/15/2015 13:06   Dg Chest Port 1 View  Result Date: 12/16/2015 CLINICAL DATA:  Shortness of breath when lying down EXAM: PORTABLE CHEST 1 VIEW COMPARISON:  12/15/2015 FINDINGS: Mild pulmonary vascular congestion.  No frank interstitial edema. Small bilateral pleural effusions.  No pneumothorax. Cardiomegaly. IMPRESSION: Cardiomegaly with small bilateral pleural effusions. Mild pulmonary vascular congestion.  No frank interstitial edema. Electronically Signed   By: Charline Bills M.D.   On: 12/16/2015 09:23   Dg Chest Port 1 View  Result Date: 12/15/2015 CLINICAL DATA:  Tachypnea EXAM: PORTABLE CHEST 1 VIEW COMPARISON:  12/13/2015 FINDINGS: The patient has developed abnormal density at both lung bases that could be a combination of volume loss, pneumonia and pleural fluid. Upper lungs remain clear. IMPRESSION: Development of abnormal density at both lung bases which could be a combination of atelectasis, pneumonia and pleural fluid. Electronically  Signed   By: Paulina Fusi M.D.   On: 12/15/2015 16:18      ECG: Sinus tachycardia, non-specific t-wave changes in III, V3 CXR: Increased pulmonary vascular congestion, pulmonary edema when compared to admission CXR  ASSESSMENT AND PLAN:  [id number]  30 year old female without significant PMH who is 1 month post-partum after an uneventful pregnancy and delivery admitted for septic vs hemorrhagic shock.  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 EF is 40-45% with dilated LV. Likely exacerbated by systemic illness although dilated LV may indicate a more chronic pattern. She continues to be hypotensive which limits addition of usual medical management of heart failure (ACE-I or ARB, Beta Blocker, Spironolactone). Would avoid ACE-I and Spironolactone if she is breastfeeding. Agree with IV lasix diuresis which she appears to be responding well with UOP and improvement in renal function. -Continue IV Lasix 40 mg TID -Monitor BMET for potassium and renal function -Continue daily weights and strict I/Os -Add on heart failure medications when BP tolerates -Will need repeat Echo in 3-6 months to evaluate for recovery in EF  Septic shock: Secondary to Klebsiella UTI, on Ceftriaxone. Continues to be hypotensive. -Hold off on HF medications while hypotensive -Caution IVF resuscitation in setting of HF -Ceftriaxone per primary  Anemia: Thought secondary to hemorrhage which has now stabilized s/p PRBC transfusions. Hematology consulted. Possible DIC in setting of sepsis. -Monitor CBC, transfuse as needed  AKI: SCr improving with Lasix     Signed: Darreld Mclean, MD [facility] PGY-2 12/16/2015, 1:19 PM

## 2015-12-16 NOTE — Progress Notes (Signed)
PROGRESS NOTE    Carolyn Lynn  KGM:[id number] DOB: March 02, 1985 DOA: 12/13/2015 PCP: No PCP Per Patient   Brief Narrative:  30 year old BF PMHx none. Postpartum from 11/13/2015   Presenting today with back pain, shortness of breath, fatigue and mild feet swelling and abdominal distention. Patient was seen on the 22nd of this month and at that time was diagnosed with a UTI and constipation. She states since that time symptoms are improved with the shortness of breath has worsened over last 2-3 days. On exam patient is hypotensive and tachycardic. Mental status is within normal limits. She has some mild upper abdominal tenderness but no CVA or suprapubic tenderness. She denies any urinary symptoms at this time. She states her delivery was uncomplicated and her vaginal bleeding has almost completely resolved. Hemoglobin before delivery was 12 but she did not have a repeat done. Patient did not have a hemoglobin check at her emergency visit on the 22nd but had normal renal function at that time. Bedside ultrasound without significant signs of heart strain or cardiomyopathy. Patient's hypotension would suggest that she does not have preeclampsia. Concern for potential PE, anemia, less likely dissection as patient is hypotensive pressures are the same in both arms, abdominal process. Patient's blood pressures are consistently in the 70s and 80s with maps in the 60s. Patient's blood work shows a significant anemia of 4.9. CMP with new acute kidney injury with her creatinine of 2.2. LFTs within normal limits. BMP mildly elevated at 350. Patient typed and screened and blood ordered. Also will do a CT of the abdomen to rule out hydronephrosis or internal bleeding.   Subjective: 11/3  A/O 4, positive of continued back pain. States using PCA~q 20 minutes. Negative CP, negative SOB.   Assessment & Plan:   Principal Problem:   Shock (HCC) Active Problems:   AKI (acute kidney injury) (HCC)    Jaundice   Hepatosplenomegaly   Anemia, iron deficiency   Adnexal mass   Abnormal coagulation profile   Anemia   Sepsis (HCC)   Acute systolic CHF -Strict I&O since admission +3.5 L -Daily weight Filed Weights   12/15/15 0444 12/16/15 0405 12/16/15 0922  Weight: 74.4 kg (164 lb 0.4 oz) 72.6 kg (160 lb 0.9 oz) 72.6 kg (160 lb 0.9 oz)  -transfuse for hemoglobin<8  Hypotension/Shock  -Multifactorial to include hypovolemia, hemorrhagic, adrenal insufficiency? -A.m. cortisol normal.  Lt sided Hydronephrosis/Acute Renal failure  Lab Results  Component Value Date   CREATININE 2.13 (H) 12/16/2015   CREATININE 2.32 (H) 12/14/2015   CREATININE 2.39 (H) 12/14/2015  -Monitor closely. Most likely secondary to UTI, hypotensive shock -Cr trending down: Normal saline at [facility] secondary to pulmonary edema  UTI positive Klebsiella pneumoniae -treat as complicated infection. Treat for 7-14 days  Hepatosplenomegaly   Acute anemia -10/31 transfuse 2 units PRBC -11/1 transfuse 2 units PRBC -No obvious hemorrhagic source. -Anemia panel pending.      DVT prophylaxis: SCD  Code Status: Full Family Communication: None Disposition Plan:?   Consultants:  [facility]    Procedures/Significant Events:  10/31 CT abdomen pelvis WO contrast:-enlarged hydrosalpinges (.infection vs blood) next line -Left kidney;suspected hydronephrosis. - Hepatosplenomegaly. - Small volume free fluid within the abdomen and pelvis with mild diffuse anasarca, suspected  Intrinsic liver disease. - Enlarged periaortic adenopathy  10/31 transfuse 2 units PRBC 11/1 transfuse 2 units PRBC    Cultures 10/31 blood right/left AC NGTD 10/31 urine positive Klebsiella pneumoniae 11/1 MRSA by PCR negative   Antimicrobials: Keflex  10/22 via ER >> 5 days (12/09/15 Zosyn 10/31 > Vancomycin 10/31 >   Devices    LINES / TUBES:      Continuous Infusions: . sodium chloride 10 mL/hr at 12/15/15 1100      Objective: Vitals:   12/16/15 0700 12/16/15 0725 12/16/15 0734 12/16/15 0800  BP: 104/66   (!) 96/57  Pulse:      Resp: (!) 26 (!) 22  (!) 25  Temp:   97.7 F (36.5 C)   TempSrc:   Oral   SpO2:  98%  98%  Weight:      Height:        Intake/Output Summary (Last 24 hours) at 12/16/15 0910 Last data filed at 12/16/15 4098  Gross per 24 hour  Intake              630 ml  Output             2675 ml  Net            -2045 ml   Filed Weights   12/14/15 0500 12/15/15 0444 12/16/15 0405  Weight: 70.5 kg (155 lb 6.8 oz) 74.4 kg (164 lb 0.4 oz) 72.6 kg (160 lb 0.9 oz)    Examination:  General: A/O 4, back pain, No acute respiratory distress Eyes: negative scleral hemorrhage, negative anisocoria, negative icterus ENT: Negative Runny nose, negative gingival bleeding, Neck:  Negative scars, masses, torticollis, lymphadenopathy, JVD Lungs: Clear to auscultation bilaterally, except diminished breath sounds but basilar, without wheezes or crackles Cardiovascular: Regular rate and rhythm without murmur gallop or rub normal S1 and S2 Abdomen: Mild positive abdominal pain, positive distention, positive soft, bowel sounds, no rebound, no ascites, no appreciable mass, positive bilateral CVA tenderness Extremities: No significant cyanosis, clubbing, positive bilateral lower extremity edema 2+ to midthigh Skin: Negative rashes, lesions, ulcers Psychiatric:  Negative depression, negative anxiety, negative fatigue, negative mania  Central nervous system:  Cranial nerves II through XII intact, tongue/uvula midline, all extremities muscle strength 5/5, sensation intact throughout, negative dysarthria, negative expressive aphasia, negative receptive aphasia.  .     Data Reviewed: Care during the described time interval was provided by me .  I have reviewed this patient's available data, including medical history, events of note, physical examination, and all test results as part of my  evaluation. I have personally reviewed and interpreted all radiology studies.  CBC:  Recent Labs Lab 12/14/15 1811 12/15/15 0011 12/15/15 1106 12/15/15 1842 12/16/15 0211  WBC 14.2* 11.7* 11.8* 11.2* 10.0  NEUTROABS  --  9.8*  --   --   --   HGB 8.5* 7.2* 7.2* 7.6* 7.8*  HCT 25.8* 21.6* 21.7* 23.0* 23.7*  MCV 76.8* 75.0* 76.4* 75.9* 75.5*  PLT 200 185 195 231 216   Basic Metabolic Panel:  Recent Labs Lab 12/13/15 1722 12/14/15 0045 12/14/15 0711 12/14/15 1450 12/15/15 0011 12/16/15 0211  NA 139 138 139 139  --  135  K 3.3* 4.0 3.8 4.0  --  3.7  CL 112* 111 110 111  --  107  CO2 18* 17* 19* 19*  --  18*  GLUCOSE 104* 93 83 93  --  81  BUN 22* 24* 24* 24*  --  24*  CREATININE 2.22* 2.41* 2.39* 2.32*  --  2.13*  CALCIUM 7.1* 7.4* 7.8* 8.0*  --  8.2*  MG  --   --  1.7  --  1.6* 2.2  PHOS  --   --  6.0*  --  5.7* 5.6*   GFR: Estimated Creatinine Clearance: 38.1 mL/min (by C-G formula based on SCr of 2.13 mg/dL (H)). Liver Function Tests:  Recent Labs Lab 12/13/15 1722 12/14/15 0711 12/15/15 0011  AST 22  --  23  ALT 18  --  17  ALKPHOS 84  --  67  BILITOT 2.0* 3.2* 2.4*  PROT 5.6*  --  5.6*  ALBUMIN 1.4*  --  1.2*    Recent Labs Lab 12/13/15 1722  LIPASE 13   No results for input(s): AMMONIA in the last 168 hours. Coagulation Profile:  Recent Labs Lab 12/13/15 1745 12/14/15 0711  INR 1.49 1.44   Cardiac Enzymes:  Recent Labs Lab 12/14/15 0711 12/14/15 1450  TROPONINI 0.03*  <0.03 <0.03   BNP (last 3 results) No results for input(s): PROBNP in the last 8760 hours. HbA1C: No results for input(s): HGBA1C in the last 72 hours. CBG:  Recent Labs Lab 12/14/15 0512  GLUCAP 78   Lipid Profile: No results for input(s): CHOL, HDL, LDLCALC, TRIG, CHOLHDL, LDLDIRECT in the last 72 hours. Thyroid Function Tests:  Recent Labs  12/14/15 1450  TSH 3.534   Anemia Panel:  Recent Labs  12/13/15 1745 12/14/15 0711  TIBC 139*  --   IRON  6*  --   RETICCTPCT  --  2.7   Urine analysis:    Component Value Date/Time   COLORURINE AMBER (A) 12/13/2015 2031   APPEARANCEUR TURBID (A) 12/13/2015 2031   LABSPEC 1.019 12/13/2015 2031   PHURINE 6.0 12/13/2015 2031   GLUCOSEU NEGATIVE 12/13/2015 2031   HGBUR LARGE (A) 12/13/2015 2031   BILIRUBINUR SMALL (A) 12/13/2015 2031   KETONESUR NEGATIVE 12/13/2015 2031   PROTEINUR 100 (A) 12/13/2015 2031   UROBILINOGEN 1.0 03/30/2015 1049   NITRITE POSITIVE (A) 12/13/2015 2031   LEUKOCYTESUR LARGE (A) 12/13/2015 2031   Sepsis Labs: @LABRCNTIP (procalcitonin:4,lacticidven:4)  ) Recent Results (from the past 240 hour(s))  Urine culture     Status: None   Collection Time: 12/11/15  8:40 AM  Result Value Ref Range Status   Specimen Description URINE, CLEAN CATCH  Final   Special Requests NONE  Final   Culture NO GROWTH Performed at [facility]   Final   Report Status 12/12/2015 FINAL  Final  Blood culture (routine x 2)     Status: None (Preliminary result)   Collection Time: 12/13/15  5:22 PM  Result Value Ref Range Status   Specimen Description BLOOD RIGHT ANTECUBITAL  Final   Special Requests BOTTLES DRAWN AEROBIC AND ANAEROBIC 5CC  Final   Culture NO GROWTH 2 DAYS  Final   Report Status PENDING  Incomplete  Blood culture (routine x 2)     Status: None (Preliminary result)   Collection Time: 12/13/15  5:45 PM  Result Value Ref Range Status   Specimen Description BLOOD LEFT ANTECUBITAL  Final   Special Requests IN PEDIATRIC BOTTLE 3CC  Final   Culture NO GROWTH 2 DAYS  Final   Report Status PENDING  Incomplete  Urine culture     Status: Abnormal (Preliminary result)   Collection Time: 12/13/15  8:31 PM  Result Value Ref Range Status   Specimen Description URINE, CLEAN CATCH  Final   Special Requests NONE  Final   Culture >=100,000 COLONIES/mL KLEBSIELLA PNEUMONIAE (A)  Final   Report Status PENDING  Incomplete  MRSA PCR Screening     Status: None   Collection  Time: 12/14/15  5:11 AM  Result Value Ref Range  Status   MRSA by PCR NEGATIVE NEGATIVE Final    Comment:        The GeneXpert MRSA Assay (FDA approved for NASAL specimens only), is one component of a comprehensive MRSA colonization surveillance program. It is not intended to diagnose MRSA infection nor to guide or monitor treatment for MRSA infections.          Radiology Studies: [place] Transvaginal Non-ob  Result Date: 12/14/2015 CLINICAL DATA:  Ultrasound was provided for use by the ordering physician, and a technical charge was applied by the performing facility.  No radiologist interpretation/professional services rendered.   [place] Pelvis Complete  Result Date: 12/14/2015 CLINICAL DATA:  4 weeks postpartum, severe anemia, adnexal masses on CT EXAM: TRANSABDOMINAL AND TRANSVAGINAL ULTRASOUND OF PELVIS TECHNIQUE: Both transabdominal and transvaginal ultrasound examinations of the pelvis were performed. Transabdominal technique was performed for global imaging of the pelvis including uterus, ovaries, adnexal regions, and pelvic cul-de-sac. It was necessary to proceed with endovaginal exam following the transabdominal exam to visualize the bilateral adnexa. COMPARISON:  CT abdomen/pelvis dated 12/13/2015 FINDINGS: Uterus Measurements: 16.4 x 6.3 x 7.8 cm. No fibroids or other mass visualized. Endometrium Thickness: 17 mm.  No focal abnormality visualized. Right ovary Not discretely visualized. 14.5 x 7.3 x 8.6 cm complex cystic/tubular lesion with layering hemorrhage/debris in the right adnexa, favored to reflect hematosalpinx. Left ovary Not discretely visualized. 14.7 x 8.5 x 6.7 cm complex cystic/tubular lesion with layering hemorrhage/debris in the left adnexa, favored to reflect hematosalpinx. Other findings No abnormal free fluid. IMPRESSION: Large bilateral hematosalpinx, as above. Endometrial complex measures 17 mm, within normal limits. No gas on CT or ultrasound to suggest  infection/endometritis. Postpartum hematosalpinx can be seen in the setting of uterine scarring related to instrumentation or infection, but it is unclear that either of those scenarios applying to this patient. Additionally, given the clinical picture and CT findings, an underlying coagulopathy (possibly related to hepatic dysfunction) is suspected, although the exact etiology remains unclear on imaging. These results were called by telephone at the time of interpretation on 12/14/2015 at 11:30 am to Dr. Jonette Evaonya Pratt, who verbally acknowledged these results. Electronically Signed   By: Charline BillsSriyesh  Krishnan M.D.   On: 12/14/2015 12:16   [place] Renal  Result Date: 12/15/2015 CLINICAL DATA:  Left hydronephrosis noted on the recent CT. Large bilateral hematosalpinx noted on pelvic ultrasound yesterday. EXAM: RENAL / URINARY TRACT ULTRASOUND COMPLETE COMPARISON:  CT, 11/1929 17.  Pelvic ultrasound, 12/14/2015. FINDINGS: Right Kidney: Length: 13.5 cm. Borderline increased renal parenchymal echogenicity. No mass or stone. Mild dilation of the renal pelvis without caliectasis. Left Kidney: Length: 13.9 cm. Normal parenchymal echogenicity. No mass or stone. Mild hydronephrosis. The Bladder: No bladder mass, stone or wall thickening. Neither ureteral jet was visualized. Spleen measures 14.2 cm longest dimension. Complex pelvic masses consistent with bilateral hematosalpinx is stable from the previous day's ultrasound. IMPRESSION: 1. Mild left hydronephrosis. Mild dilation of the right renal pelvis. This is likely due to the pelvic masses/hematosalpinxes noted on the current and previous day's ultrasound. No renal masses or stones. Electronically Signed   By: Amie Portlandavid  Ormond M.D.   On: 12/15/2015 13:06   [place] Art/ven Flow Abd Pelv Doppler  Result Date: 12/14/2015 CLINICAL DATA:  Hepatomegaly. EXAM: DUPLEX ULTRASOUND OF LIVER TECHNIQUE: Color and duplex Doppler ultrasound was performed to evaluate the hepatic in-flow and out-flow  vessels. COMPARISON:  Same day. FINDINGS: Portal Vein Velocities Main:  42.9 cm/sec Right:  42.2 cm/sec Left:  43.7 cm/sec  Hepatopetal flow is noted. Hepatic Vein Velocities Right:  42.2 cm/sec Middle:  54.1 cm/sec Left:  61.8 cm/sec Hepatofugal flow is noted Hepatic Artery Velocity:  155.5 cm/sec Splenic Vein Velocity:  63.6 cm/sec Varices: None. Ascites: Minimal. Moderate splenomegaly is noted with maximum measured length of 18 cm and calculated volume of 1500 cubic cm. IMPRESSION: No evidence of portal, hepatic or splenic thrombosis or occlusion. Moderate splenomegaly. Minimal ascites. Electronically Signed   By: Lupita RaiderJames  Green Jr, M.D.   On: 12/14/2015 12:28   Dg Chest Port 1 View  Result Date: 12/15/2015 CLINICAL DATA:  Tachypnea EXAM: PORTABLE CHEST 1 VIEW COMPARISON:  12/13/2015 FINDINGS: The patient has developed abnormal density at both lung bases that could be a combination of volume loss, pneumonia and pleural fluid. Upper lungs remain clear. IMPRESSION: Development of abnormal density at both lung bases which could be a combination of atelectasis, pneumonia and pleural fluid. Electronically Signed   By: Paulina FusiMark  Shogry M.D.   On: 12/15/2015 16:18   [place] Abdomen Limited Ruq  Result Date: 12/14/2015 CLINICAL DATA:  Hepatomegaly. EXAM: US ABDOMEN LIMITED - RIGHT UPPER QUADRANT COMPARISON:  CT scan of December 13, 2015. FINDINGS: Gallbladder: No gallstones are noted. Possible sludge is noted within gallbladder lumen. Moderate gallbladder wall thickening is noted measured at 5.8 mm. No pericholecystic fluid or sonographic Murphy's sign is noted. Common bile duct: Diameter: 2.5 mm which is within normal limits. Liver: 1.5 cm simple cyst is seen in right hepatic lobe. Liver appears to be enlarged. Within normal limits in parenchymal echogenicity. IMPRESSION: Probable sludge seen within gallbladder lumen with moderate gallbladder wall thickening. This may be due to adjacent hepatocellular disease. No gallstones  are noted. Liver appears to be enlarged. Electronically Signed   By: Lupita RaiderJames  Green Jr, M.D.   On: 12/14/2015 11:52        Scheduled Meds: . famotidine (PEPCID) IV  20 mg Intravenous Q24H  . fentaNYL   Intravenous Q4H  . furosemide  40 mg Intravenous Once  . piperacillin-tazobactam (ZOSYN)  IV  3.375 g Intravenous Q8H  . vancomycin  750 mg Intravenous Q24H   Continuous Infusions: . sodium chloride 10 mL/hr at 12/15/15 1100     LOS: 2 days    Time spent: 40 minutes     Kimbrely Buckel, Roselind MessierURTIS J, MD [facility] Pager [phone]   If 7PM-7AM, please contact night-coverage [email or web address] Password [email or web address] 12/16/2015, 9:10 AM

## 2015-12-17 DIAGNOSIS — N179 Acute kidney failure, unspecified: Secondary | ICD-10-CM | POA: Diagnosis present

## 2015-12-17 DIAGNOSIS — I5021 Acute systolic (congestive) heart failure: Secondary | ICD-10-CM

## 2015-12-17 LAB — TYPE AND SCREEN
ABO/RH(D): B POS
ANTIBODY SCREEN: NEGATIVE
UNIT DIVISION: 0
UNIT DIVISION: 0
UNIT DIVISION: 0
Unit division: 0
Unit division: 0

## 2015-12-17 LAB — BASIC METABOLIC PANEL
Anion gap: 12 (ref 5–15)
BUN: 26 mg/dL — ABNORMAL HIGH (ref 6–20)
CALCIUM: 8.3 mg/dL — AB (ref 8.9–10.3)
CHLORIDE: 100 mmol/L — AB (ref 101–111)
CO2: 22 mmol/L (ref 22–32)
CREATININE: 2.44 mg/dL — AB (ref 0.44–1.00)
GFR calc Af Amer: 30 mL/min — ABNORMAL LOW (ref >60)
GFR calc non Af Amer: 26 mL/min — ABNORMAL LOW (ref >60)
GLUCOSE: 92 mg/dL (ref 65–99)
Potassium: 2.8 mmol/L — ABNORMAL LOW (ref 3.5–5.1)
Sodium: 134 mmol/L — ABNORMAL LOW (ref 135–145)

## 2015-12-17 LAB — CBC
HEMATOCRIT: 22.3 % — AB (ref 36.0–46.0)
Hemoglobin: 7.3 g/dL — ABNORMAL LOW (ref 12.0–15.0)
MCH: 24.4 pg — AB (ref 26.0–34.0)
MCHC: 32.7 g/dL (ref 30.0–36.0)
MCV: 74.6 fL — AB (ref 78.0–100.0)
Platelets: 210 10*3/uL (ref 150–400)
RBC: 2.99 MIL/uL — ABNORMAL LOW (ref 3.87–5.11)
RDW: 18.3 % — AB (ref 11.5–15.5)
WBC: 7.3 10*3/uL (ref 4.0–10.5)

## 2015-12-17 LAB — MAGNESIUM: Magnesium: 1.7 mg/dL (ref 1.7–2.4)

## 2015-12-17 LAB — PHOSPHORUS: Phosphorus: 6 mg/dL — ABNORMAL HIGH (ref 2.5–4.6)

## 2015-12-17 MED ORDER — FUROSEMIDE 10 MG/ML IJ SOLN
40.0000 mg | Freq: Every day | INTRAMUSCULAR | Status: DC
  Administered 2015-12-17 – 2015-12-19 (×3): 40 mg via INTRAVENOUS
  Filled 2015-12-17 (×3): qty 4

## 2015-12-17 MED ORDER — POTASSIUM CHLORIDE CRYS ER 20 MEQ PO TBCR
50.0000 meq | EXTENDED_RELEASE_TABLET | Freq: Two times a day (BID) | ORAL | Status: AC
  Administered 2015-12-17 (×2): 50 meq via ORAL
  Filled 2015-12-17 (×2): qty 2

## 2015-12-17 MED ORDER — MAGNESIUM SULFATE 2 GM/50ML IV SOLN
2.0000 g | Freq: Once | INTRAVENOUS | Status: AC
  Administered 2015-12-17: 2 g via INTRAVENOUS
  Filled 2015-12-17: qty 50

## 2015-12-17 MED ORDER — FAMOTIDINE 20 MG PO TABS
20.0000 mg | ORAL_TABLET | Freq: Every day | ORAL | Status: DC
  Administered 2015-12-18 – 2015-12-27 (×10): 20 mg via ORAL
  Filled 2015-12-17 (×11): qty 1

## 2015-12-17 NOTE — Progress Notes (Signed)
[facility]  Carolyn Lynn 30 y.o. 06/30/1985   Subjective: Sitting in bedside chair very lethargic. Reports ongoing abdominal pain. Parents in room.  Objective: Vital signs in last 24 hours: Vitals:   12/17/15 1600 12/17/15 1700  BP: 109/70 101/66  Pulse:    Resp: (!) 23 (!) 28  Temp:    T 98, Pulse 104  Physical Exam: Gen: lethargic, no acute distress CV: RRR Chest: coarse breath sounds Abd: protuberant, epigastric tenderness with guarding, +BS  Lab Results:  Recent Labs  12/16/15 0211 12/17/15 0248  NA 135 134*  K 3.7 2.8*  CL 107 100*  CO2 18* 22  GLUCOSE 81 92  BUN 24* 26*  CREATININE 2.13* 2.44*  CALCIUM 8.2* 8.3*  MG 2.2 1.7  PHOS 5.6* 6.0*    Recent Labs  12/15/15 0011  AST 23  ALT 17  ALKPHOS 67  BILITOT 2.4*  PROT 5.6*  ALBUMIN 1.2*    Recent Labs  12/15/15 0011  12/16/15 0211 12/17/15 0248  WBC 11.7*  < > 10.0 7.3  NEUTROABS 9.8*  --   --   --   HGB 7.2*  < > 7.8* 7.3*  HCT 21.6*  < > 23.7* 22.3*  MCV 75.0*  < > 75.5* 74.6*  PLT 185  < > 216 210  < > = values in this interval not displayed.  Recent Labs  12/16/15 0958  LABPROT 17.8*  INR 1.45      Assessment/Plan: Epigastric pain and hepatosplenomegaly in the setting of CHF and shock one month post-partum. I think her hepatomegaly is due to volume congestion from her CHF and her abdominal pain could be due to the enlarged liver pressing on the liver capsule. LFTs normalized. Klebsiella from urine being treated by primary team. Epigastric pain likely to persist for the forseeable future with the CHF but no role for endoscopic procedures. No new recs from GI standpoint. Continue supportive care. Will sign off. Call if questions.   Yasemin Rabon C. 12/17/2015, 5:09 PM  Pager [phone]  If no answer or after 5 PM call [phone] ID: Carolyn Lynn, female   DOB: 01/31/1986, 30 y.o.   MRN: [id number]

## 2015-12-17 NOTE — Progress Notes (Signed)
Patient Name: Carolyn Lynn Date of Encounter: 12/17/2015  Primary Cardiologist: Dr. Williemae Areaandolph  Hospital Problem List     Principal Problem:   Shock [facility](HCC) Active Problems:   AKI (acute kidney injury) (HCC)   Jaundice   Hepatosplenomegaly   Anemia, iron deficiency   Adnexal mass   Abnormal coagulation profile   Anemia   Sepsis (HCC)   Hydronephrosis, left   Peripartum cardiomyopathy   Acute combined systolic and diastolic heart failure (HCC)   Acute systolic CHF (congestive heart failure) (HCC)   Other hydronephrosis   Infective urethritis   Klebsiella cystitis     Subjective   She is feeling a little bit better today. Parents present. Still had a fever this morning. Blood pressure still low but mildly improved. No shortness of breath, no chest pain.  Inpatient Medications    Scheduled Meds: . cefTRIAXone (ROCEPHIN)  IV  1 g Intravenous Q24H  . [START ON 12/18/2015] famotidine  20 mg Oral Daily  . fentaNYL   Intravenous Q4H  . furosemide  40 mg Intravenous Daily  . potassium chloride  50 mEq Oral BID   Continuous Infusions: . sodium chloride 10 mL/hr at 12/16/15 1004   PRN Meds: acetaminophen, diphenhydrAMINE **OR** diphenhydrAMINE, fentaNYL (SUBLIMAZE) injection, naloxone **AND** sodium chloride flush, ondansetron (ZOFRAN) IV   Vital Signs    Vitals:   12/17/15 1400 12/17/15 1500 12/17/15 1600 12/17/15 1700  BP: 103/64 97/62 109/70 101/66  Pulse:      Resp: (!) 24 (!) 31 (!) 23 (!) 28  Temp:      TempSrc:      SpO2:   94%   Weight:      Height:        Intake/Output Summary (Last 24 hours) at 12/17/15 1721 Last data filed at 12/17/15 1500  Gross per 24 hour  Intake              420 ml  Output             4625 ml  Net            -4205 ml   Filed Weights   12/16/15 0405 12/16/15 0922 12/17/15 0500  Weight: 160 lb 0.9 oz (72.6 kg) 160 lb 0.9 oz (72.6 kg) 142 lb 11.2 oz (64.7 kg)    Physical Exam    GEN: Ill-appearing, sitting in chair,  slightly lethargic in no acute distress.  HEENT: Grossly normal.  Neck: Supple, no JVD, carotid bruits, or masses. Cardiac: RRR, no murmurs, rubs, or gallops. No clubbing, cyanosis, positive pedal edema bilaterally, improved pretibial edema.  Radials/DP/PT 2+ and equal bilaterally.  Respiratory:  Respirations regular and unlabored, clear to auscultation bilaterally. GI: Soft, nontender, nondistended, BS + x 4. MS: no deformity or atrophy. Skin: warm and dry, no rash. Neuro:  Strength and sensation are intact. Psych: AAOx3.  Normal affect.  Labs    CBC  Recent Labs  12/15/15 0011  12/16/15 0211 12/17/15 0248  WBC 11.7*  < > 10.0 7.3  NEUTROABS 9.8*  --   --   --   HGB 7.2*  < > 7.8* 7.3*  HCT 21.6*  < > 23.7* 22.3*  MCV 75.0*  < > 75.5* 74.6*  PLT 185  < > 216 210  < > = values in this interval not displayed. Basic Metabolic Panel  Recent Labs  12/16/15 0211 12/17/15 0248  NA 135 134*  K 3.7 2.8*  CL 107 100*  CO2 18*  22  GLUCOSE 81 92  BUN 24* 26*  CREATININE 2.13* 2.44*  CALCIUM 8.2* 8.3*  MG 2.2 1.7  PHOS 5.6* 6.0*   Liver Function Tests  Recent Labs  12/15/15 0011  AST 23  ALT 17  ALKPHOS 67  BILITOT 2.4*  PROT 5.6*  ALBUMIN 1.2*   Telemetry     Sinus tachycardia, no adverse arrhythmias- Personally Reviewed  ECG     - Sinus rhythm, no significant ST segment changes. Personally Reviewed  Radiology    Dg Chest Port 1 View  Result Date: 12/16/2015 CLINICAL DATA:  Shortness of breath when lying down EXAM: PORTABLE CHEST 1 VIEW COMPARISON:  12/15/2015 FINDINGS: Mild pulmonary vascular congestion.  No frank interstitial edema. Small bilateral pleural effusions.  No pneumothorax. Cardiomegaly. IMPRESSION: Cardiomegaly with small bilateral pleural effusions. Mild pulmonary vascular congestion.  No frank interstitial edema. Electronically Signed   By: Charline BillsSriyesh  Krishnan M.D.   On: 12/16/2015 09:23    Cardiac Studies   Echocardiogram 12/15/15: Study  Conclusions  - Left ventricle: The cavity size was mildly dilated. Systolic   function was mildly to moderately reduced. The estimated ejection   fraction was in the range of 40% to 45%. Mild diffuse hypokinesis   with no identifiable regional variations. Left ventricular   diastolic function parameters were mostly normal. Only the medial   annulus diastolic velocity was mildly reduced. - Pericardium, extracardiac: A trivial pericardial effusion was   identified.  Patient Profile     30 year old who presented with profound anemia, newly diagnosed ejection fraction of 40-45% who had an uncomplicated spontaneous vaginal delivery on 11/13/15. She had lower extremity as well as abdominal edema, there was concern for sepsis.  Assessment & Plan    Peripartum cardiomyopathy in the setting of sepsis:  - Her ejection fraction is moderately reduced but not severely reduced. This could be exacerbated by the current systemic illness, sepsis potentially. Her blood pressure has improved slightly with systolics in the 100 range. We are avoiding ace inhibitors as well as spironolactone if she is breast-feeding.  - I agree with pulling back on IV Lasix currently. She was on 40 mg 3 times a day. Her creatinine has increased to 2.4 from 2.1.  - Continuing to treat underlying sepsis  - She is currently -4.5 L out. She states that her lower extremity edema is much improved.  - A.m. cortisol was normal. Trying to work while she is lethargic. Potentially her hypokalemia today could be playing a role.  Hypokalemia  - Replete per primary team.  Anemia  - Per hematology  Hepatomegaly could be from potential volume congestion. GI note reviewed.  Klebsiella urine infection  Signed, Donato SchultzMark Sharnay Cashion, MD  12/17/2015, 5:21 PM

## 2015-12-17 NOTE — Progress Notes (Signed)
PROGRESS NOTE    Carolyn Lynn  ZOX:[id number] DOB: 03-25-1985 DOA: 12/13/2015 PCP: No PCP Per Patient   Brief Narrative:  30 year old BF PMHx none. Postpartum from 11/13/2015   Presenting today with back pain, shortness of breath, fatigue and mild feet swelling and abdominal distention. Patient was seen on the 22nd of this month and at that time was diagnosed with a UTI and constipation. She states since that time symptoms are improved with the shortness of breath has worsened over last 2-3 days. On exam patient is hypotensive and tachycardic. Mental status is within normal limits. She has some mild upper abdominal tenderness but no CVA or suprapubic tenderness. She denies any urinary symptoms at this time. She states her delivery was uncomplicated and her vaginal bleeding has almost completely resolved. Hemoglobin before delivery was 12 but she did not have a repeat done. Patient did not have a hemoglobin check at her emergency visit on the 22nd but had normal renal function at that time. Bedside ultrasound without significant signs of heart strain or cardiomyopathy. Patient's hypotension would suggest that she does not have preeclampsia. Concern for potential PE, anemia, less likely dissection as patient is hypotensive pressures are the same in both arms, abdominal process. Patient's blood pressures are consistently in the 70s and 80s with maps in the 60s. Patient's blood work shows a significant anemia of 4.9. CMP with new acute kidney injury with her creatinine of 2.2. LFTs within normal limits. BMP mildly elevated at 350. Patient typed and screened and blood ordered. Also will do a CT of the abdomen to rule out hydronephrosis or internal bleeding.   Subjective: 11/4   A/O 4, positive of continued back pain, but significantly improved sitting in chair comfortably.. States using PCA~q 40 minutes. Negative CP, positive minimal SOB.   Assessment & Plan:   Principal Problem:   Shock  (HCC) Active Problems:   AKI (acute kidney injury) (HCC)   Jaundice   Hepatosplenomegaly   Anemia, iron deficiency   Adnexal mass   Abnormal coagulation profile   Anemia   Sepsis (HCC)   Hydronephrosis, left   Peripartum cardiomyopathy   Acute combined systolic and diastolic heart failure (HCC)   Acute systolic CHF (congestive heart failure) (HCC)   Other hydronephrosis   Infective urethritis   Klebsiella cystitis   Acute systolic CHF -Strict I&O since admission -3.7 L -Daily weight Filed Weights   12/16/15 0405 12/16/15 0922 12/17/15 0500  Weight: 72.6 kg (160 lb 0.9 oz) 72.6 kg (160 lb 0.9 oz) 64.7 kg (142 lb 11.2 oz)  -Decrease Lasix to 40 mg daily -transfuse for hemoglobin<8  Hypotension/Shock  -Multifactorial to include hypovolemia, hemorrhagic, adrenal insufficiency? -A.m. cortisol normal.  Lt sided Hydronephrosis/Acute Renal failure  Lab Results  Component Value Date   CREATININE 2.44 (H) 12/17/2015   CREATININE 2.13 (H) 12/16/2015   CREATININE 2.32 (H) 12/14/2015  -Monitor closely. Most likely secondary to UTI, hypotensive shock -Cr trending Up, see CHF: Normal saline at [facility] secondary to pulmonary edema  UTI positive Klebsiella pneumoniae -treat as complicated infection. Treat for 14 days. Consider transitioning to PO antibiotic now the patient has defervesced.  Hepatosplenomegaly   Acute anemia -10/31 transfuse 2 units PRBC -11/1 transfuse 2 units PRBC -No obvious hemorrhagic source. -Anemia panel pending.  Hypokalemia -Potassium goal> 4 -K-Dur 50 mEq 2 doses   Hypomagnesemia  -Magnesium goal> 2 -Magnesium IV 2 g      DVT prophylaxis: SCD  Code Status: Full Family Communication: mother present  Discussion of plan of care Disposition Plan:?   Consultants:  [facility] M    Procedures/Significant Events:  10/31 CT abdomen pelvis WO contrast:-enlarged hydrosalpinges (.infection vs blood) next line -Left kidney;suspected hydronephrosis.  - Hepatosplenomegaly. - Small volume free fluid within the abdomen and pelvis with mild diffuse anasarca, suspected  Intrinsic liver disease. - Enlarged periaortic adenopathy  10/31 transfuse 2 units PRBC 11/1 transfuse 2 units PRBC    Cultures 10/31 blood right/left AC NGTD 10/31 urine positive Klebsiella pneumoniae 11/1 MRSA by PCR negative   Antimicrobials: Keflex 10/22 via ER >> 5 days (12/09/15 Zosyn 10/31 > Vancomycin 10/31 >   Devices    LINES / TUBES:      Continuous Infusions: . sodium chloride 10 mL/hr at 12/16/15 1004     Objective: Vitals:   12/17/15 0500 12/17/15 0700 12/17/15 0745 12/17/15 0749  BP: 100/67 106/65  103/62  Pulse:    (!) 104  Resp: (!) 21 (!) 32 (!) 23 (!) 22  Temp:    (!) 100.5 F (38.1 C)  TempSrc:    Oral  SpO2:   92%   Weight: 64.7 kg (142 lb 11.2 oz)     Height:        Intake/Output Summary (Last 24 hours) at 12/17/15 1610 Last data filed at 12/17/15 9604  Gross per 24 hour  Intake              370 ml  Output             7350 ml  Net            -6980 ml   Filed Weights   12/16/15 0405 12/16/15 0922 12/17/15 0500  Weight: 72.6 kg (160 lb 0.9 oz) 72.6 kg (160 lb 0.9 oz) 64.7 kg (142 lb 11.2 oz)    Examination:  General: A/O 4, back pain(Improving) (, No acute respiratory distress Eyes: negative scleral hemorrhage, negative anisocoria, negative icterus ENT: Negative Runny nose, negative gingival bleeding, Neck:  Negative scars, masses, torticollis, lymphadenopathy, JVD Lungs: Clear to auscultation bilaterally, except diminished breath sounds but basilar, without wheezes or crackles Cardiovascular: Regular rate and rhythm without murmur gallop or rub normal S1 and S2 Abdomen: Mild positive abdominal pain, positive distention, positive soft, bowel sounds, no rebound, no ascites, no appreciable mass, positive bilateral CVA tenderness (improving) Extremities: No significant cyanosis, clubbing, positive bilateral lower  extremity edema 2+ to knee Skin: Negative rashes, lesions, ulcers Psychiatric:  Negative depression, negative anxiety, negative fatigue, negative mania  Central nervous system:  Cranial nerves II through XII intact, tongue/uvula midline, all extremities muscle strength 5/5, sensation intact throughout, negative dysarthria, negative expressive aphasia, negative receptive aphasia.  .     Data Reviewed: Care during the described time interval was provided by me .  I have reviewed this patient's available data, including medical history, events of note, physical examination, and all test results as part of my evaluation. I have personally reviewed and interpreted all radiology studies.  CBC:  Recent Labs Lab 12/15/15 0011 12/15/15 1106 12/15/15 1842 12/16/15 0211 12/17/15 0248  WBC 11.7* 11.8* 11.2* 10.0 7.3  NEUTROABS 9.8*  --   --   --   --   HGB 7.2* 7.2* 7.6* 7.8* 7.3*  HCT 21.6* 21.7* 23.0* 23.7* 22.3*  MCV 75.0* 76.4* 75.9* 75.5* 74.6*  PLT 185 195 231 216 210   Basic Metabolic Panel:  Recent Labs Lab 12/14/15 0045 12/14/15 0711 12/14/15 1450 12/15/15 0011 12/16/15 0211 12/17/15 0248  NA 138 139 139  --  135 134*  K 4.0 3.8 4.0  --  3.7 2.8*  CL 111 110 111  --  107 100*  CO2 17* 19* 19*  --  18* 22  GLUCOSE 93 83 93  --  81 92  BUN 24* 24* 24*  --  24* 26*  CREATININE 2.41* 2.39* 2.32*  --  2.13* 2.44*  CALCIUM 7.4* 7.8* 8.0*  --  8.2* 8.3*  MG  --  1.7  --  1.6* 2.2 1.7  PHOS  --  6.0*  --  5.7* 5.6* 6.0*   GFR: Estimated Creatinine Clearance: 29.4 mL/min (by C-G formula based on SCr of 2.44 mg/dL (H)). Liver Function Tests:  Recent Labs Lab 12/13/15 1722 12/14/15 0711 12/15/15 0011  AST 22  --  23  ALT 18  --  17  ALKPHOS 84  --  67  BILITOT 2.0* 3.2* 2.4*  PROT 5.6*  --  5.6*  ALBUMIN 1.4*  --  1.2*    Recent Labs Lab 12/13/15 1722  LIPASE 13   No results for input(s): AMMONIA in the last 168 hours. Coagulation Profile:  Recent Labs Lab  12/13/15 1745 12/14/15 0711 12/16/15 0958  INR 1.49 1.44 1.45   Cardiac Enzymes:  Recent Labs Lab 12/14/15 0711 12/14/15 1450  TROPONINI 0.03*  <0.03 <0.03   BNP (last 3 results) No results for input(s): PROBNP in the last 8760 hours. HbA1C: No results for input(s): HGBA1C in the last 72 hours. CBG:  Recent Labs Lab 12/14/15 0512  GLUCAP 78   Lipid Profile: No results for input(s): CHOL, HDL, LDLCALC, TRIG, CHOLHDL, LDLDIRECT in the last 72 hours. Thyroid Function Tests:  Recent Labs  12/14/15 1450  TSH 3.534   Anemia Panel:  Recent Labs  12/16/15 0958  VITAMINB12 3,852*  FERRITIN 443*  TIBC 130*  IRON 22*   Urine analysis:    Component Value Date/Time   COLORURINE AMBER (A) 12/13/2015 2031   APPEARANCEUR TURBID (A) 12/13/2015 2031   LABSPEC 1.019 12/13/2015 2031   PHURINE 6.0 12/13/2015 2031   GLUCOSEU NEGATIVE 12/13/2015 2031   HGBUR LARGE (A) 12/13/2015 2031   BILIRUBINUR SMALL (A) 12/13/2015 2031   KETONESUR NEGATIVE 12/13/2015 2031   PROTEINUR 100 (A) 12/13/2015 2031   UROBILINOGEN 1.0 03/30/2015 1049   NITRITE POSITIVE (A) 12/13/2015 2031   LEUKOCYTESUR LARGE (A) 12/13/2015 2031   Sepsis Labs: @LABRCNTIP (procalcitonin:4,lacticidven:4)  ) Recent Results (from the past 240 hour(s))  Urine culture     Status: None   Collection Time: 12/11/15  8:40 AM  Result Value Ref Range Status   Specimen Description URINE, CLEAN CATCH  Final   Special Requests NONE  Final   Culture NO GROWTH Performed at [facility]   Final   Report Status 12/12/2015 FINAL  Final  Blood culture (routine x 2)     Status: None (Preliminary result)   Collection Time: 12/13/15  5:22 PM  Result Value Ref Range Status   Specimen Description BLOOD RIGHT ANTECUBITAL  Final   Special Requests BOTTLES DRAWN AEROBIC AND ANAEROBIC 5CC  Final   Culture NO GROWTH 3 DAYS  Final   Report Status PENDING  Incomplete  Blood culture (routine x 2)     Status: None  (Preliminary result)   Collection Time: 12/13/15  5:45 PM  Result Value Ref Range Status   Specimen Description BLOOD LEFT ANTECUBITAL  Final   Special Requests IN PEDIATRIC BOTTLE 3CC  Final  Culture NO GROWTH 3 DAYS  Final   Report Status PENDING  Incomplete  Urine culture     Status: Abnormal   Collection Time: 12/13/15  8:31 PM  Result Value Ref Range Status   Specimen Description URINE, CLEAN CATCH  Final   Special Requests NONE  Final   Culture >=100,000 COLONIES/mL KLEBSIELLA PNEUMONIAE (A)  Final   Report Status 12/16/2015 FINAL  Final   Organism ID, Bacteria KLEBSIELLA PNEUMONIAE (A)  Final      Susceptibility   Klebsiella pneumoniae - MIC*    AMPICILLIN >=32 RESISTANT Resistant     CEFAZOLIN <=4 SENSITIVE Sensitive     CEFTRIAXONE <=1 SENSITIVE Sensitive     CIPROFLOXACIN <=0.25 SENSITIVE Sensitive     GENTAMICIN <=1 SENSITIVE Sensitive     IMIPENEM <=0.25 SENSITIVE Sensitive     NITROFURANTOIN 32 SENSITIVE Sensitive     TRIMETH/SULFA <=20 SENSITIVE Sensitive     AMPICILLIN/SULBACTAM 4 SENSITIVE Sensitive     PIP/TAZO <=4 SENSITIVE Sensitive     Extended ESBL NEGATIVE Sensitive     * >=100,000 COLONIES/mL KLEBSIELLA PNEUMONIAE  MRSA PCR Screening     Status: None   Collection Time: 12/14/15  5:11 AM  Result Value Ref Range Status   MRSA by PCR NEGATIVE NEGATIVE Final    Comment:        The GeneXpert MRSA Assay (FDA approved for NASAL specimens only), is one component of a comprehensive MRSA colonization surveillance program. It is not intended to diagnose MRSA infection nor to guide or monitor treatment for MRSA infections.          Radiology Studies: [place] [facility]  Result Date: 12/15/2015 CLINICAL DATA:  Left hydronephrosis noted on the recent CT. Large bilateral hematosalpinx noted on pelvic ultrasound yesterday. EXAM: RENAL / URINARY TRACT ULTRASOUND COMPLETE COMPARISON:  CT, 11/1929 17.  Pelvic ultrasound, 12/14/2015. FINDINGS: Right Kidney: Length: 13.5  cm. Borderline increased renal parenchymal echogenicity. No mass or stone. Mild dilation of the renal pelvis without caliectasis. Left Kidney: Length: 13.9 cm. Normal parenchymal echogenicity. No mass or stone. Mild hydronephrosis. The Bladder: No bladder mass, stone or wall thickening. Neither ureteral jet was visualized. Spleen measures 14.2 cm longest dimension. Complex pelvic masses consistent with bilateral hematosalpinx is stable from the previous day's ultrasound. IMPRESSION: 1. Mild left hydronephrosis. Mild dilation of the right renal pelvis. This is likely due to the pelvic masses/hematosalpinxes noted on the current and previous day's ultrasound. No renal masses or stones. Electronically Signed   By: Amie Portlandavid  Ormond M.D.   On: 12/15/2015 13:06   Dg Chest Port 1 View  Result Date: 12/16/2015 CLINICAL DATA:  Shortness of breath when lying down EXAM: PORTABLE CHEST 1 VIEW COMPARISON:  12/15/2015 FINDINGS: Mild pulmonary vascular congestion.  No frank interstitial edema. Small bilateral pleural effusions.  No pneumothorax. Cardiomegaly. IMPRESSION: Cardiomegaly with small bilateral pleural effusions. Mild pulmonary vascular congestion.  No frank interstitial edema. Electronically Signed   By: Charline BillsSriyesh  Krishnan M.D.   On: 12/16/2015 09:23   Dg Chest Port 1 View  Result Date: 12/15/2015 CLINICAL DATA:  Tachypnea EXAM: PORTABLE CHEST 1 VIEW COMPARISON:  12/13/2015 FINDINGS: The patient has developed abnormal density at both lung bases that could be a combination of volume loss, pneumonia and pleural fluid. Upper lungs remain clear. IMPRESSION: Development of abnormal density at both lung bases which could be a combination of atelectasis, pneumonia and pleural fluid. Electronically Signed   By: Paulina FusiMark  Shogry M.D.   On: 12/15/2015 16:18  Scheduled Meds: . cefTRIAXone (ROCEPHIN)  IV  1 g Intravenous Q24H  . famotidine (PEPCID) IV  20 mg Intravenous Q12H  . fentaNYL   Intravenous Q4H  .  furosemide  40 mg Intravenous TID   Continuous Infusions: . sodium chloride 10 mL/hr at 12/16/15 1004     LOS: 3 days    Time spent: 40 minutes     Ravina Milner, Roselind MessierURTIS J, MD [facility] Pager [phone]   If 7PM-7AM, please contact night-coverage [email or web address] Password [email or web address] 12/17/2015, 9:38 AM

## 2015-12-18 LAB — BASIC METABOLIC PANEL
Anion gap: 11 (ref 5–15)
BUN: 24 mg/dL — AB (ref 6–20)
CHLORIDE: 96 mmol/L — AB (ref 101–111)
CO2: 25 mmol/L (ref 22–32)
CREATININE: 2.42 mg/dL — AB (ref 0.44–1.00)
Calcium: 8.2 mg/dL — ABNORMAL LOW (ref 8.9–10.3)
GFR, EST AFRICAN AMERICAN: 30 mL/min — AB (ref >60)
GFR, EST NON AFRICAN AMERICAN: 26 mL/min — AB (ref >60)
Glucose, Bld: 94 mg/dL (ref 65–99)
POTASSIUM: 3.5 mmol/L (ref 3.5–5.1)
SODIUM: 132 mmol/L — AB (ref 135–145)

## 2015-12-18 LAB — CBC
HCT: 20.9 % — ABNORMAL LOW (ref 36.0–46.0)
HEMOGLOBIN: 6.8 g/dL — AB (ref 12.0–15.0)
MCH: 24.4 pg — AB (ref 26.0–34.0)
MCHC: 32.5 g/dL (ref 30.0–36.0)
MCV: 74.9 fL — ABNORMAL LOW (ref 78.0–100.0)
PLATELETS: 179 10*3/uL (ref 150–400)
RBC: 2.79 MIL/uL — AB (ref 3.87–5.11)
RDW: 18.4 % — ABNORMAL HIGH (ref 11.5–15.5)
WBC: 5.7 10*3/uL (ref 4.0–10.5)

## 2015-12-18 LAB — PREPARE RBC (CROSSMATCH)

## 2015-12-18 LAB — CULTURE, BLOOD (ROUTINE X 2)
CULTURE: NO GROWTH
Culture: NO GROWTH

## 2015-12-18 LAB — HEMOGLOBIN AND HEMATOCRIT, BLOOD
HEMATOCRIT: 25.5 % — AB (ref 36.0–46.0)
Hemoglobin: 8.3 g/dL — ABNORMAL LOW (ref 12.0–15.0)

## 2015-12-18 LAB — OCCULT BLOOD X 1 CARD TO LAB, STOOL: FECAL OCCULT BLD: NEGATIVE

## 2015-12-18 LAB — MAGNESIUM: MAGNESIUM: 1.9 mg/dL (ref 1.7–2.4)

## 2015-12-18 LAB — PHOSPHORUS: PHOSPHORUS: 5.1 mg/dL — AB (ref 2.5–4.6)

## 2015-12-18 MED ORDER — SODIUM CHLORIDE 0.9 % IV SOLN
Freq: Once | INTRAVENOUS | Status: AC
  Administered 2015-12-18: 05:00:00 via INTRAVENOUS

## 2015-12-18 MED ORDER — VITAMIN C 500 MG PO TABS
500.0000 mg | ORAL_TABLET | Freq: Two times a day (BID) | ORAL | Status: DC
  Administered 2015-12-18 – 2015-12-27 (×18): 500 mg via ORAL
  Filled 2015-12-18 (×17): qty 1

## 2015-12-18 MED ORDER — SODIUM CHLORIDE 0.9 % IV SOLN
25.0000 mg | Freq: Once | INTRAVENOUS | Status: AC
  Administered 2015-12-18: 25 mg via INTRAVENOUS
  Filled 2015-12-18: qty 0.5

## 2015-12-18 MED ORDER — HYDROMORPHONE HCL 1 MG/ML IJ SOLN
2.0000 mg | Freq: Once | INTRAMUSCULAR | Status: DC
  Filled 2015-12-18: qty 2

## 2015-12-18 MED ORDER — SODIUM CHLORIDE 0.9 % IV SOLN
100.0000 mg | Freq: Once | INTRAVENOUS | Status: AC
  Administered 2015-12-19: 100 mg via INTRAVENOUS
  Filled 2015-12-18 (×2): qty 2

## 2015-12-18 NOTE — Progress Notes (Signed)
PROGRESS NOTE    Carolyn KeaJervita J Lynn  ZOX:[id number] DOB: 04/04/1985 DOA: 12/13/2015 PCP: No PCP Per Patient   Brief Narrative:  30 year old BF PMHx none. Postpartum from 11/13/2015   Presenting today with back pain, shortness of breath, fatigue and mild feet swelling and abdominal distention. Patient was seen on the 22nd of this month and at that time was diagnosed with a UTI and constipation. She states since that time symptoms are improved with the shortness of breath has worsened over last 2-3 days. On exam patient is hypotensive and tachycardic. Mental status is within normal limits. She has some mild upper abdominal tenderness but no CVA or suprapubic tenderness. She denies any urinary symptoms at this time. She states her delivery was uncomplicated and her vaginal bleeding has almost completely resolved. Hemoglobin before delivery was 12 but she did not have a repeat done. Patient did not have a hemoglobin check at her emergency visit on the 22nd but had normal renal function at that time. Bedside ultrasound without significant signs of heart strain or cardiomyopathy. Patient's hypotension would suggest that she does not have preeclampsia. Concern for potential PE, anemia, less likely dissection as patient is hypotensive pressures are the same in both arms, abdominal process. Patient's blood pressures are consistently in the 70s and 80s with maps in the 60s. Patient's blood work shows a significant anemia of 4.9. CMP with new acute kidney injury with her creatinine of 2.2. LFTs within normal limits. BMP mildly elevated at 350. Patient typed and screened and blood ordered. Also will do a CT of the abdomen to rule out hydronephrosis or internal bleeding.   Subjective: 11/5   A/O 4, positive of continued back pain, but significantly improved sitting in bed comfortably.. Negative CP, negative SOB.   Assessment & Plan:   Principal Problem:   Shock (HCC) Active Problems:   AKI (acute kidney  injury) (HCC)   Jaundice   Hepatosplenomegaly   Anemia, iron deficiency   Adnexal mass   Abnormal coagulation profile   Anemia   Sepsis (HCC)   Hydronephrosis, left   Peripartum cardiomyopathy   Acute combined systolic and diastolic heart failure (HCC)   Acute systolic CHF (congestive heart failure) (HCC)   Other hydronephrosis   Infective urethritis   Klebsiella cystitis   Acute renal failure (HCC)   Acute systolic CHF -Strict I&O since admission -4.3 L -Daily weight Filed Weights   12/16/15 0922 12/17/15 0500 12/18/15 0404  Weight: 72.6 kg (160 lb 0.9 oz) 64.7 kg (142 lb 11.2 oz) 66.2 kg (146 lb)  -Lasix to 40 mg daily -transfuse for hemoglobin<8  Hypotension/Shock  -Multifactorial to include hypovolemia, hemorrhagic, adrenal insufficiency? -A.m. cortisol normal.  Lt sided Hydronephrosis/Acute Renal failure  Lab Results  Component Value Date   CREATININE 2.42 (H) 12/18/2015   CREATININE 2.44 (H) 12/17/2015   CREATININE 2.13 (H) 12/16/2015  -Monitor closely. Most likely secondary to UTI, hypotensive shock -Cr trending Up, see CHF[facility]  UTI positive Klebsiella pneumoniae -treat as complicated infection. Treat for 14 days. Consider transitioning to PO antibiotic now the patient has defervesced.  Hepatosplenomegaly   Acute anemia/Fe Deficiency anemia -10/31 transfuse 2 units PRBC -11/1 transfuse 2 units PRBC -No obvious hemorrhagic source. -Anemia panel pending. -11/5 Effusion 1 unit PRBC -Iron IV + vitamin C PO   Hypokalemia -Potassium goal> 4 -K-Dur 50 mEq 2 doses   Hypomagnesemia  -Magnesium goal> 2       DVT prophylaxis: SCD  Code Status: Full Family Communication: mother present Discussion of plan of care Disposition Plan:?   Consultants:  [facility] M Dr.Jonathan F Branch Cardiology Dr.James Alcide Clever OB/GYN Dr.Gautam Kermit [facility]     Procedures/Significant Events:  10/31 CT  abdomen pelvis WO contrast:-enlarged hydrosalpinges (.infection vs blood) next line -Left kidney;suspected hydronephrosis. - Hepatosplenomegaly. - Small volume free fluid within the abdomen and pelvis with mild diffuse anasarca, suspected  Intrinsic liver disease. - Enlarged periaortic adenopathy  10/31 transfuse 2 units PRBC 11/1 transfuse 2 units PRBC 11/2 Echocardiogram:Left ventricle: mildly dilated.-LVEF= 40% to 45%. Mild diffuse hypokinesis 11/5 Effusion 1 unit PRBC    Cultures 10/31 blood right/left AC NGTD 10/31 urine positive Klebsiella pneumoniae 11/1 MRSA by PCR negative   Antimicrobials: Keflex 10/22 via ER >> 5 days (12/09/15 Zosyn 10/31 > Vancomycin 10/31 >   Devices    LINES / TUBES:      Continuous Infusions: . sodium chloride 10 mL/hr at 12/16/15 1004     Objective: Vitals:   12/18/15 0700 12/18/15 0743 12/18/15 0800 12/18/15 0845  BP: 100/68  96/68 107/72  Pulse:    78  Resp: (!) 26 (!) 26 18   Temp:   98.8 F (37.1 C) 99.9 F (37.7 C)  TempSrc:   Oral Oral  SpO2:  99%    Weight:      Height:        Intake/Output Summary (Last 24 hours) at 12/18/15 0901 Last data filed at 12/18/15 0844  Gross per 24 hour  Intake              905 ml  Output             1425 ml  Net             -520 ml   Filed Weights   12/16/15 0922 12/17/15 0500 12/18/15 0404  Weight: 72.6 kg (160 lb 0.9 oz) 64.7 kg (142 lb 11.2 oz) 66.2 kg (146 lb)    Examination:  General: A/O 4, back pain(resolved) (, No acute respiratory distress Eyes: negative scleral hemorrhage, negative anisocoria, negative icterus ENT: Negative Runny nose, negative gingival bleeding, Neck:  Negative scars, masses, torticollis, lymphadenopathy, JVD Lungs: Clear to auscultation bilaterally, except diminished breath sounds but basilar, without wheezes or crackles Cardiovascular: Regular rate and rhythm without murmur gallop or rub normal S1 and S2 Abdomen: Mild positive abdominal pain,  positive distention, positive soft, bowel sounds, no rebound, no ascites, no appreciable mass, negative bilateral CVA tenderness  Extremities: No significant cyanosis, clubbing, positive bilateral lower extremity edema 2+ to knee Skin: Negative rashes, lesions, ulcers Psychiatric:  Negative depression, negative anxiety, negative fatigue, negative mania  Central nervous system:  Cranial nerves II through XII intact, tongue/uvula midline, all extremities muscle strength 5/5, sensation intact throughout, negative dysarthria, negative expressive aphasia, negative receptive aphasia.  .     Data Reviewed: Care during the described time interval was provided by me .  I have reviewed this patient's available data, including medical history, events of note, physical examination, and all test results as part of my evaluation. I have personally reviewed and interpreted all radiology studies.  CBC:  Recent Labs Lab 12/15/15 0011 12/15/15 1106 12/15/15 1842 12/16/15 0211 12/17/15 0248 12/18/15 0241  WBC 11.7* 11.8* 11.2* 10.0 7.3 5.7  NEUTROABS 9.8*  --   --   --   --   --   HGB 7.2* 7.2* 7.6* 7.8* 7.3* 6.8*  HCT 21.6* 21.7* 23.0* 23.7* 22.3*  20.9*  MCV 75.0* 76.4* 75.9* 75.5* 74.6* 74.9*  PLT 185 195 231 216 210 179   Basic Metabolic Panel:  Recent Labs Lab 12/14/15 0711 12/14/15 1450 12/15/15 0011 12/16/15 0211 12/17/15 0248 12/18/15 0241  NA 139 139  --  135 134* 132*  K 3.8 4.0  --  3.7 2.8* 3.5  CL 110 111  --  107 100* 96*  CO2 19* 19*  --  18* 22 25  GLUCOSE 83 93  --  81 92 94  BUN 24* 24*  --  24* 26* 24*  CREATININE 2.39* 2.32*  --  2.13* 2.44* 2.42*  CALCIUM 7.8* 8.0*  --  8.2* 8.3* 8.2*  MG 1.7  --  1.6* 2.2 1.7 1.9  PHOS 6.0*  --  5.7* 5.6* 6.0* 5.1*   GFR: Estimated Creatinine Clearance: 32.1 mL/min (by C-G formula based on SCr of 2.42 mg/dL (H)). Liver Function Tests:  Recent Labs Lab 12/13/15 1722 12/14/15 0711 12/15/15 0011  AST 22  --  23  ALT 18  --   17  ALKPHOS 84  --  67  BILITOT 2.0* 3.2* 2.4*  PROT 5.6*  --  5.6*  ALBUMIN 1.4*  --  1.2*    Recent Labs Lab 12/13/15 1722  LIPASE 13   No results for input(s): AMMONIA in the last 168 hours. Coagulation Profile:  Recent Labs Lab 12/13/15 1745 12/14/15 0711 12/16/15 0958  INR 1.49 1.44 1.45   Cardiac Enzymes:  Recent Labs Lab 12/14/15 0711 12/14/15 1450  TROPONINI 0.03*  <0.03 <0.03   BNP (last 3 results) No results for input(s): PROBNP in the last 8760 hours. HbA1C: No results for input(s): HGBA1C in the last 72 hours. CBG:  Recent Labs Lab 12/14/15 0512  GLUCAP 78   Lipid Profile: No results for input(s): CHOL, HDL, LDLCALC, TRIG, CHOLHDL, LDLDIRECT in the last 72 hours. Thyroid Function Tests: No results for input(s): TSH, T4TOTAL, FREET4, T3FREE, THYROIDAB in the last 72 hours. Anemia Panel:  Recent Labs  12/16/15 0958  VITAMINB12 3,852*  FERRITIN 443*  TIBC 130*  IRON 22*   Urine analysis:    Component Value Date/Time   COLORURINE AMBER (A) 12/13/2015 2031   APPEARANCEUR TURBID (A) 12/13/2015 2031   LABSPEC 1.019 12/13/2015 2031   PHURINE 6.0 12/13/2015 2031   GLUCOSEU NEGATIVE 12/13/2015 2031   HGBUR LARGE (A) 12/13/2015 2031   BILIRUBINUR SMALL (A) 12/13/2015 2031   KETONESUR NEGATIVE 12/13/2015 2031   PROTEINUR 100 (A) 12/13/2015 2031   UROBILINOGEN 1.0 03/30/2015 1049   NITRITE POSITIVE (A) 12/13/2015 2031   LEUKOCYTESUR LARGE (A) 12/13/2015 2031   Sepsis Labs: @LABRCNTIP (procalcitonin:4,lacticidven:4)  ) Recent Results (from the past 240 hour(s))  Urine culture     Status: None   Collection Time: 12/11/15  8:40 AM  Result Value Ref Range Status   Specimen Description URINE, CLEAN CATCH  Final   Special Requests NONE  Final   [facility]   Final   Report Status 12/12/2015 FINAL  Final  Blood culture (routine x 2)     Status: None (Preliminary result)   Collection Time: 12/13/15   5:22 PM  Result Value Ref Range Status   Specimen Description BLOOD RIGHT ANTECUBITAL  Final   Special Requests BOTTLES DRAWN AEROBIC AND ANAEROBIC 5CC  Final   Culture NO GROWTH 4 DAYS  Final   Report Status PENDING  Incomplete  Blood culture (routine x 2)  Status: None (Preliminary result)   Collection Time: 12/13/15  5:45 PM  Result Value Ref Range Status   Specimen Description BLOOD LEFT ANTECUBITAL  Final   Special Requests IN PEDIATRIC BOTTLE 3CC  Final   Culture NO GROWTH 4 DAYS  Final   Report Status PENDING  Incomplete  Urine culture     Status: Abnormal   Collection Time: 12/13/15  8:31 PM  Result Value Ref Range Status   Specimen Description URINE, CLEAN CATCH  Final   Special Requests NONE  Final   Culture >=100,000 COLONIES/mL KLEBSIELLA PNEUMONIAE (A)  Final   Report Status 12/16/2015 FINAL  Final   Organism ID, Bacteria KLEBSIELLA PNEUMONIAE (A)  Final      Susceptibility   Klebsiella pneumoniae - MIC*    AMPICILLIN >=32 RESISTANT Resistant     CEFAZOLIN <=4 SENSITIVE Sensitive     CEFTRIAXONE <=1 SENSITIVE Sensitive     CIPROFLOXACIN <=0.25 SENSITIVE Sensitive     GENTAMICIN <=1 SENSITIVE Sensitive     IMIPENEM <=0.25 SENSITIVE Sensitive     NITROFURANTOIN 32 SENSITIVE Sensitive     TRIMETH/SULFA <=20 SENSITIVE Sensitive     AMPICILLIN/SULBACTAM 4 SENSITIVE Sensitive     PIP/TAZO <=4 SENSITIVE Sensitive     Extended ESBL NEGATIVE Sensitive     * >=100,000 COLONIES/mL KLEBSIELLA PNEUMONIAE  MRSA PCR Screening     Status: None   Collection Time: 12/14/15  5:11 AM  Result Value Ref Range Status   MRSA by PCR NEGATIVE NEGATIVE Final    Comment:        The GeneXpert MRSA Assay (FDA approved for NASAL specimens only), is one component of a comprehensive MRSA colonization surveillance program. It is not intended to diagnose MRSA infection nor to guide or monitor treatment for MRSA infections.          Radiology Studies: Dg Chest Port 1  View  Result Date: 12/16/2015 CLINICAL DATA:  Shortness of breath when lying down EXAM: PORTABLE CHEST 1 VIEW COMPARISON:  12/15/2015 FINDINGS: Mild pulmonary vascular congestion.  No frank interstitial edema. Small bilateral pleural effusions.  No pneumothorax. Cardiomegaly. IMPRESSION: Cardiomegaly with small bilateral pleural effusions. Mild pulmonary vascular congestion.  No frank interstitial edema. Electronically Signed   By: Charline Bills M.D.   On: 12/16/2015 09:23        Scheduled Meds: . cefTRIAXone (ROCEPHIN)  IV  1 g Intravenous Q24H  . famotidine  20 mg Oral Daily  . fentaNYL   Intravenous Q4H  . furosemide  40 mg Intravenous Daily  .  HYDROmorphone (DILAUDID) injection  2 mg Intravenous Once   Continuous Infusions: . sodium chloride 10 mL/hr at 12/16/15 1004     LOS: 4 days    Time spent: 40 minutes     Carolyn Lynn, Carolyn Messier, MD [facility] Pager [phone]   If 7PM-7AM, please contact night-coverage [email or web address] Password [email or web address] 12/18/2015, 9:01 AM

## 2015-12-18 NOTE — Progress Notes (Signed)
Subjective:    Fatigued this AM. Denies any SOB  Objective:   Temp:  [98 F (36.7 C)-102.5 F (39.2 C)] 98.3 F (36.8 C) (11/05 0542) Pulse Rate:  [70-97] 70 (11/05 0542) Resp:  [15-31] 26 (11/05 0743) BP: (91-109)/(57-70) 100/68 (11/05 0700) SpO2:  [94 %-100 %] 99 % (11/05 0743) Weight:  [146 lb (66.2 kg)] 146 lb (66.2 kg) (11/05 0404) Last BM Date: 12/17/15  Filed Weights   12/16/15 0922 12/17/15 0500 12/18/15 0404  Weight: 160 lb 0.9 oz (72.6 kg) 142 lb 11.2 oz (64.7 kg) 146 lb (66.2 kg)    Intake/Output Summary (Last 24 hours) at 12/18/15 0749 Last data filed at 12/18/15 0439  Gross per 24 hour  Intake              590 ml  Output             1975 ml  Net            -1385 ml    Telemetry: SR and sinus tach  Exam:  General: NAD  HEENT: sclera clear, throat clear  Resp: CTAB  Cardiac: RRR, no m/r/g, no jvd  ZO:XWRUEAVGI:abdomen soft, NT, ND  MSK: trace bilateral LE edema  Neuro: no focal deficits  Psych: appropriate affect  Lab Results:  Basic Metabolic Panel:  Recent Labs Lab 12/16/15 0211 12/17/15 0248 12/18/15 0241  NA 135 134* 132*  K 3.7 2.8* 3.5  CL 107 100* 96*  CO2 18* 22 25  GLUCOSE 81 92 94  BUN 24* 26* 24*  CREATININE 2.13* 2.44* 2.42*  CALCIUM 8.2* 8.3* 8.2*  MG 2.2 1.7 1.9    Liver Function Tests:  Recent Labs Lab 12/13/15 1722 12/14/15 0711 12/15/15 0011  AST 22  --  23  ALT 18  --  17  ALKPHOS 84  --  67  BILITOT 2.0* 3.2* 2.4*  PROT 5.6*  --  5.6*  ALBUMIN 1.4*  --  1.2*    CBC:  Recent Labs Lab 12/16/15 0211 12/17/15 0248 12/18/15 0241  WBC 10.0 7.3 5.7  HGB 7.8* 7.3* 6.8*  HCT 23.7* 22.3* 20.9*  MCV 75.5* 74.6* 74.9*  PLT 216 210 179    Cardiac Enzymes:  Recent Labs Lab 12/14/15 0711 12/14/15 1450  TROPONINI 0.03*  <0.03 <0.03    BNP: No results for input(s): PROBNP in the last 8760 hours.  Coagulation:  Recent Labs Lab 12/13/15 1745 12/14/15 0711 12/16/15 0958  INR 1.49 1.44 1.45      ECG:   Medications:   Scheduled Medications: . cefTRIAXone (ROCEPHIN)  IV  1 g Intravenous Q24H  . famotidine  20 mg Oral Daily  . fentaNYL   Intravenous Q4H  . furosemide  40 mg Intravenous Daily  .  HYDROmorphone (DILAUDID) injection  2 mg Intravenous Once     Infusions: . sodium chloride 10 mL/hr at 12/16/15 1004     PRN Medications:  acetaminophen, diphenhydrAMINE **OR** diphenhydrAMINE, fentaNYL (SUBLIMAZE) injection, naloxone **AND** sodium chloride flush, ondansetron (ZOFRAN) IV     Assessment/Plan    1. Acute systolic HF - unclear is stress related CM due to UTI and sepsis or peripartum CM related to recent pregnancy.  - acute systolic HF. Occurred in setting of UTI, AKI with left sided hydronephrosis, severe anemia requiring tranfusions.  - LVEF 40-45% - medical therapy limited due to AKI and soft bp's. She verifies to me that she will not be breastfeeding.  - converted to  oral lasix yesterday. She is negative 4.5 liters since admission, uptrend in Cr now trending down. - continue oral lasix. Receiving 1 unit pRBCs this AM, monitor for any increased SOB and consider IV lasix if develops. Given her soft bp's and AKI would not routinely give IV lasix after transfusion without new symptoms.    2. Anemia - per primary team, admit Hgb 4.9 - multiple transfusions this admit  3. AKI - per primary team  4. UTI/Sepsis - per primary team    Dina RichJonathan Goldie Tregoning, M.D., F.A.C.C.Patient ID: Carolyn KeaJervita J Lynn, female   DOB: 09/15/1985, 30 y.o.   MRN: [id number]

## 2015-12-18 NOTE — Progress Notes (Signed)
CRITICAL VALUE ALERT  Critical value received:  Hemoglobin 6.8  Date of notification:  12/18/15  Time of notification:  0330  Critical value read back:YES  Nurse who received alert:  Janice NorrieMisty Ennis, RN, BSN  MD notified (1st page):  Rai. MD  Time of first page:  0339  MD notified (2nd page):  Time of second page:  Responding MD:    Time MD responded:

## 2015-12-19 DIAGNOSIS — I429 Cardiomyopathy, unspecified: Secondary | ICD-10-CM

## 2015-12-19 DIAGNOSIS — E876 Hypokalemia: Secondary | ICD-10-CM | POA: Insufficient documentation

## 2015-12-19 LAB — TYPE AND SCREEN
ABO/RH(D): B POS
Antibody Screen: NEGATIVE
Unit division: 0

## 2015-12-19 LAB — BASIC METABOLIC PANEL
Anion gap: 11 (ref 5–15)
BUN: 25 mg/dL — AB (ref 6–20)
CALCIUM: 8.4 mg/dL — AB (ref 8.9–10.3)
CO2: 25 mmol/L (ref 22–32)
CREATININE: 2.08 mg/dL — AB (ref 0.44–1.00)
Chloride: 95 mmol/L — ABNORMAL LOW (ref 101–111)
GFR calc Af Amer: 36 mL/min — ABNORMAL LOW (ref >60)
GFR, EST NON AFRICAN AMERICAN: 31 mL/min — AB (ref >60)
GLUCOSE: 90 mg/dL (ref 65–99)
POTASSIUM: 3.4 mmol/L — AB (ref 3.5–5.1)
Sodium: 131 mmol/L — ABNORMAL LOW (ref 135–145)

## 2015-12-19 LAB — CBC
HCT: 24.3 % — ABNORMAL LOW (ref 36.0–46.0)
Hemoglobin: 8 g/dL — ABNORMAL LOW (ref 12.0–15.0)
MCH: 25.3 pg — AB (ref 26.0–34.0)
MCHC: 32.9 g/dL (ref 30.0–36.0)
MCV: 76.9 fL — AB (ref 78.0–100.0)
PLATELETS: 182 10*3/uL (ref 150–400)
RBC: 3.16 MIL/uL — ABNORMAL LOW (ref 3.87–5.11)
RDW: 18.7 % — AB (ref 11.5–15.5)
WBC: 7.8 10*3/uL (ref 4.0–10.5)

## 2015-12-19 LAB — PROTHROMBIN GENE MUTATION

## 2015-12-19 LAB — COLD AGGLUTININ TITER: COLD AGGLUTININ TITER: NEGATIVE (ref <1:32)

## 2015-12-19 LAB — FACTOR 5 LEIDEN

## 2015-12-19 MED ORDER — DIPHENHYDRAMINE HCL 25 MG PO CAPS
50.0000 mg | ORAL_CAPSULE | Freq: Once | ORAL | Status: AC
  Administered 2015-12-19: 50 mg via ORAL
  Filled 2015-12-19: qty 2

## 2015-12-19 MED ORDER — SENNOSIDES-DOCUSATE SODIUM 8.6-50 MG PO TABS
1.0000 | ORAL_TABLET | Freq: Two times a day (BID) | ORAL | Status: DC
  Administered 2015-12-19 – 2015-12-27 (×16): 1 via ORAL
  Filled 2015-12-19 (×16): qty 1

## 2015-12-19 MED ORDER — OXYCODONE HCL 5 MG PO TABS
5.0000 mg | ORAL_TABLET | ORAL | Status: DC | PRN
  Administered 2015-12-19 – 2015-12-21 (×7): 10 mg via ORAL
  Administered 2015-12-21: 5 mg via ORAL
  Administered 2015-12-21: 10 mg via ORAL
  Administered 2015-12-21: 5 mg via ORAL
  Administered 2015-12-22 – 2015-12-27 (×13): 10 mg via ORAL
  Filled 2015-12-19 (×2): qty 2
  Filled 2015-12-19: qty 1
  Filled 2015-12-19 (×3): qty 2
  Filled 2015-12-19 (×2): qty 1
  Filled 2015-12-19 (×16): qty 2

## 2015-12-19 MED ORDER — DIPHENHYDRAMINE HCL 25 MG PO CAPS: 25.0000 mg | ORAL_CAPSULE | ORAL | Status: DC | PRN

## 2015-12-19 MED ORDER — POTASSIUM CHLORIDE CRYS ER 20 MEQ PO TBCR
40.0000 meq | EXTENDED_RELEASE_TABLET | Freq: Once | ORAL | Status: AC
  Administered 2015-12-19: 40 meq via ORAL
  Filled 2015-12-19: qty 2

## 2015-12-19 MED ORDER — CEFUROXIME AXETIL 500 MG PO TABS
500.0000 mg | ORAL_TABLET | Freq: Two times a day (BID) | ORAL | Status: DC
  Administered 2015-12-20 – 2015-12-27 (×15): 500 mg via ORAL
  Filled 2015-12-19 (×17): qty 1

## 2015-12-19 MED ORDER — FENTANYL CITRATE (PF) 100 MCG/2ML IJ SOLN
25.0000 ug | INTRAMUSCULAR | Status: DC | PRN
  Administered 2015-12-20: 50 ug via INTRAVENOUS
  Administered 2015-12-25: 25 ug via INTRAVENOUS
  Filled 2015-12-19 (×2): qty 2

## 2015-12-19 NOTE — Progress Notes (Signed)
Fentanyl PCA was discontinued, wasted 5cc of fentanyl in the sink witnessed by Eastman Kodakadine RN.

## 2015-12-19 NOTE — Progress Notes (Signed)
Patient Name: Carolyn Lynn Date of Encounter: 12/19/2015  Primary Cardiologist  Hospital Problem List     Principal Problem:   Shock [facility](HCC) Active Problems:   AKI (acute kidney injury) (HCC)   Jaundice   Hepatosplenomegaly   Anemia, iron deficiency   Adnexal mass   Abnormal coagulation profile   Anemia   Sepsis (HCC)   Hydronephrosis, left   Peripartum cardiomyopathy   Acute combined systolic and diastolic heart failure (HCC)   Acute systolic CHF (congestive heart failure) (HCC)   Other hydronephrosis   Infective urethritis   Klebsiella cystitis   Acute renal failure (HCC)   Cardiomyopathy (HCC)     Subjective   The patient is frail. She is slowly feeling better.  Inpatient Medications    Scheduled Meds: . cefTRIAXone (ROCEPHIN)  IV  1 g Intravenous Q24H  . famotidine  20 mg Oral Daily  . fentaNYL   Intravenous Q4H  . furosemide  40 mg Intravenous Daily  .  HYDROmorphone (DILAUDID) injection  2 mg Intravenous Once  . vitamin C  500 mg Oral BID   Continuous Infusions: . sodium chloride 10 mL/hr at 12/16/15 1004   PRN Meds: acetaminophen, diphenhydrAMINE **OR** diphenhydrAMINE, fentaNYL (SUBLIMAZE) injection, naloxone **AND** sodium chloride flush, ondansetron (ZOFRAN) IV   Vital Signs    Vitals:   12/18/15 2349 12/19/15 0045 12/19/15 0300 12/19/15 0335  BP:   96/65   Pulse:      Resp: 20  20 16   Temp:  98.3 F (36.8 C) 98.3 F (36.8 C)   TempSrc:  Oral Oral   SpO2: 95%  98% 98%  Weight:      Height:        Intake/Output Summary (Last 24 hours) at 12/19/15 0924 Last data filed at 12/19/15 0550  Gross per 24 hour  Intake              900 ml  Output             1700 ml  Net             -800 ml   Filed Weights   12/16/15 0922 12/17/15 0500 12/18/15 0404  Weight: 160 lb 0.9 oz (72.6 kg) 142 lb 11.2 oz (64.7 kg) 146 lb (66.2 kg)    Physical Exam   The patient is oriented to person time and place. She seems fatigued. Family members in the  room. Oxygen is in place. Lungs reveal decreased breath sounds. Cardiac exam reveals an S1 and S2. She does still have mild pitting edema.  Labs    CBC  Recent Labs  12/18/15 0241 12/18/15 1040 12/19/15 0237  WBC 5.7  --  7.8  HGB 6.8* 8.3* 8.0*  HCT 20.9* 25.5* 24.3*  MCV 74.9*  --  76.9*  PLT 179  --  182   Basic Metabolic Panel  Recent Labs  12/17/15 0248 12/18/15 0241 12/19/15 0237  NA 134* 132* 131*  K 2.8* 3.5 3.4*  CL 100* 96* 95*  CO2 22 25 25   GLUCOSE 92 94 90  BUN 26* 24* 25*  CREATININE 2.44* 2.42* 2.08*  CALCIUM 8.3* 8.2* 8.4*  MG 1.7 1.9  --   PHOS 6.0* 5.1*  --    Liver Function Tests No results for input(s): AST, ALT, ALKPHOS, BILITOT, PROT, ALBUMIN in the last 72 hours. No results for input(s): LIPASE, AMYLASE in the last 72 hours. Cardiac Enzymes No results for input(s): CKTOTAL, CKMB, CKMBINDEX, TROPONINI in the  last 72 hours. BNP Invalid input(s): POCBNP D-Dimer No results for input(s): DDIMER in the last 72 hours. Hemoglobin A1C No results for input(s): HGBA1C in the last 72 hours. Fasting Lipid Panel No results for input(s): CHOL, HDL, LDLCALC, TRIG, CHOLHDL, LDLDIRECT in the last 72 hours. Thyroid Function Tests No results for input(s): TSH, T4TOTAL, T3FREE, THYROIDAB in the last 72 hours.  Invalid input(s): FREET3  Telemetry    ECG     Radiology    No results found.  Cardiac Studies     Patient Profile      One component of the patient's admission is volume overload congestive heart failure with mild decrease in LV function. It is unclear if this could be stress related CM due to UTI and sepsis or peripartum CM related to recent pregnancy.  - acute systolic HF. Occurred in setting of UTI, AKI with left sided hydronephrosis, severe anemia requiring tranfusions.  - LVEF 40-45%  Assessment & Plan    1. Acute systolic HF - unclear is stress related CM due to UTI and sepsis or peripartum CM related to recent pregnancy.  -  acute systolic HF. Occurred in setting of UTI, AKI with left sided hydronephrosis, severe anemia requiring tranfusions.  - LVEF 40-45% - medical therapy limited due to AKI and soft bp's. She has a verified that she will not be breastfeeding.  - converted to oral lasix. She continues to diurese. The prior uptrend in Cr now trending down. Creatinine continues to improve today. I will continue IV Lasix today.. I will see her tomorrow before deciding about Lasix dosing. Received 1 unit pRBCs yesterday. Given her soft bp's and AKI we need to be very careful with diuresis.  2. Anemia - per primary team, admit Hgb 4.9 - multiple transfusions this admit. Fortunately hemoglobin is now up to 8.  3. AKI - per primary team This continues to improve with careful diuresis.  4. UTI/Sepsis - per primary team    Other hydronephrosis    Cardiomyopathy (HCC)     Etiology of decreased ejection fraction is not clear. She will need follow-up echo in the future. Also medications for decreased ejection fraction can be tried at a later date when we are sure that her renal function is completely stable. Heart rate is not elevated. Therefore beta blockade has not been added yet either.    Hypokalemia potassium is improving but still slightly reduced.    This is being overseen by the primary care team.  Signed, Willa RoughJeffrey Vera Wishart, MD  12/19/2015, 9:24 AM

## 2015-12-19 NOTE — Progress Notes (Signed)
[place] TEAM 1 - Stepdown/ICU TEAM  LISSETT FAVORITE  ZOX:[id number] DOB: [birth year] DOA: 12/13/2015 PCP: No PCP Per Patient    Brief Narrative:  30 year old F Hx childbirth 11/13/2015 who presented to the hospital with back pain, shortness of breath, fatigue, feet swelling and abdominal distention. On exam she was hypotensive and tachycardic. Patient's blood pressures were consistently in the 70s and 80s. Patient's blood work showed a Hgb of 4.9. CMP with new acute kidney injury with her creatinine of 2.2.   Subjective: The patient is given a bedside chair. She reports her PCA as "making her feel funny and have bad dreams."  She states her pain is well-controlled. She denies chest pain shortness of breath nausea vomiting fevers or chills.  Assessment & Plan:  Acute systolic CHF  -?peripartum CM - Cards has evaluated - to have f/u TTE as outpt - no BB due to hypotension - no ACEi/ARB due to AKI Filed Weights   12/16/15 0922 12/17/15 0500 12/18/15 0404  Weight: 72.6 kg (160 lb 0.9 oz) 64.7 kg (142 lb 11.2 oz) 66.2 kg (146 lb)    Hypotension / Shock  -due to hypovolemia, cardiogenic shock, and hemorrhage - BP has stabilized but remains low - follow  Acute Renal failure - Mild L Hydronephrosis -secondary to UTI and hypotensive shock - mild hydro felt to be due to hematosalpinx - crt trending down at this time - follow trend - avoid nephrotoxins   Recent Labs Lab 12/14/15 1450 12/16/15 0211 12/17/15 0248 12/18/15 0241 12/19/15 0237  CREATININE 2.32* 2.13* 2.44* 2.42* 2.08*    Klebsiella pneumoniae UTI -plan to treat for 14 days- transition to oral abx   Large bilateral hematosalpinx Noted on transvaginal US - GYN has evaluated   Hepatosplenomegaly Felt to likely be due to passive congestion in the setting of acute heart failure   Acute anemia / Fe Deficiency anemia Hgb stabilizing   Recent Labs Lab 12/16/15 0211 12/17/15 0248 12/18/15 0241 12/18/15 1040  12/19/15 0237  HGB 7.8* 7.3* 6.8* 8.3* 8.0*    Hypokalemia -replace to goal of 4.0 or >  Hypomagnesemia -corrected   DVT prophylaxis: SCDs Code Status: FULL CODE Family Communication: Spoke with mother at bedside  Disposition Plan:   Consultants:  [facility]  [facility] OB/GYN Oncology [facility]    Procedures: 10/31 CT abdomen pelvis WO contrast - enlarged hydrosalpinges (.infection vs blood) -Left kidney;suspected hydronephrosis. - Hepatosplenomegaly. - Small volume free fluid within the abdomen and pelvis with mild diffuse anasarca, suspected  Intrinsic liver disease. - Enlarged periaortic adenopathy  11/2 TTE EF 40% to 45%. Mild diffuse hypokinesis  Antimicrobials:  Zosyn 10/31 > 11/2 Vancomycin 10/31 > 11/2 Rocephin 11/3 >   Objective: Blood pressure 105/62, pulse 76, temperature 99.1 F (37.3 C), temperature source Oral, resp. rate (!) 21, height 5\' 4"  (1.626 m), weight 66.2 kg (146 lb), last menstrual period 12/10/2014, SpO2 96 %, unknown if currently breastfeeding.  Intake/Output Summary (Last 24 hours) at 12/19/15 1547 Last data filed at 12/19/15 1214  Gross per 24 hour  Intake          1009.17 ml  Output             1450 ml  Net          -440.83 ml   Filed Weights   12/16/15 0922 12/17/15 0500 12/18/15 0404  Weight: 72.6 kg (160 lb 0.9 oz) 64.7 kg (142 lb 11.2 oz) 66.2 kg (146 lb)  Examination: General: No acute respiratory distress Lungs: Clear to auscultation bilaterally without wheezes or crackles Cardiovascular: Regular rate and rhythm without murmur gallop or rub normal S1 and S2 Abdomen: Mildly tender diffusely, nondistended, soft, bowel sounds positive, no rebound, no ascites, no appreciable mass Extremities: No significant cyanosis, clubbing, or edema bilateral lower extremities  CBC:  Recent Labs Lab 12/15/15 0011  12/15/15 1842 12/16/15 0211 12/17/15 0248 12/18/15 0241 12/18/15 1040 12/19/15 0237  WBC 11.7*  < > 11.2* 10.0 7.3  5.7  --  7.8  NEUTROABS 9.8*  --   --   --   --   --   --   --   HGB 7.2*  < > 7.6* 7.8* 7.3* 6.8* 8.3* 8.0*  HCT 21.6*  < > 23.0* 23.7* 22.3* 20.9* 25.5* 24.3*  MCV 75.0*  < > 75.9* 75.5* 74.6* 74.9*  --  76.9*  PLT 185  < > 231 216 210 179  --  182  < > = values in this interval not displayed.   Basic Metabolic Panel:  Recent Labs Lab 12/14/15 0711 12/14/15 1450 12/15/15 0011 12/16/15 0211 12/17/15 0248 12/18/15 0241 12/19/15 0237  NA 139 139  --  135 134* 132* 131*  K 3.8 4.0  --  3.7 2.8* 3.5 3.4*  CL 110 111  --  107 100* 96* 95*  CO2 19* 19*  --  18* 22 25 25   GLUCOSE 83 93  --  81 92 94 90  BUN 24* 24*  --  24* 26* 24* 25*  CREATININE 2.39* 2.32*  --  2.13* 2.44* 2.42* 2.08*  CALCIUM 7.8* 8.0*  --  8.2* 8.3* 8.2* 8.4*  MG 1.7  --  1.6* 2.2 1.7 1.9  --   PHOS 6.0*  --  5.7* 5.6* 6.0* 5.1*  --    GFR: Estimated Creatinine Clearance: 37.4 mL/min (by C-G formula based on SCr of 2.08 mg/dL (H)).  Liver Function Tests:  Recent Labs Lab 12/13/15 1722 12/14/15 0711 12/15/15 0011  AST 22  --  23  ALT 18  --  17  ALKPHOS 84  --  67  BILITOT 2.0* 3.2* 2.4*  PROT 5.6*  --  5.6*  ALBUMIN 1.4*  --  1.2*    Recent Labs Lab 12/13/15 1722  LIPASE 13    Coagulation Profile:  Recent Labs Lab 12/13/15 1745 12/14/15 0711 12/16/15 0958  INR 1.49 1.44 1.45    Cardiac Enzymes:  Recent Labs Lab 12/14/15 0711 12/14/15 1450  TROPONINI 0.03*  <0.03 <0.03    CBG:  Recent Labs Lab 12/14/15 0512  GLUCAP 78    Recent Results (from the past 240 hour(s))  Urine culture     Status: None   Collection Time: 12/11/15  8:40 AM  Result Value Ref Range Status   Specimen Description URINE, CLEAN CATCH  Final   Special Requests NONE  Final   Culture [facility] GR[facility]TH [facility]   Final   Report Status 12/12/2015 FINAL  Final  Blood culture (routine x 2)     Status: None   Collection Time: 12/13/15  5:22 PM  Result Value Ref Range Status    Specimen Description BLOOD RIGHT ANTECUBITAL  Final   Special Requests BOTTLES DRAWN AEROBIC AND ANAEROBIC 5CC  Final   Culture NO GROWTH 5 DAYS  Final   Report Status 12/18/2015 FINAL  Final  Blood culture (routine x 2)     Status: None   Collection Time:  12/13/15  5:45 PM  Result Value Ref Range Status   Specimen Description BLOOD LEFT ANTECUBITAL  Final   Special Requests IN PEDIATRIC BOTTLE 3CC  Final   Culture NO GROWTH 5 DAYS  Final   Report Status 12/18/2015 FINAL  Final  Urine culture     Status: Abnormal   Collection Time: 12/13/15  8:31 PM  Result Value Ref Range Status   Specimen Description URINE, CLEAN CATCH  Final   Special Requests NONE  Final   Culture >=100,000 COLONIES/mL KLEBSIELLA PNEUMONIAE (A)  Final   Report Status 12/16/2015 FINAL  Final   Organism ID, Bacteria KLEBSIELLA PNEUMONIAE (A)  Final      Susceptibility   Klebsiella pneumoniae - MIC*    AMPICILLIN >=32 RESISTANT Resistant     CEFAZOLIN <=4 SENSITIVE Sensitive     CEFTRIAXONE <=1 SENSITIVE Sensitive     CIPROFLOXACIN <=0.25 SENSITIVE Sensitive     GENTAMICIN <=1 SENSITIVE Sensitive     IMIPENEM <=0.25 SENSITIVE Sensitive     NITROFURANTOIN 32 SENSITIVE Sensitive     TRIMETH/SULFA <=20 SENSITIVE Sensitive     AMPICILLIN/SULBACTAM 4 SENSITIVE Sensitive     PIP/TAZO <=4 SENSITIVE Sensitive     Extended ESBL NEGATIVE Sensitive     * >=100,000 COLONIES/mL KLEBSIELLA PNEUMONIAE  MRSA PCR Screening     Status: None   Collection Time: 12/14/15  5:11 AM  Result Value Ref Range Status   MRSA by PCR NEGATIVE NEGATIVE Final    Comment:        The GeneXpert MRSA Assay (FDA approved for NASAL specimens only), is one component of a comprehensive MRSA colonization surveillance program. It is not intended to diagnose MRSA infection nor to guide or monitor treatment for MRSA infections.      Scheduled Meds: . cefTRIAXone (ROCEPHIN)  IV  1 g Intravenous Q24H  . famotidine  20 mg Oral Daily  .  fentaNYL   Intravenous Q4H  .  HYDROmorphone (DILAUDID) injection  2 mg Intravenous Once  . vitamin C  500 mg Oral BID     LOS: 5 days   Lonia BloodJeffrey T. Siyona Coto, MD [facility] Office  [phone] Pager - Text Page per Amion as per below:  On-Call/Text Page:      [email or web address]      password [email or web address]  If 7PM-7AM, please contact night-coverage [email or web address] Password [email or web address] 12/19/2015, 3:47 PM

## 2015-12-20 DIAGNOSIS — B9689 Other specified bacterial agents as the cause of diseases classified elsewhere: Secondary | ICD-10-CM | POA: Diagnosis present

## 2015-12-20 DIAGNOSIS — N390 Urinary tract infection, site not specified: Secondary | ICD-10-CM | POA: Diagnosis present

## 2015-12-20 DIAGNOSIS — D508 Other iron deficiency anemias: Secondary | ICD-10-CM

## 2015-12-20 DIAGNOSIS — B961 Klebsiella pneumoniae [K. pneumoniae] as the cause of diseases classified elsewhere: Secondary | ICD-10-CM

## 2015-12-20 LAB — CBC
HCT: 28.2 % — ABNORMAL LOW (ref 36.0–46.0)
Hemoglobin: 9 g/dL — ABNORMAL LOW (ref 12.0–15.0)
MCH: 24.7 pg — ABNORMAL LOW (ref 26.0–34.0)
MCHC: 31.9 g/dL (ref 30.0–36.0)
MCV: 77.5 fL — AB (ref 78.0–100.0)
PLATELETS: 225 10*3/uL (ref 150–400)
RBC: 3.64 MIL/uL — AB (ref 3.87–5.11)
RDW: 18.3 % — AB (ref 11.5–15.5)
WBC: 10 10*3/uL (ref 4.0–10.5)

## 2015-12-20 LAB — HEMOGLOBINOPATHY EVALUATION
HGB C: 0 %
HGB F QUANT: 0 % (ref 0.0–2.0)
HGB S QUANTITAION: 0 %
Hgb A2 Quant: 2.4 % (ref 0.7–3.1)
Hgb A: 97.6 % (ref 94.0–98.0)

## 2015-12-20 LAB — HEPATIC FUNCTION PANEL
ALK PHOS: 140 U/L — AB (ref 38–126)
ALT: 14 U/L (ref 14–54)
AST: 23 U/L (ref 15–41)
Albumin: 1.9 g/dL — ABNORMAL LOW (ref 3.5–5.0)
BILIRUBIN DIRECT: 0.7 mg/dL — AB (ref 0.1–0.5)
BILIRUBIN INDIRECT: 0.8 mg/dL (ref 0.3–0.9)
BILIRUBIN TOTAL: 1.5 mg/dL — AB (ref 0.3–1.2)
Total Protein: 7.8 g/dL (ref 6.5–8.1)

## 2015-12-20 LAB — RENAL FUNCTION PANEL
Albumin: 1.9 g/dL — ABNORMAL LOW (ref 3.5–5.0)
Anion gap: 13 (ref 5–15)
BUN: 26 mg/dL — AB (ref 6–20)
CALCIUM: 8.7 mg/dL — AB (ref 8.9–10.3)
CHLORIDE: 96 mmol/L — AB (ref 101–111)
CO2: 23 mmol/L (ref 22–32)
CREATININE: 1.87 mg/dL — AB (ref 0.44–1.00)
GFR calc non Af Amer: 35 mL/min — ABNORMAL LOW (ref >60)
GFR, EST AFRICAN AMERICAN: 41 mL/min — AB (ref >60)
GLUCOSE: 91 mg/dL (ref 65–99)
Phosphorus: 4.4 mg/dL (ref 2.5–4.6)
Potassium: 3.7 mmol/L (ref 3.5–5.1)
SODIUM: 132 mmol/L — AB (ref 135–145)

## 2015-12-20 MED ORDER — FERROUS SULFATE 325 (65 FE) MG PO TABS
325.0000 mg | ORAL_TABLET | Freq: Two times a day (BID) | ORAL | Status: DC
  Administered 2015-12-21 – 2015-12-27 (×13): 325 mg via ORAL
  Filled 2015-12-20 (×13): qty 1

## 2015-12-20 NOTE — Progress Notes (Signed)
    Subjective:  Feeling ok this evening. Sitting in chair after dinner. Family at bedside. No CP or dyspnea. Eager to be discharged home.  Objective:  Vital Signs in the last 24 hours: Temp:  [97.2 F (36.2 C)-100.9 F (38.3 C)] 99.6 F (37.6 C) (11/07 1656) Pulse Rate:  [52-95] 77 (11/07 1741) Resp:  [13-23] 20 (11/07 1741) BP: (98-114)/(63-80) 108/80 (11/07 1656) SpO2:  [87 %-97 %] 97 % (11/07 1741) Weight:  [61.1 kg (134 lb 11.2 oz)] 61.1 kg (134 lb 11.2 oz) (11/07 0427)  Intake/Output from previous day: 11/06 0701 - 11/07 0700 In: 845 [P.O.:600; I.V.:195; IV Piggyback:50] Out: 475 [Urine:475]  Physical Exam: Pt is alert and oriented, thin woman in NAD HEENT: normal Neck: JVP - mildly elevated Lungs: CTA bilaterally CV: RRR without murmur or gallop Abd: soft, NT, Positive BS, no hepatomegaly Ext: no C/C/E Skin: warm/dry no rash   Lab Results:  Recent Labs  12/19/15 0237 12/20/15 0214  WBC 7.8 10.0  HGB 8.0* 9.0*  PLT 182 225    Recent Labs  12/19/15 0237 12/20/15 0214  NA 131* 132*  K 3.4* 3.7  CL 95* 96*  CO2 25 23  GLUCOSE 90 91  BUN 25* 26*  CREATININE 2.08* 1.87*   No results for input(s): TROPONINI in the last 72 hours.  Invalid input(s): CK, MB  Assessment/Plan:  1. Acute systolic CHF: appears euvolemic on exam. BP intermittently soft and with multiple ongoing medical issues I think best to avoid ACE/ARB or beta-blocker at present. Would follow cardiac status and plan on outpatient echo in 2-3 months.  2. Anemia: improved with PRBC transfusions. HgB up to 9 mg/dL.  3. AKI: creatinine has peaked and now trending down, 1.8 today  Tonny Bollmanooper, [place] L[place]n, M.D. 12/20/2015, 7:12 PM

## 2015-12-20 NOTE — Progress Notes (Signed)
PROGRESS NOTE    Carolyn Lynn  ZOX:[id number] DOB: 05/26/1985 DOA: 12/13/2015 PCP: No PCP Per Patient   Brief Narrative:  30 year old BF PMHx none. Postpartum from 11/13/2015   Presenting today with back pain, shortness of breath, fatigue and mild feet swelling and abdominal distention. Patient was seen on the 22nd of this month and at that time was diagnosed with a UTI and constipation. She states since that time symptoms are improved with the shortness of breath has worsened over last 2-3 days. On exam patient is hypotensive and tachycardic. Mental status is within normal limits. She has some mild upper abdominal tenderness but no CVA or suprapubic tenderness. She denies any urinary symptoms at this time. She states her delivery was uncomplicated and her vaginal bleeding has almost completely resolved. Hemoglobin before delivery was 12 but she did not have a repeat done. Patient did not have a hemoglobin check at her emergency visit on the 22nd but had normal renal function at that time. Bedside ultrasound without significant signs of heart strain or cardiomyopathy. Patient's hypotension would suggest that she does not have preeclampsia. Concern for potential PE, anemia, less likely dissection as patient is hypotensive pressures are the same in both arms, abdominal process. Patient's blood pressures are consistently in the 70s and 80s with maps in the 60s. Patient's blood work shows a significant anemia of 4.9. CMP with new acute kidney injury with her creatinine of 2.2. LFTs within normal limits. BMP mildly elevated at 350. Patient typed and screened and blood ordered. Also will do a CT of the abdomen to rule out hydronephrosis or internal bleeding.   Subjective: 11/7   A/O 4, positive of continued back pain, but significantly improved sitting in bed comfortably.. Negative CP, negative SOB.   Assessment & Plan:   Principal Problem:   Shock (HCC) Active Problems:   AKI (acute kidney  injury) (HCC)   Jaundice   Hepatosplenomegaly   Anemia, iron deficiency   Adnexal mass   Abnormal coagulation profile   Anemia   Sepsis (HCC)   Hydronephrosis, left   Peripartum cardiomyopathy   Acute combined systolic and diastolic heart failure (HCC)   Acute systolic CHF (congestive heart failure) (HCC)   Other hydronephrosis   Infective urethritis   Klebsiella cystitis   Acute renal failure (HCC)   Cardiomyopathy (HCC)   Hypokalemia   Acute systolic CHF -Strict I&O since admission -4.0 L -Daily weight Filed Weights   12/17/15 0500 12/18/15 0404 12/20/15 0427  Weight: 64.7 kg (142 lb 11.2 oz) 66.2 kg (146 lb) 61.1 kg (134 lb 11.2 oz)  -transfuse for hemoglobin<8  Hypotension/Shock  -Multifactorial to include hypovolemia, hemorrhagic, adrenal insufficiency? -A.m. cortisol normal.  Lt sided Hydronephrosis/Acute Renal failure  Lab Results  Component Value Date   CREATININE 1.87 (H) 12/20/2015   CREATININE 2.08 (H) 12/19/2015   CREATININE 2.42 (H) 12/18/2015  -Monitor closely. Most likely secondary to UTI, hypotensive shock -Cr trending Up, see CHF: [facility]  UTI positive Klebsiella pneumoniae -treat as complicated infection. Treat for 14 days. Consider transitioning to PO antibiotic now the patient has defervesced.  Hepatosplenomegaly   Acute anemia/Fe Deficiency anemia -10/31 transfuse 2 units PRBC -11/1 transfuse 2 units PRBC -No obvious hemorrhagic source. -Anemia panel pending. -11/5 Effusion 1 unit PRBC -Iron IV + vitamin C PO  -Iron 325 mg PO BID + vitamin C 500 mg BID  Hypokalemia -Potassium goal> 4  Hypomagnesemia  -Magnesium goal> 2  DVT prophylaxis: SCD  Code Status: Full Family Communication: mother present Discussion of plan of care Disposition Plan:?   Cons[facility]ants:  [facility] Dr.James Alcide CleverG Arnold OB/GYN Dr.Gautam Kermit BaloKishore Kale  Oncology     Procedures/Significant Events:  10/31 CT abdomen pelvis WO contrast:-enlarged hydrosalpinges (.infection vs blood) next line -Left kidney;suspected hydronephrosis. - Hepatosplenomegaly. - Small volume free fluid within the abdomen and pelvis with mild diffuse anasarca, suspected  Intrinsic liver disease. - Enlarged periaortic adenopathy  10/31 transfuse 2 units PRBC 11/1 transfuse 2 units PRBC 11/2 Echocardiogram:Left ventricle: mildly dilated.-LVEF= 40% to 45%. Mild diffuse hypokinesis 11/5 Effusion 1 unit PRBC    Cultures 10/31 blood right/left AC NGTD 10/31 urine positive Klebsiella pneumoniae 11/1 MRSA by PCR negative   Antimicrobials: Keflex 10/22 via ER >> 5 days (12/09/15 Zosyn 11/1>> 11/3 Vancomycin 11/1>> 11/3 Ceftriaxone 10/31>> 11/6 Ceftin 11/7>>   Devices    LINES / TUBES:      Continuous Infusions:    Objective: Vitals:   12/20/15 0500 12/20/15 0600 12/20/15 0801 12/20/15 1112  BP: 107/69 113/76 114/72   Pulse: (!) 52 (!) 53 79 95  Resp: 14 20 18 15   Temp:   98.2 F (36.8 C)   TempSrc:   Oral   SpO2: 95% 90% 96% 93%  Weight:      Height:        Intake/Output Summary (Last 24 hours) at 12/20/15 1143 Last data filed at 12/20/15 0400  Gross per 24 hour  Intake              410 ml  Output              175 ml  Net              235 ml   Filed Weights   12/17/15 0500 12/18/15 0404 12/20/15 0427  Weight: 64.7 kg (142 lb 11.2 oz) 66.2 kg (146 lb) 61.1 kg (134 lb 11.2 oz)    Examination:  General: A/O 4, back pain(resolved) (, No acute respiratory distress Eyes: negative scleral hemorrhage, negative anisocoria, negative icterus ENT: Negative Runny nose, negative gingival bleeding, Neck:  Negative scars, masses, torticollis, lymphadenopathy, JVD Lungs: Clear to auscultation bilaterally, except diminished breath sounds but basilar, without wheezes or crackles Cardiovascular: Regular rate and rhythm without murmur gallop or rub  normal S1 and S2 Abdomen: Mild positive abdominal pain, positive distention, positive soft, bowel sounds, no rebound, no ascites, no appreciable mass, negative bilateral CVA tenderness  Extremities: No significant cyanosis, clubbing, positive bilateral lower extremity edema 2+ to knee Skin: Negative rashes, lesions, ulcers Psychiatric:  Negative depression, negative anxiety, negative fatigue, negative mania  Central nervous system:  Cranial nerves II through XII intact, tongue/uvula midline, all extremities muscle strength 5/5, sensation intact throughout, negative dysarthria, negative expressive aphasia, negative receptive aphasia.  .     Data Reviewed: Care during the described time interval was provided by me .  I have reviewed this patient's available data, including medical history, events of note, physical examination, and all test results as part of my evaluation. I have personally reviewed and interpreted all radiology studies.  CBC:  Recent Labs Lab 12/15/15 0011  12/16/15 0211 12/17/15 0248 12/18/15 0241 12/18/15 1040 12/19/15 0237 12/20/15 0214  WBC 11.7*  < > 10.0 7.3 5.7  --  7.8 10.0  NEUTROABS 9.8*  --   --   --   --   --   --   --  HGB 7.2*  < > 7.8* 7.3* 6.8* 8.3* 8.0* 9.0*  HCT 21.6*  < > 23.7* 22.3* 20.9* 25.5* 24.3* 28.2*  MCV 75.0*  < > 75.5* 74.6* 74.9*  --  76.9* 77.5*  PLT 185  < > 216 210 179  --  182 225  < > = values in this interval not displayed. Basic Metabolic Panel:  Recent Labs Lab 12/14/15 0711  12/15/15 0011 12/16/15 0211 12/17/15 0248 12/18/15 0241 12/19/15 0237 12/20/15 0214  NA 139  < >  --  135 134* 132* 131* 132*  K 3.8  < >  --  3.7 2.8* 3.5 3.4* 3.7  CL 110  < >  --  107 100* 96* 95* 96*  CO2 19*  < >  --  18* 22 25 25 23   GLUCOSE 83  < >  --  81 92 94 90 91  BUN 24*  < >  --  24* 26* 24* 25* 26*  CREATININE 2.39*  < >  --  2.13* 2.44* 2.42* 2.08* 1.87*  CALCIUM 7.8*  < >  --  8.2* 8.3* 8.2* 8.4* 8.7*  MG 1.7  --  1.6* 2.2  1.7 1.9  --   --   PHOS 6.0*  --  5.7* 5.6* 6.0* 5.1*  --  4.4  < > = values in this interval not displayed. GFR: Estimated Creatinine Clearance: 38.3 mL/min (by C-G formula based on SCr of 1.87 mg/dL (H)). Liver Function Tests:  Recent Labs Lab 12/13/15 1722 12/14/15 0711 12/15/15 0011 12/20/15 0214  AST 22  --  23 23  ALT 18  --  17 14  ALKPHOS 84  --  67 140*  BILITOT 2.0* 3.2* 2.4* 1.5*  PROT 5.6*  --  5.6* 7.8  ALBUMIN 1.4*  --  1.2* 1.9*  1.9*    Recent Labs Lab 12/13/15 1722  LIPASE 13   No results for input(s): AMMONIA in the last 168 hours. Coagulation Profile:  Recent Labs Lab 12/13/15 1745 12/14/15 0711 12/16/15 0958  INR 1.49 1.44 1.45   Cardiac Enzymes:  Recent Labs Lab 12/14/15 0711 12/14/15 1450  TROPONINI 0.03*  <0.03 <0.03   BNP (last 3 results) No results for input(s): PROBNP in the last 8760 hours. HbA1C: No results for input(s): HGBA1C in the last 72 hours. CBG:  Recent Labs Lab 12/14/15 0512  GLUCAP 78   Lipid Profile: No results for input(s): CHOL, HDL, LDLCALC, TRIG, CHOLHDL, LDLDIRECT in the last 72 hours. Thyroid Function Tests: No results for input(s): TSH, T4TOTAL, FREET4, T3FREE, THYROIDAB in the last 72 hours. Anemia Panel: No results for input(s): VITAMINB12, FOLATE, FERRITIN, TIBC, IRON, RETICCTPCT in the last 72 hours. Urine analysis:    Component Value Date/Time   COLORURINE AMBER (A) 12/13/2015 2031   APPEARANCEUR TURBID (A) 12/13/2015 2031   LABSPEC 1.019 12/13/2015 2031   PHURINE 6.0 12/13/2015 2031   GLUCOSEU NEGATIVE 12/13/2015 2031   HGBUR LARGE (A) 12/13/2015 2031   BILIRUBINUR SMALL (A) 12/13/2015 2031   KETONESUR NEGATIVE 12/13/2015 2031   PROTEINUR 100 (A) 12/13/2015 2031   UROBILINOGEN 1.0 03/30/2015 1049   NITRITE POSITIVE (A) 12/13/2015 2031   LEUKOCYTESUR LARGE (A) 12/13/2015 2031   Sepsis Labs: @LABRCNTIP (procalcitonin:4,lacticidven:4)  ) Recent Results (from the past 240 hour(s))   Urine culture     Status: None   Collection Time: 12/11/15  8:40 AM  Result Value Ref Range Status   Specimen Description URINE, CLEAN CATCH  Final   Special  Requests NONE  Final   Culture NO GROWTH Performed at [facility]   Final   Report Status 12/12/2015 FINAL  Final  Blood culture (routine x 2)     Status: None   Collection Time: 12/13/15  5:22 PM  Result Value Ref Range Status   Specimen Description BLOOD RIGHT ANTECUBITAL  Final   Special Requests BOTTLES DRAWN AEROBIC AND ANAEROBIC 5CC  Final   Culture NO GROWTH 5 DAYS  Final   Report Status 12/18/2015 FINAL  Final  Blood culture (routine x 2)     Status: None   Collection Time: 12/13/15  5:45 PM  Result Value Ref Range Status   Specimen Description BLOOD LEFT ANTECUBITAL  Final   Special Requests IN PEDIATRIC BOTTLE 3CC  Final   Culture NO GROWTH 5 DAYS  Final   Report Status 12/18/2015 FINAL  Final  Urine culture     Status: Abnormal   Collection Time: 12/13/15  8:31 PM  Result Value Ref Range Status   Specimen Description URINE, CLEAN CATCH  Final   Special Requests NONE  Final   Culture >=100,000 COLONIES/mL KLEBSIELLA PNEUMONIAE (A)  Final   Report Status 12/16/2015 FINAL  Final   Organism ID, Bacteria KLEBSIELLA PNEUMONIAE (A)  Final      Susceptibility   Klebsiella pneumoniae - MIC*    AMPICILLIN >=32 RESISTANT Resistant     CEFAZOLIN <=4 SENSITIVE Sensitive     CEFTRIAXONE <=1 SENSITIVE Sensitive     CIPROFLOXACIN <=0.25 SENSITIVE Sensitive     GENTAMICIN <=1 SENSITIVE Sensitive     IMIPENEM <=0.25 SENSITIVE Sensitive     NITROFURANTOIN 32 SENSITIVE Sensitive     TRIMETH/SULFA <=20 SENSITIVE Sensitive     AMPICILLIN/SULBACTAM 4 SENSITIVE Sensitive     PIP/TAZO <=4 SENSITIVE Sensitive     Extended ESBL NEGATIVE Sensitive     * >=100,000 COLONIES/mL KLEBSIELLA PNEUMONIAE  MRSA PCR Screening     Status: None   Collection Time: 12/14/15  5:11 AM  Result Value Ref Range Status   MRSA by PCR  NEGATIVE NEGATIVE Final    Comment:        The GeneXpert MRSA Assay (FDA approved for NASAL specimens only), is one component of a comprehensive MRSA colonization surveillance program. It is not intended to diagnose MRSA infection nor to guide or monitor treatment for MRSA infections.          Radiology Studies: No results found.      Scheduled Meds: . cefUROXime  500 mg Oral BID WC  . famotidine  20 mg Oral Daily  . senna-docusate  1 tablet Oral BID  . vitamin C  500 mg Oral BID   Continuous Infusions:    LOS: 6 days    Time spent: 40 minutes     Amonie Wisser, Roselind Messier, MD [facility] Pager [phone]   If 7PM-7AM, please contact night-coverage [email or web address] Password [email or web address] 12/20/2015, 11:43 AM

## 2015-12-21 MED ORDER — ONDANSETRON HCL 4 MG/2ML IJ SOLN
4.0000 mg | Freq: Four times a day (QID) | INTRAMUSCULAR | Status: DC | PRN
Start: 2015-12-21 — End: 2015-12-27
  Administered 2015-12-21 – 2015-12-23 (×2): 4 mg via INTRAVENOUS
  Filled 2015-12-21 (×2): qty 2

## 2015-12-21 MED ORDER — PROCHLORPERAZINE EDISYLATE 5 MG/ML IJ SOLN
10.0000 mg | Freq: Four times a day (QID) | INTRAMUSCULAR | Status: DC | PRN
Start: 2015-12-21 — End: 2015-12-27

## 2015-12-21 MED ORDER — CARVEDILOL 3.125 MG PO TABS
3.1250 mg | ORAL_TABLET | Freq: Every morning | ORAL | Status: DC
  Administered 2015-12-21 – 2015-12-22 (×2): 3.125 mg via ORAL
  Filled 2015-12-21 (×2): qty 1

## 2015-12-21 MED ORDER — POTASSIUM CHLORIDE CRYS ER 20 MEQ PO TBCR
40.0000 meq | EXTENDED_RELEASE_TABLET | Freq: Once | ORAL | Status: AC
  Administered 2015-12-21: 40 meq via ORAL
  Filled 2015-12-21: qty 2

## 2015-12-21 NOTE — Progress Notes (Signed)
[place] TEAM 1 - Stepdown/ICU TEAM  Carolyn Lynn  WRU:[id number] DOB: 08/16/1985 DOA: 12/13/2015 PCP: No PCP Per Patient    Brief Narrative:  30 year old F w/ Hx of childbirth 11/13/2015 who presented to the hospital with back pain, shortness of breath, fatigue, feet swelling and abdominal distention. On exam she was hypotensive and tachycardic. Patient's blood pressures were consistently in the 70s and 80s. Patient's blood work showed a Hgb of 4.9. CMP with new acute kidney injury with her creatinine of 2.2.   Subjective: The patient is up in a bedside chair.  She reports mild low back pain but overall states she feels better.  She has a poor appetite but feels that it is slowly improving.  She denies shortness of breath or chest pain.  She was noted to have a fever to 102.7 earlier this morning with no clear localizing symptoms.  Assessment & Plan:  Acute systolic CHF  -?peripartum CM - Cards has evaluated - to have f/u TTE in 2-3 months - no ACEi/ARB due to AKI - BB initiated per Cards today - not volume overloaded on exam   Filed Weights   12/18/15 0404 12/20/15 0427 12/21/15 0445  Weight: 66.2 kg (146 lb) 61.1 kg (134 lb 11.2 oz) 61.2 kg (134 lb 14.7 oz)    Hypotension / Shock  -due to hypovolemia, cardiogenic shock, and hemorrhage - BP has stabilized - follow w/ addition of BB  Acute Renal failure - Mild L Hydronephrosis -secondary to UTI and hypotensive shock - mild hydro felt to be due to hematosalpinx - crt cont to trend down at this time - follow - avoid nephrotoxins   Recent Labs Lab 12/16/15 0211 12/17/15 0248 12/18/15 0241 12/19/15 0237 12/20/15 0214  CREATININE 2.13* 2.44* 2.42* 2.08* 1.87*    Klebsiella pneumoniae UTI -plan to treat for 14 days- transitioned to oral abx - follow fever curve as fever recurred early this morning - if persists will need further w/u   Large bilateral hematosalpinx Noted on transvaginal US - GYN has evaluated    Hepatosplenomegaly Felt to likely be due to passive congestion in the setting of acute heart failure   Acute anemia / Fe Deficiency anemia S/p 5U PRBC total - Hgb stabilizing / improving - cont to follow trend   Recent Labs Lab 12/17/15 0248 12/18/15 0241 12/18/15 1040 12/19/15 0237 12/20/15 0214  HGB 7.3* 6.8* 8.3* 8.0* 9.0*    Hypokalemia -replace to goal of 4.0 or >  Hypomagnesemia -corrected   DVT prophylaxis: SCDs Code Status: FULL CODE Family Communication: Spoke with mother at bedside  Disposition Plan: stable for transfer to med bed - watch fever curve - follow lytes and renal fxn - mobilize - possible d/c home in 48hrs or so   Consultants:  [facility]  [facility]    Procedures: 10/31 CT abdomen pelvis WO contrast - enlarged hydrosalpinges (.infection vs blood) -Left kidney;suspected hydronephrosis. - Hepatosplenomegaly. - Small volume free fluid within the abdomen and pelvis with mild diffuse anasarca, suspected  Intrinsic liver disease. - Enlarged periaortic adenopathy  11/2 TTE EF 40% to 45%. Mild diffuse hypokinesis  Antimicrobials:  Zosyn 10/31 > 11/2 Vancomycin 10/31 > 11/2 Rocephin 11/3 > 11/6 Ceftin 11/7 >  Objective: Blood pressure 102/67, pulse (!) 58, temperature 97.6 F (36.4 C), temperature source Oral, resp. rate 15, height 5\' 4"  (1.626 m), weight 61.2 kg (134 lb 14.7 oz), last menstrual period 12/10/2014, SpO2 100 %, unknown if currently breastfeeding.  Intake/Output Summary (Last 24 hours) at 12/21/15 1530 Last data filed at 12/21/15 1000  Gross per 24 hour  Intake              580 ml  Output                0 ml  Net              580 ml   Filed Weights   12/18/15 0404 12/20/15 0427 12/21/15 0445  Weight: 66.2 kg (146 lb) 61.1 kg (134 lb 11.2 oz) 61.2 kg (134 lb 14.7 oz)    Examination: General: No acute respiratory distress - alert  Lungs: Clear to auscultation bilaterally  Cardiovascular:  Regular rate and rhythm without murmur  Abdomen: Mildly tender diffusely, nondistended, soft, bowel sounds positive, no rebound, no ascites, no appreciable mass Extremities: No significant cyanosis, clubbing, edema bilateral lower extremities  CBC:  Recent Labs Lab 12/15/15 0011  12/16/15 0211 12/17/15 0248 12/18/15 0241 12/18/15 1040 12/19/15 0237 12/20/15 0214  WBC 11.7*  < > 10.0 7.3 5.7  --  7.8 10.0  NEUTROABS 9.8*  --   --   --   --   --   --   --   HGB 7.2*  < > 7.8* 7.3* 6.8* 8.3* 8.0* 9.0*  HCT 21.6*  < > 23.7* 22.3* 20.9* 25.5* 24.3* 28.2*  MCV 75.0*  < > 75.5* 74.6* 74.9*  --  76.9* 77.5*  PLT 185  < > 216 210 179  --  182 225  < > = values in this interval not displayed.   Basic Metabolic Panel:  Recent Labs Lab 12/15/15 0011 12/16/15 0211 12/17/15 0248 12/18/15 0241 12/19/15 0237 12/20/15 0214  NA  --  135 134* 132* 131* 132*  K  --  3.7 2.8* 3.5 3.4* 3.7  CL  --  107 100* 96* 95* 96*  CO2  --  18* 22 25 25 23   GLUCOSE  --  81 92 94 90 91  BUN  --  24* 26* 24* 25* 26*  CREATININE  --  2.13* 2.44* 2.42* 2.08* 1.87*  CALCIUM  --  8.2* 8.3* 8.2* 8.4* 8.7*  MG 1.6* 2.2 1.7 1.9  --   --   PHOS 5.7* 5.6* 6.0* 5.1*  --  4.4   GFR: Estimated Creatinine Clearance: 38.3 mL/min (by C-G formula based on SCr of 1.87 mg/dL (H)).  Liver Function Tests:  Recent Labs Lab 12/15/15 0011 12/20/15 0214  AST 23 23  ALT 17 14  ALKPHOS 67 140*  BILITOT 2.4* 1.5*  PROT 5.6* 7.8  ALBUMIN 1.2* 1.9*  1.9*    Coagulation Profile:  Recent Labs Lab 12/16/15 0958  INR 1.45    Recent Results (from the past 240 hour(s))  Blood culture (routine x 2)     Status: None   Collection Time: 12/13/15  5:22 PM  Result Value Ref Range Status   Specimen Description BLOOD RIGHT ANTECUBITAL  Final   Special Requests BOTTLES DRAWN AEROBIC AND ANAEROBIC 5CC  Final   Culture NO GROWTH 5 DAYS  Final   Report Status 12/18/2015 FINAL  Final  Blood culture (routine x 2)      Status: None   Collection Time: 12/13/15  5:45 PM  Result Value Ref Range Status   Specimen Description BLOOD LEFT ANTECUBITAL  Final   Special Requests IN PEDIATRIC BOTTLE 3CC  Final   Culture NO GROWTH 5 DAYS  Final   Report  Status 12/18/2015 FINAL  Final  Urine culture     Status: Abnormal   Collection Time: 12/13/15  8:31 PM  Result Value Ref Range Status   Specimen Description URINE, CLEAN CATCH  Final   Special Requests NONE  Final   Culture >=100,000 COLONIES/mL KLEBSIELLA PNEUMONIAE (A)  Final   Report Status 12/16/2015 FINAL  Final   Organism ID, Bacteria KLEBSIELLA PNEUMONIAE (A)  Final      Susceptibility   Klebsiella pneumoniae - MIC*    AMPICILLIN >=32 RESISTANT Resistant     CEFAZOLIN <=4 SENSITIVE Sensitive     CEFTRIAXONE <=1 SENSITIVE Sensitive     CIPROFLOXACIN <=0.25 SENSITIVE Sensitive     GENTAMICIN <=1 SENSITIVE Sensitive     IMIPENEM <=0.25 SENSITIVE Sensitive     NITROFURANTOIN 32 SENSITIVE Sensitive     TRIMETH/SULFA <=20 SENSITIVE Sensitive     AMPICILLIN/SULBACTAM 4 SENSITIVE Sensitive     PIP/TAZO <=4 SENSITIVE Sensitive     Extended ESBL NEGATIVE Sensitive     * >=100,000 COLONIES/mL KLEBSIELLA PNEUMONIAE  MRSA PCR Screening     Status: None   Collection Time: 12/14/15  5:11 AM  Result Value Ref Range Status   MRSA by PCR NEGATIVE NEGATIVE Final    Comment:        The GeneXpert MRSA Assay (FDA approved for NASAL specimens only), is one component of a comprehensive MRSA colonization surveillance program. It is not intended to diagnose MRSA infection nor to guide or monitor treatment for MRSA infections.      Scheduled Meds: . carvedilol  3.125 mg Oral q morning - 10a  . cefUROXime  500 mg Oral BID WC  . famotidine  20 mg Oral Daily  . ferrous sulfate  325 mg Oral BID WC  . senna-docusate  1 tablet Oral BID  . vitamin C  500 mg Oral BID     LOS: 7 days   Lonia BloodJeffrey T. Kitiara Hintze, MD [facility] Office  [phone] Pager - Text  Page per Loretha StaplerAmion as per below:  On-Call/Text Page:      [email or web address]      password [email or web address]  If 7PM-7AM, please contact night-coverage [email or web address] Password [email or web address] 12/21/2015, 3:30 PM

## 2015-12-21 NOTE — Progress Notes (Signed)
Patient Name: Carolyn Lynn Date of Encounter: 12/21/2015  Primary Cardiologist:   Hospital Problem List      Principal Problem:   [facility](HCC) Active Problems:   AKI (acute kidney injury) (HCC)   Jaundice   Hepatosplenomegaly   Anemia, iron deficiency   Adnexal mass   Abnormal coagulation profile   Anemia   Sepsis (HCC)   Hydronephrosis, left   Peripartum cardiomyopathy   Acute combined systolic and diastolic heart failure (HCC)   Acute systolic CHF (congestive heart failure) (HCC)   Other hydronephrosis   Infective urethritis   Klebsiella cystitis   Acute renal failure (HCC)   Cardiomyopathy (HCC)   Hypokalemia   Klebsiella pneumoniae infection     Subjective   She is looking stronger today with a smile.  Inpatient Medications    Scheduled Meds: . cefUROXime  500 mg Oral BID WC  . famotidine  20 mg Oral Daily  . ferrous sulfate  325 mg Oral BID WC  . senna-docusate  1 tablet Oral BID  . vitamin C  500 mg Oral BID   Continuous Infusions:  PRN Meds: acetaminophen, diphenhydrAMINE, fentaNYL (SUBLIMAZE) injection, oxyCODONE   Vital Signs    Vitals:   12/21/15 0556 12/21/15 0600 12/21/15 0648 12/21/15 0812  BP:  108/67  99/63  Pulse:  91  65  Resp:  15  18  Temp: (!) 101.8 F (38.8 C)  99.6 F (37.6 C) 98.5 F (36.9 C)  TempSrc: Oral  Oral Oral  SpO2:  97%  98%  Weight:      Height:        Intake/Output Summary (Last 24 hours) at 12/21/15 0955 Last data filed at 12/20/15 1600  Gross per 24 hour  Intake              800 ml  Output                0 ml  Net              800 ml   Filed Weights   12/18/15 0404 12/20/15 0427 12/21/15 0445  Weight: 146 lb (66.2 kg) 134 lb 11.2 oz (61.1 kg) 134 lb 14.7 oz (61.2 kg)    Physical Exam   Lungs reveal a few scattered rhonchi. Cardiac exam reveals an S1 and S2. There is no peripheral edema.  Labs    CBC  Recent Labs  12/19/15 0237 12/20/15 0214  WBC 7.8 10.0  HGB 8.0* 9.0*  HCT 24.3*  28.2*  MCV 76.9* 77.5*  PLT 182 225   Basic Metabolic Panel  Recent Labs  12/19/15 0237 12/20/15 0214  NA 131* 132*  K 3.4* 3.7  CL 95* 96*  CO2 25 23  GLUCOSE 90 91  BUN 25* 26*  CREATININE 2.08* 1.87*  CALCIUM 8.4* 8.7*  PHOS  --  4.4   Liver Function Tests  Recent Labs  12/20/15 0214  AST 23  ALT 14  ALKPHOS 140*  BILITOT 1.5*  PROT 7.8  ALBUMIN 1.9*  1.9*   No results for input(s): LIPASE, AMYLASE in the last 72 hours. Cardiac Enzymes No results for input(s): CKTOTAL, CKMB, CKMBINDEX, TROPONINI in the last 72 hours. BNP Invalid input(s): POCBNP D-Dimer No results for input(s): DDIMER in the last 72 hours. Hemoglobin A1C No results for input(s): HGBA1C in the last 72 hours. Fasting Lipid Panel No results for input(s): CHOL, HDL, LDLCALC, TRIG, CHOLHDL, LDLDIRECT in the last 72 hours. Thyroid Function Tests  No results for input(s): TSH, T4TOTAL, T3FREE, THYROIDAB in the last 72 hours.  Invalid input(s): FREET3  Telemetry    ECG      Radiology    No results found.  Cardiac Studies    Patient Profile       Assessment & Plan       AKI (acute kidney injury) (HCC)     Renal function has been slowly improving. No labs today.    Acute combined systolic and diastolic heart failure (HCC)       Volume status is stable on no diuretic. No change in therapy.     Cardiomyopathy (HCC)      Etiology remains unclear. This may be peripartum cardiomyopathy. However there are multiple other stress factors. To this point we have not use an ACE inhibitor or beta blocker because of her blood pressure and renal function. I feel that she is now stable to start low-dose carvedilol.     Signed, Willa RoughJeffrey Lylie Blacklock, MD  12/21/2015, 9:55 AM

## 2015-12-22 ENCOUNTER — Inpatient Hospital Stay (HOSPITAL_COMMUNITY): Payer: Medicaid Other

## 2015-12-22 DIAGNOSIS — R791 Abnormal coagulation profile: Secondary | ICD-10-CM

## 2015-12-22 LAB — CBC
HCT: 27.7 % — ABNORMAL LOW (ref 36.0–46.0)
Hemoglobin: 8.6 g/dL — ABNORMAL LOW (ref 12.0–15.0)
MCH: 24.7 pg — ABNORMAL LOW (ref 26.0–34.0)
MCHC: 31 g/dL (ref 30.0–36.0)
MCV: 79.6 fL (ref 78.0–100.0)
Platelets: 236 10*3/uL (ref 150–400)
RBC: 3.48 MIL/uL — ABNORMAL LOW (ref 3.87–5.11)
RDW: 18.5 % — ABNORMAL HIGH (ref 11.5–15.5)
WBC: 9.2 10*3/uL (ref 4.0–10.5)

## 2015-12-22 LAB — COMPREHENSIVE METABOLIC PANEL
ALT: 14 U/L (ref 14–54)
AST: 22 U/L (ref 15–41)
Albumin: 1.8 g/dL — ABNORMAL LOW (ref 3.5–5.0)
Alkaline Phosphatase: 133 U/L — ABNORMAL HIGH (ref 38–126)
Anion gap: 7 (ref 5–15)
BUN: 18 mg/dL (ref 6–20)
CO2: 24 mmol/L (ref 22–32)
Calcium: 8.6 mg/dL — ABNORMAL LOW (ref 8.9–10.3)
Chloride: 101 mmol/L (ref 101–111)
Creatinine, Ser: 1.54 mg/dL — ABNORMAL HIGH (ref 0.44–1.00)
GFR calc Af Amer: 52 mL/min — ABNORMAL LOW (ref >60)
GFR calc non Af Amer: 45 mL/min — ABNORMAL LOW (ref >60)
Glucose, Bld: 92 mg/dL (ref 65–99)
Potassium: 4.1 mmol/L (ref 3.5–5.1)
Sodium: 132 mmol/L — ABNORMAL LOW (ref 135–145)
Total Bilirubin: 1.5 mg/dL — ABNORMAL HIGH (ref 0.3–1.2)
Total Protein: 6.8 g/dL (ref 6.5–8.1)

## 2015-12-22 MED ORDER — ENSURE ENLIVE PO LIQD
237.0000 mL | ORAL | Status: DC
  Administered 2015-12-22 – 2015-12-26 (×4): 237 mL via ORAL

## 2015-12-22 MED ORDER — CARVEDILOL 3.125 MG PO TABS
3.1250 mg | ORAL_TABLET | Freq: Two times a day (BID) | ORAL | Status: DC
  Administered 2015-12-22 – 2015-12-25 (×7): 3.125 mg via ORAL
  Filled 2015-12-22 (×8): qty 1

## 2015-12-22 NOTE — Progress Notes (Signed)
Patient Name: Carolyn Lynn Date of Encounter: 12/22/2015  Primary Cardiologist:   Hospital Problem List     Principal Problem:   [facility](HCC) Active Problems:   AKI (acute kidney injury) (HCC)   Jaundice   Hepatosplenomegaly   Anemia, iron deficiency   Adnexal mass   Abnormal coagulation profile   Anemia   Sepsis (HCC)   Hydronephrosis, left   Peripartum cardiomyopathy   Acute combined systolic and diastolic heart failure (HCC)   Acute systolic CHF (congestive heart failure) (HCC)   Other hydronephrosis   Infective urethritis   Klebsiella cystitis   Acute renal failure (HCC)   Cardiomyopathy (HCC)   Hypokalemia   Klebsiella pneumoniae infection     Subjective     Inpatient Medications    Scheduled Meds: . carvedilol  3.125 mg Oral q morning - 10a  . cefUROXime  500 mg Oral BID WC  . famotidine  20 mg Oral Daily  . ferrous sulfate  325 mg Oral BID WC  . senna-docusate  1 tablet Oral BID  . vitamin C  500 mg Oral BID   Continuous Infusions:  PRN Meds: acetaminophen, diphenhydrAMINE, fentaNYL (SUBLIMAZE) injection, ondansetron (ZOFRAN) IV, oxyCODONE, prochlorperazine   Vital Signs    Vitals:   12/21/15 2124 12/22/15 0015 12/22/15 0620 12/22/15 0859  BP: 106/73  102/64 111/71  Pulse: 81  63 74  Resp: 13  15   Temp: (!) 101.8 F (38.8 C) 98.5 F (36.9 C) 99.7 F (37.6 C)   TempSrc: Oral Oral Oral   SpO2: 98%  94%   Weight:   133 lb 9.6 oz (60.6 kg)   Height:        Intake/Output Summary (Last 24 hours) at 12/22/15 0905 Last data filed at 12/21/15 1759  Gross per 24 hour  Intake              960 ml  Output                0 ml  Net              960 ml   Filed Weights   12/20/15 0427 12/21/15 0445 12/22/15 0620  Weight: 134 lb 11.2 oz (61.1 kg) 134 lb 14.7 oz (61.2 kg) 133 lb 9.6 oz (60.6 kg)    Physical Exam    Labs    CBC  Recent Labs  12/20/15 0214 12/22/15 0345  WBC 10.0 9.2  HGB 9.0* 8.6*  HCT 28.2* 27.7*  MCV 77.5* 79.6   PLT 225 236   Basic Metabolic Panel  Recent Labs  12/20/15 0214 12/22/15 0345  NA 132* 132*  K 3.7 4.1  CL 96* 101  CO2 23 24  GLUCOSE 91 92  BUN 26* 18  CREATININE 1.87* 1.54*  CALCIUM 8.7* 8.6*  PHOS 4.4  --    Liver Function Tests  Recent Labs  12/20/15 0214 12/22/15 0345  AST 23 22  ALT 14 14  ALKPHOS 140* 133*  BILITOT 1.5* 1.5*  PROT 7.8 6.8  ALBUMIN 1.9*  1.9* 1.8*   No results for input(s): LIPASE, AMYLASE in the last 72 hours. Cardiac Enzymes No results for input(s): CKTOTAL, CKMB, CKMBINDEX, TROPONINI in the last 72 hours. BNP Invalid input(s): POCBNP D-Dimer No results for input(s): DDIMER in the last 72 hours. Hemoglobin A1C No results for input(s): HGBA1C in the last 72 hours. Fasting Lipid Panel No results for input(s): CHOL, HDL, LDLCALC, TRIG, CHOLHDL, LDLDIRECT in the last 72  hours. Thyroid Function Tests No results for input(s): TSH, T4TOTAL, T3FREE, THYROIDAB in the last 72 hours.  Invalid input(s): FREET3  Telemetry      ECG      Radiology    No results found.  Cardiac Studies     Patient Profile     Patient has left ventricular dysfunction. This was diagnosed peripartum. She also had severe anemia.  Asses   AKI (acute kidney injury) (HCC)     Renal function has been slowly improving. No labs today. Renal function continues to improve    Acute combined systolic and diastolic heart failure (HCC)       Volume status is stable on no diuretic. No change in therapy.     Cardiomyopathy (HCC)      Etiology remains unclear. This may be peripartum cardiomyopathy. However there are multiple other stress factors. To this point we have not use an ACE inhibitor or beta blocker because of her blood pressure and renal function. She is tolerating low-dose carvedilol. I will increase her dose to 3.125 mg twice a day.         Signed, Willa RoughJeffrey Rumaldo Difatta, MD  12/22/2015, 9:05 AM

## 2015-12-22 NOTE — Progress Notes (Signed)
[place] TEAM 1 - Stepdown/ICU TEAM  Rosita KeaJervita J Decola  ZOX:[id number] DOB: 10/21/1985 DOA: 12/13/2015 PCP: No PCP Per Patient    Brief Narrative:  30 year old F w/ Hx of childbirth 11/13/2015 who presented to the hospital with back pain, shortness of breath, fatigue, feet swelling and abdominal distention. On exam she was hypotensive and tachycardic. Patient's blood pressures were consistently in the 70s and 80s. Patient's blood work showed a Hgb of 4.9. CMP with new acute kidney injury with her creatinine of 2.2.   Subjective:  She was noted to have a fever to 101.8 last night.  Assessment & Plan: Acute systolic CHF -unclear etiology -?peripartum CM - Cards has evaluated - to have f/u TTE in 2-3 months - no ACEi/ARB due to AKI - BB initiated per Cards today - not volume overloaded on exam . Continue Coreg  Filed Weights   12/20/15 0427 12/21/15 0445 12/22/15 0620  Weight: 61.1 kg (134 lb 11.2 oz) 61.2 kg (134 lb 14.7 oz) 60.6 kg (133 lb 9.6 oz)    Hypotension / Shock  -due to hypovolemia, cardiogenic shock, and hemorrhage - BP has stabilized - Cardiology added Coreg  Acute Renal failure - Mild L Hydronephrosis -secondary to UTI and hypotensive shock - mild hydro felt to be due to hematosalpinx - crt cont to trend down at this time - follow - avoid nephrotoxins   Recent Labs Lab 12/17/15 0248 12/18/15 0241 12/19/15 0237 12/20/15 0214 12/22/15 0345  CREATININE 2.44* 2.42* 2.08* 1.87* 1.54*    Klebsiella pneumoniae UTI -plan to treat for 14 days- transitioned to oral abx - follow fever curve as fever recurred early this morning -Continues to have fever we'll repeat chest x-ray  Large bilateral hematosalpinx Noted on transvaginal US - GYN has evaluated   Hepatosplenomegaly Felt to likely be due to passive congestion in the setting of acute heart failure   Acute anemia / Fe Deficiency anemia S/p 5U PRBC total - Hgb stabilizing / improving - cont to follow trend    Recent Labs Lab 12/18/15 0241 12/18/15 1040 12/19/15 0237 12/20/15 0214 12/22/15 0345  HGB 6.8* 8.3* 8.0* 9.0* 8.6*    Hypokalemia -replace to goal of 4.0 or >  Hypomagnesemia -corrected   DVT prophylaxis: SCDs Code Status: FULL CODE Family Communication: Spoke with mother at bedside  Disposition Plan:   d/c home in 48hrs or so   [facility]  [facility]  [facility]    Procedures: 10/31 CT abdomen pelvis WO contrast - enlarged hydrosalpinges (.infection vs blood) -Left kidney;suspected hydronephrosis. - Hepatosplenomegaly. - Small volume free fluid within the abdomen and pelvis with mild diffuse anasarca, suspected  Intrinsic liver disease. - Enlarged periaortic adenopathy  11/2 TTE EF 40% to 45%. Mild diffuse hypokinesis  Antimicrobials:  Zosyn 10/31 > 11/2 Vancomycin 10/31 > 11/2 Rocephin 11/3 > 11/6 Ceftin 11/7 >  Objective: Blood pressure 111/71, pulse 75, temperature 99.7 F (37.6 C), temperature source Oral, resp. rate 18, height 5\' 4"  (1.626 m), weight 60.6 kg (133 lb 9.6 oz), last menstrual period 12/10/2014, SpO2 94 %, unknown if currently breastfeeding.  Intake/Output Summary (Last 24 hours) at 12/22/15 1134 Last data filed at 12/21/15 1759  Gross per 24 hour  Intake              600 ml  Output                0 ml  Net  600 ml   Filed Weights   12/20/15 0427 12/21/15 0445 12/22/15 0620  Weight: 61.1 kg (134 lb 11.2 oz) 61.2 kg (134 lb 14.7 oz) 60.6 kg (133 lb 9.6 oz)    Examination: General: No acute respiratory distress - alert  Lungs: Clear to auscultation bilaterally  Cardiovascular: Regular rate and rhythm without murmur  Abdomen: Mildly tender diffusely, nondistended, soft, bowel sounds positive, no rebound, no ascites, no appreciable mass Extremities: No significant cyanosis, clubbing, edema bilateral lower extremities  CBC:  Recent Labs Lab 12/17/15 0248 12/18/15 0241 12/18/15 1040  12/19/15 0237 12/20/15 0214 12/22/15 0345  WBC 7.3 5.7  --  7.8 10.0 9.2  HGB 7.3* 6.8* 8.3* 8.0* 9.0* 8.6*  HCT 22.3* 20.9* 25.5* 24.3* 28.2* 27.7*  MCV 74.6* 74.9*  --  76.9* 77.5* 79.6  PLT 210 179  --  182 225 236     Basic Metabolic Panel:  Recent Labs Lab 12/16/15 0211 12/17/15 0248 12/18/15 0241 12/19/15 0237 12/20/15 0214 12/22/15 0345  NA 135 134* 132* 131* 132* 132*  K 3.7 2.8* 3.5 3.4* 3.7 4.1  CL 107 100* 96* 95* 96* 101  CO2 18* 22 25 25 23 24   GLUCOSE 81 92 94 90 91 92  BUN 24* 26* 24* 25* 26* 18  CREATININE 2.13* 2.44* 2.42* 2.08* 1.87* 1.54*  CALCIUM 8.2* 8.3* 8.2* 8.4* 8.7* 8.6*  MG 2.2 1.7 1.9  --   --   --   PHOS 5.6* 6.0* 5.1*  --  4.4  --    GFR: Estimated Creatinine Clearance: 46.5 mL/min (by C-G formula based on SCr of 1.54 mg/dL (H)).  Liver Function Tests:  Recent Labs Lab 12/20/15 0214 12/22/15 0345  AST 23 22  ALT 14 14  ALKPHOS 140* 133*  BILITOT 1.5* 1.5*  PROT 7.8 6.8  ALBUMIN 1.9*  1.9* 1.8*    Coagulation Profile:  Recent Labs Lab 12/16/15 0958  INR 1.45    Recent Results (from the past 240 hour(s))  Blood culture (routine x 2)     Status: None   Collection Time: 12/13/15  5:22 PM  Result Value Ref Range Status   Specimen Description BLOOD RIGHT ANTECUBITAL  Final   Special Requests BOTTLES DRAWN AEROBIC AND ANAEROBIC 5CC  Final   Culture NO GROWTH 5 DAYS  Final   Report Status 12/18/2015 FINAL  Final  Blood culture (routine x 2)     Status: None   Collection Time: 12/13/15  5:45 PM  Result Value Ref Range Status   Specimen Description BLOOD LEFT ANTECUBITAL  Final   Special Requests IN PEDIATRIC BOTTLE 3CC  Final   Culture NO GROWTH 5 DAYS  Final   Report Status 12/18/2015 FINAL  Final  Urine culture     Status: Abnormal   Collection Time: 12/13/15  8:31 PM  Result Value Ref Range Status   Specimen Description URINE, CLEAN CATCH  Final   Special Requests NONE  Final   Culture >=100,000 COLONIES/mL  KLEBSIELLA PNEUMONIAE (A)  Final   Report Status 12/16/2015 FINAL  Final   Organism ID, Bacteria KLEBSIELLA PNEUMONIAE (A)  Final      Susceptibility   Klebsiella pneumoniae - MIC*    AMPICILLIN >=32 RESISTANT Resistant     CEFAZOLIN <=4 SENSITIVE Sensitive     CEFTRIAXONE <=1 SENSITIVE Sensitive     CIPROFLOXACIN <=0.25 SENSITIVE Sensitive     GENTAMICIN <=1 SENSITIVE Sensitive     IMIPENEM <=0.25 SENSITIVE Sensitive  NITROFURANTOIN 32 SENSITIVE Sensitive     TRIMETH/SULFA <=20 SENSITIVE Sensitive     AMPICILLIN/SULBACTAM 4 SENSITIVE Sensitive     PIP/TAZO <=4 SENSITIVE Sensitive     Extended ESBL NEGATIVE Sensitive     * >=100,000 COLONIES/mL KLEBSIELLA PNEUMONIAE  MRSA PCR Screening     Status: None   Collection Time: 12/14/15  5:11 AM  Result Value Ref Range Status   MRSA by PCR NEGATIVE NEGATIVE Final    Comment:        The GeneXpert MRSA Assay (FDA approved for NASAL specimens only), is one component of a comprehensive MRSA colonization surveillance program. It is not intended to diagnose MRSA infection nor to guide or monitor treatment for MRSA infections.      Scheduled Meds: . carvedilol  3.125 mg Oral BID WC  . cefUROXime  500 mg Oral BID WC  . famotidine  20 mg Oral Daily  . ferrous sulfate  325 mg Oral BID WC  . senna-docusate  1 tablet Oral BID  . vitamin C  500 mg Oral BID     LOS: 8 days     [facility] Office  [phone] Pager - Text Page per Loretha StaplerAmion as per below:  On-Call/Text Page:      [email or web address]      password [email or web address]  If 7PM-7AM, please contact night-coverage [email or web address] Password [email or web address] 12/22/2015, 11:34 AM

## 2015-12-22 NOTE — Progress Notes (Signed)
Initial Nutrition Assessment  DOCUMENTATION CODES:   Not applicable  INTERVENTION:   - Provide Ensure Enlive oral nutrition supplement daily. Each provides 350 kcal and 20 grams protein. - Encourage PO intake.  NUTRITION DIAGNOSIS:   Inadequate oral intake related to poor appetite, lethargy/confusion as evidenced by per patient/family report.  GOAL:   Patient will meet greater than or equal to 90% of their needs  MONITOR:   Supplement acceptance, PO intake, Weight trends, Labs, I & O's  REASON FOR ASSESSMENT:   Malnutrition Screening Tool   ASSESSMENT:   30 year old female one month out from a normal pregnancy and delivery. Now being admitted for septic shock. Pt with AKI. UTI on UA with pyelonephritis and hydronephrosis. Also anemia with hemoglobin 4.9.  Spoke with pt at bedside who reports having a good appetite that included meals and snacks prior to delivering her baby on 11/13/15. Since the delivery, pt has been eating 1 meal per day. Pt states poor PO is related to feeling lethargic and having an overall poor appetite.  Per chart, pt has been consuming 45-100% of meals in past three days. Discussed with pt who reports family has been brining in food from outside including fruit. Encouraged pt to consume meals and snacks as it will aid in the healing process. Pt agreeable to trying Ensure Enlive oral nutrition supplement. Will order one daily.  Pt reports that her UBW prior to pregnancy as 120#. Pt states she gained weight shortly after delivery but that it was related to fluids. Pt states she took a prenatal vitamin during pregnancy.  Medications reviewed and include 20 mg Pepcid daily, 325 mg ferrous sulfate BID, Senokot daily, 500 mg ascorbic acid BID, PRN Zofran, PRN Compazine  Labs reviewed and include low sodium (132 mmol/L), elevated creatinine (1.54 mg/dL), low calcium (8.6 mg/dL), low hemoglobin (8.6 g/dL)  NFPE: Exam completed. No fat depletion, mild muscle  depletion, and moderate edema noted.  Diet Order:  Diet Heart Room service appropriate? Yes; Fluid consistency: Thin  Skin:  Reviewed, no issues  Last BM:  12/21/15  Height:   Ht Readings from Last 1 Encounters:  12/14/15 5\' 4"  (1.626 m)    Weight:   Wt Readings from Last 1 Encounters:  12/22/15 133 lb 9.6 oz (60.6 kg)    Ideal Body Weight:  54.5 kg  BMI:  Body mass index is 22.93 kg/m.  Estimated Nutritional Needs:   Kcal:  1600-1800 (27-30 kcal/kg)  Protein:  80-95 grams (1.3-1.5 g/kg)  Fluid:  1.6-1.8 L/day  EDUCATION NEEDS:   No education needs identified at this time  Rosemarie AxKate Idaly Verret Dietetic Intern Pager Number: [phone]

## 2015-12-23 ENCOUNTER — Inpatient Hospital Stay (HOSPITAL_COMMUNITY): Payer: Medicaid Other

## 2015-12-23 ENCOUNTER — Other Ambulatory Visit (HOSPITAL_COMMUNITY): Payer: Medicaid Other

## 2015-12-23 LAB — CBC
HEMATOCRIT: 28.1 % — AB (ref 36.0–46.0)
Hemoglobin: 8.8 g/dL — ABNORMAL LOW (ref 12.0–15.0)
MCH: 24.9 pg — ABNORMAL LOW (ref 26.0–34.0)
MCHC: 31.3 g/dL (ref 30.0–36.0)
MCV: 79.4 fL (ref 78.0–100.0)
PLATELETS: 267 10*3/uL (ref 150–400)
RBC: 3.54 MIL/uL — AB (ref 3.87–5.11)
RDW: 18.7 % — ABNORMAL HIGH (ref 11.5–15.5)
WBC: 8.1 10*3/uL (ref 4.0–10.5)

## 2015-12-23 LAB — COMPREHENSIVE METABOLIC PANEL
ALBUMIN: 2 g/dL — AB (ref 3.5–5.0)
ALT: 13 U/L — AB (ref 14–54)
AST: 20 U/L (ref 15–41)
Alkaline Phosphatase: 141 U/L — ABNORMAL HIGH (ref 38–126)
Anion gap: 7 (ref 5–15)
BILIRUBIN TOTAL: 1.2 mg/dL (ref 0.3–1.2)
BUN: 17 mg/dL (ref 6–20)
CHLORIDE: 103 mmol/L (ref 101–111)
CO2: 24 mmol/L (ref 22–32)
CREATININE: 1.42 mg/dL — AB (ref 0.44–1.00)
Calcium: 8.8 mg/dL — ABNORMAL LOW (ref 8.9–10.3)
GFR calc Af Amer: 57 mL/min — ABNORMAL LOW (ref >60)
GFR, EST NON AFRICAN AMERICAN: 49 mL/min — AB (ref >60)
GLUCOSE: 91 mg/dL (ref 65–99)
Potassium: 4.2 mmol/L (ref 3.5–5.1)
Sodium: 134 mmol/L — ABNORMAL LOW (ref 135–145)
Total Protein: 7.2 g/dL (ref 6.5–8.1)

## 2015-12-23 MED ORDER — CEFUROXIME AXETIL 500 MG PO TABS: 500.0000 mg | ORAL_TABLET | Freq: Two times a day (BID) | ORAL | 0 refills | Status: DC

## 2015-12-23 MED ORDER — CARVEDILOL 3.125 MG PO TABS: 3.1250 mg | ORAL_TABLET | Freq: Two times a day (BID) | ORAL | 1 refills | Status: DC

## 2015-12-23 MED ORDER — FAMOTIDINE 20 MG PO TABS: 20.0000 mg | ORAL_TABLET | Freq: Every day | ORAL | 1 refills | Status: DC

## 2015-12-23 NOTE — Progress Notes (Addendum)
TEAM 1 - Stepdown/ICU TEAM  Carolyn Lynn  MVH:[id number] DOB: 06/12/1985 DOA: 12/13/2015 PCP: No PCP Per Patient    Brief Narrative:  30 year old F w/ Hx of childbirth 11/13/2015 who presented to the hospital with back pain, shortness of breath, fatigue, feet swelling and abdominal distention. On exam she was hypotensive and tachycardic. Patient's blood pressures were consistently in the 70s and 80s. Patient's blood work showed a Hgb of 4.9. CMP with new acute kidney injury with her creatinine of 2.2.   Subjective: Patient continues to have fever also complaining of left flank pain  Assessment & Plan: Acute systolic CHF -unclear etiology -?peripartum CM - Cards has evaluated - to have f/u TTE in 2-3 months - no ACEi/ARB due to AKI - BB initiated per Cards today - not volume overloaded on exam . Continue Coreg  Filed Weights   12/21/15 0445 12/22/15 0620 12/23/15 0522  Weight: 61.2 kg (134 lb 14.7 oz) 60.6 kg (133 lb 9.6 oz) 59.7 kg (131 lb 11.2 oz)    Persistent fever Likely source is urine, CT scan on 10/31 showed bilateral hydrosalpinges Mild left hydronephrosis could be secondary to enlarged hydrosalpinges Patient has had a positive urine culture. Klebsiella pneumonia and has fever despite being on cefuroxime Repeat blood cultures, MRI of the lumbar spine to rule out discitis, renal ultrasound to rule out worsening hydronephrosis  Hypotension / septic Shock  -due to hypovolemia, UTI, cardiogenic shock, and hemorrhage - BP has stabilized - Cardiology added Coreg  Acute Renal failure - Mild L Hydronephrosis -secondary to UTI and hypotensive shock - mild hydro felt to be due to hematosalpinx - crt cont to trend down at this time - follow - avoid nephrotoxins   Recent Labs Lab 12/18/15 0241 12/19/15 0237 12/20/15 0214 12/22/15 0345 12/23/15 0429  CREATININE 2.42* 2.08* 1.87* 1.54* 1.42*    Klebsiella pneumoniae UTI/HCAP? -plan to treat for 14 days-  transitioned to oral abx - follow fever curve as fever recurred early this morning Chest x-ray also concerning for pneumonia, however patient has no pulmonary symptoms  Large bilateral hematosalpinx Noted on transvaginal US - GYN has evaluated   Hepatosplenomegaly Felt to likely be due to passive congestion in the setting of acute heart failure   Acute anemia / Fe Deficiency anemia S/p 5U PRBC total - Hgb stabilizing / improving - cont to follow trend   Recent Labs Lab 12/18/15 1040 12/19/15 0237 12/20/15 0214 12/22/15 0345 12/23/15 0429  HGB 8.3* 8.0* 9.0* 8.6* 8.8*    Hypokalemia -replace to goal of 4.0 or >  Hypomagnesemia -corrected   DVT prophylaxis: SCDs Code Status: FULL CODE Family Communication: Spoke with mother at bedside  Disposition Plan: Not Stable for discharge  [facility]  [facility]    Procedures: 10/31 CT abdomen pelvis WO contrast - enlarged hydrosalpinges (.infection vs blood) -Left kidney;suspected hydronephrosis. - Hepatosplenomegaly. - Small volume free fluid within the abdomen and pelvis with mild diffuse anasarca, suspected  Intrinsic liver disease. - Enlarged periaortic adenopathy  11/2 TTE EF 40% to 45%. Mild diffuse hypokinesis  Antimicrobials:  Zosyn 10/31 > 11/2 Vancomycin 10/31 > 11/2 Rocephin 11/3 > 11/6 Ceftin 11/7 >  Objective: Blood pressure 106/61, pulse 63, temperature 98.7 F (37.1 C), temperature source Oral, resp. rate 13, height 5\' 4"  (1.626 m), weight 59.7 kg (131 lb 11.2 oz), last menstrual period 12/22/2015, SpO2 99 %, unknown if currently breastfeeding.  Intake/Output Summary (Last 24 hours) at 12/23/15  1130 Last data filed at 12/23/15 1000  Gross per 24 hour  Intake              360 ml  Output                0 ml  Net              360 ml   Filed Weights   12/21/15 0445 12/22/15 0620 12/23/15 0522  Weight: 61.2 kg (134 lb 14.7 oz) 60.6 kg (133 lb 9.6 oz) 59.7 kg (131  lb 11.2 oz)    Examination: General: No acute respiratory distress - alert  Lungs: Clear to auscultation bilaterally  Cardiovascular: Regular rate and rhythm without murmur  Abdomen: Mildly tender diffusely, nondistended, soft, bowel sounds positive, no rebound, no ascites, no appreciable mass Extremities: No significant cyanosis, clubbing, edema bilateral lower extremities  CBC:  Recent Labs Lab 12/18/15 0241 12/18/15 1040 12/19/15 0237 12/20/15 0214 12/22/15 0345 12/23/15 0429  WBC 5.7  --  7.8 10.0 9.2 8.1  HGB 6.8* 8.3* 8.0* 9.0* 8.6* 8.8*  HCT 20.9* 25.5* 24.3* 28.2* 27.7* 28.1*  MCV 74.9*  --  76.9* 77.5* 79.6 79.4  PLT 179  --  182 225 236 267     Basic Metabolic Panel:  Recent Labs Lab 12/17/15 0248 12/18/15 0241 12/19/15 0237 12/20/15 0214 12/22/15 0345 12/23/15 0429  NA 134* 132* 131* 132* 132* 134*  K 2.8* 3.5 3.4* 3.7 4.1 4.2  CL 100* 96* 95* 96* 101 103  CO2 22 25 25 23 24 24   GLUCOSE 92 94 90 91 92 91  BUN 26* 24* 25* 26* 18 17  CREATININE 2.44* 2.42* 2.08* 1.87* 1.54* 1.42*  CALCIUM 8.3* 8.2* 8.4* 8.7* 8.6* 8.8*  MG 1.7 1.9  --   --   --   --   PHOS 6.0* 5.1*  --  4.4  --   --    GFR: Estimated Creatinine Clearance: 50.5 mL/min (by C-G formula based on SCr of 1.42 mg/dL (H)).  Liver Function Tests:  Recent Labs Lab 12/20/15 0214 12/22/15 0345 12/23/15 0429  AST 23 22 20   ALT 14 14 13*  ALKPHOS 140* 133* 141*  BILITOT 1.5* 1.5* 1.2  PROT 7.8 6.8 7.2  ALBUMIN 1.9*  1.9* 1.8* 2.0*    Coagulation Profile: No results for input(s): INR, PROTIME in the last 168 hours.  Recent Results (from the past 240 hour(s))  Blood culture (routine x 2)     Status: None   Collection Time: 12/13/15  5:22 PM  Result Value Ref Range Status   Specimen Description BLOOD RIGHT ANTECUBITAL  Final   Special Requests BOTTLES DRAWN AEROBIC AND ANAEROBIC 5CC  Final   Culture NO GROWTH 5 DAYS  Final   Report Status 12/18/2015 FINAL  Final  Blood culture  (routine x 2)     Status: None   Collection Time: 12/13/15  5:45 PM  Result Value Ref Range Status   Specimen Description BLOOD LEFT ANTECUBITAL  Final   Special Requests IN PEDIATRIC BOTTLE 3CC  Final   Culture NO GROWTH 5 DAYS  Final   Report Status 12/18/2015 FINAL  Final  Urine culture     Status: Abnormal   Collection Time: 12/13/15  8:31 PM  Result Value Ref Range Status   Specimen Description URINE, CLEAN CATCH  Final   Special Requests NONE  Final   Culture >=100,000 COLONIES/mL KLEBSIELLA PNEUMONIAE (A)  Final   Report Status 12/16/2015 FINAL  Final   Organism ID, Bacteria KLEBSIELLA PNEUMONIAE (A)  Final      Susceptibility   Klebsiella pneumoniae - MIC*    AMPICILLIN >=32 RESISTANT Resistant     CEFAZOLIN <=4 SENSITIVE Sensitive     CEFTRIAXONE <=1 SENSITIVE Sensitive     CIPROFLOXACIN <=0.25 SENSITIVE Sensitive     GENTAMICIN <=1 SENSITIVE Sensitive     IMIPENEM <=0.25 SENSITIVE Sensitive     NITROFURANTOIN 32 SENSITIVE Sensitive     TRIMETH/SULFA <=20 SENSITIVE Sensitive     AMPICILLIN/SULBACTAM 4 SENSITIVE Sensitive     PIP/TAZO <=4 SENSITIVE Sensitive     Extended ESBL NEGATIVE Sensitive     * >=100,000 COLONIES/mL KLEBSIELLA PNEUMONIAE  MRSA PCR Screening     Status: None   Collection Time: 12/14/15  5:11 AM  Result Value Ref Range Status   MRSA by PCR NEGATIVE NEGATIVE Final    Comment:        The GeneXpert MRSA Assay (FDA approved for NASAL specimens only), is one component of a comprehensive MRSA colonization surveillance program. It is not intended to diagnose MRSA infection nor to guide or monitor treatment for MRSA infections.      Scheduled Meds: . carvedilol  3.125 mg Oral BID WC  . cefUROXime  500 mg Oral BID WC  . famotidine  20 mg Oral Daily  . feeding supplement (ENSURE ENLIVE)  237 mL Oral Q24H  . ferrous sulfate  325 mg Oral BID WC  . senna-docusate  1 tablet Oral BID  . vitamin C  500 mg Oral BID     LOS: 9 days     [facility] Office  [phone] Pager - Text Page per Loretha StaplerAmion as per below:  On-Call/Text Page:      [email or web address]      password [email or web address]  If 7PM-7AM, please contact night-coverage [email or web address] Password [email or web address] 12/23/2015, 11:30 AM

## 2015-12-23 NOTE — Plan of Care (Signed)
Problem: Pain Managment: Goal: General experience of comfort will improve Outcome: Progressing Pt with pain in lower back rated 7/10. Pt very weak and barely able to stand because of pain. 10mg  of Oxycodone given with relief for a short while.   Problem: Physical Regulation: Goal: Will remain free from infection Outcome: Progressing Pt febrile in the beginning of shift with a temp of 100.3. Tylenol administered. Temp as of this morning is 98.7.

## 2015-12-23 NOTE — Progress Notes (Signed)
Patient Name: Rosita KeaJervita J Scroggin Date of Encounter: 12/23/2015  Primary Cardiologist:   Hospital Problem List     Principal Problem:   [facility]ock [facility](HCC) Active Problems:   AKI (acute kidney injury) (HCC)   Jaundice   Hepatosplenomegaly   Anemia, iron deficiency   Adnexal mass   Abnormal coagulation profile   Anemia   Sepsis (HCC)   Hydronephrosis, left   Peripartum cardiomyopathy   Acute combined systolic and diastolic heart failure (HCC)   Acute systolic CHF (congestive heart failure) (HCC)   Other hydronephrosis   Infective urethritis   Klebsiella cystitis   Acute renal failure (HCC)   Cardiomyopathy (HCC)   Hypokalemia   Klebsiella pneumoniae infection     Subjective   The patient is stable today.  Inpatient Medications    Scheduled Meds: . carvedilol  3.125 mg Oral BID WC  . cefUROXime  500 mg Oral BID WC  . famotidine  20 mg Oral Daily  . feeding supplement (ENSURE ENLIVE)  237 mL Oral Q24H  . ferrous sulfate  325 mg Oral BID WC  . senna-docusate  1 tablet Oral BID  . vitamin C  500 mg Oral BID   Continuous Infusions:  PRN Meds: acetaminophen, diphenhydrAMINE, fentaNYL (SUBLIMAZE) injection, ondansetron (ZOFRAN) IV, oxyCODONE, prochlorperazine   Vital Signs    Vitals:   12/22/15 1658 12/22/15 2040 12/23/15 0522 12/23/15 0840  BP: 99/61 99/62 103/73 106/61  Pulse: 67 72 (!) 52 63  Resp:  11 13   Temp:  100.3 F (37.9 C) 98.7 F (37.1 C)   TempSrc:  Oral Oral   SpO2:  100% 99%   Weight:   131 lb 11.2 oz (59.7 kg)   Height:        Intake/Output Summary (Last 24 hours) at 12/23/15 1107 Last data filed at 12/23/15 1000  Gross per 24 hour  Intake              360 ml  Output                0 ml  Net              360 ml   Filed Weights   12/21/15 0445 12/22/15 0620 12/23/15 0522  Weight: 134 lb 14.7 oz (61.2 kg) 133 lb 9.6 oz (60.6 kg) 131 lb 11.2 oz (59.7 kg)    Physical Exam   Labs    CBC  Recent Labs  12/22/15 0345 12/23/15 0429    WBC 9.2 8.1  HGB 8.6* 8.8*  HCT 27.7* 28.1*  MCV 79.6 79.4  PLT 236 267   Basic Metabolic Panel  Recent Labs  12/22/15 0345 12/23/15 0429  NA 132* 134*  K 4.1 4.2  CL 101 103  CO2 24 24  GLUCOSE 92 91  BUN 18 17  CREATININE 1.54* 1.42*  CALCIUM 8.6* 8.8*   Liver Function Tests  Recent Labs  12/22/15 0345 12/23/15 0429  AST 22 20  ALT 14 13*  ALKPHOS 133* 141*  BILITOT 1.5* 1.2  PROT 6.8 7.2  ALBUMIN 1.8* 2.0*   No results for input(s): LIPASE, AMYLASE in the last 72 hours. Cardiac Enzymes No results for input(s): CKTOTAL, CKMB, CKMBINDEX, TROPONINI in the last 72 hours. BNP Invalid input(s): POCBNP D-Dimer No results for input(s): DDIMER in the last 72 hours. Hemoglobin A1C No results for input(s): HGBA1C in the last 72 hours. Fasting Lipid Panel No results for input(s): CHOL, HDL, LDLCALC, TRIG, CHOLHDL, LDLDIRECT in the  last 72 hours. Thyroid Function Tests No results for input(s): TSH, T4TOTAL, T3FREE, THYROIDAB in the last 72 hours.  Invalid input(s): FREET3  Telemetry     - Personally Reviewed     There is sinus rhythm was sinus bradycardia.  ECG    Radiology    Dg Chest 2 View  Result Date: 12/22/2015 CLINICAL DATA:  Fever for 9 days. EXAM: CHEST  2 VIEW COMPARISON:  12/16/2015 FINDINGS: There is persistent opacity of both lung bases. Appearance is more disc wide in may represent atelectasis more than infiltrate at this time. There has been some improvement in aeration. Small bilateral pleural effusions are noted. IMPRESSION: Some improvement in aeration. Persistent bibasilar opacity consistent with atelectasis or resolving infiltrates. Small residual effusions. Electronically Signed   By: Norva PavlovElizabeth  Brown M.D.   On: 12/22/2015 13:56    Cardiac Studies     Patient Profile       Assessment & Plan      AKI (acute kidney injury) (HCC)    Creatinine continues to improve to 1.4    Acute combined systolic and diastolic heart failure  (HCC)     Volume status is stable. We have not use an ACE inhibitor because of her renal dysfunction. I started low dose carvedilol. She has borderline bradycardia. The dose will be To 3.125 mg twice a day.    Cardiomyopathy (HCC)     Etiology remains unclear. It may be a combination of the fact that she is peripartum, that she had marked anemia, and she had an episode of shock. She is on low-dose beta-blockade. ACE inhibitor is not being used at this time because of renal function. Low-dose beta-blockade is being used. She needs post hospital follow-up with cardiology to follow her left ventricular function. Decisions about whether she can become pregnant again will have to be made very carefully. At this point we can either prove or disprove that her LV dysfunction was caused as a postpartum problem.  She is stable from the cardiac viewpoint. When her other medical problems are stabilized she can be discharged home with early post hospital cardiology follow-up   Willa RoughJeffrey Ermagene Saidi, MD  12/23/2015, 11:07 AM

## 2015-12-24 LAB — URINALYSIS, ROUTINE W REFLEX MICROSCOPIC
BILIRUBIN URINE: NEGATIVE
Glucose, UA: NEGATIVE mg/dL
Ketones, ur: NEGATIVE mg/dL
Nitrite: NEGATIVE
PH: 6 (ref 5.0–8.0)
Protein, ur: 30 mg/dL — AB
SPECIFIC GRAVITY, URINE: 1.015 (ref 1.005–1.030)

## 2015-12-24 LAB — BLOOD CULTURE ID PANEL (REFLEXED)
ACINETOBACTER BAUMANNII: NOT DETECTED
CANDIDA KRUSEI: NOT DETECTED
Candida albicans: NOT DETECTED
Candida glabrata: NOT DETECTED
Candida parapsilosis: NOT DETECTED
Candida tropicalis: NOT DETECTED
ENTEROBACTER CLOACAE COMPLEX: NOT DETECTED
ENTEROCOCCUS SPECIES: NOT DETECTED
ESCHERICHIA COLI: NOT DETECTED
Enterobacteriaceae species: NOT DETECTED
Haemophilus influenzae: NOT DETECTED
Klebsiella oxytoca: NOT DETECTED
Klebsiella pneumoniae: NOT DETECTED
LISTERIA MONOCYTOGENES: NOT DETECTED
Methicillin resistance: DETECTED — AB
NEISSERIA MENINGITIDIS: NOT DETECTED
PSEUDOMONAS AERUGINOSA: NOT DETECTED
Proteus species: NOT DETECTED
SERRATIA MARCESCENS: NOT DETECTED
STAPHYLOCOCCUS AUREUS BCID: NOT DETECTED
STREPTOCOCCUS AGALACTIAE: NOT DETECTED
STREPTOCOCCUS PNEUMONIAE: NOT DETECTED
STREPTOCOCCUS SPECIES: NOT DETECTED
Staphylococcus species: DETECTED — AB
Streptococcus pyogenes: NOT DETECTED

## 2015-12-24 LAB — BASIC METABOLIC PANEL
Anion gap: 8 (ref 5–15)
BUN: 15 mg/dL (ref 6–20)
CHLORIDE: 103 mmol/L (ref 101–111)
CO2: 23 mmol/L (ref 22–32)
CREATININE: 1.37 mg/dL — AB (ref 0.44–1.00)
Calcium: 8.9 mg/dL (ref 8.9–10.3)
GFR calc Af Amer: 60 mL/min — ABNORMAL LOW (ref >60)
GFR calc non Af Amer: 51 mL/min — ABNORMAL LOW (ref >60)
GLUCOSE: 96 mg/dL (ref 65–99)
Potassium: 4.2 mmol/L (ref 3.5–5.1)
Sodium: 134 mmol/L — ABNORMAL LOW (ref 135–145)

## 2015-12-24 LAB — URINE MICROSCOPIC-ADD ON: BACTERIA UA: NONE SEEN

## 2015-12-24 MED ORDER — VANCOMYCIN HCL 10 G IV SOLR
1250.0000 mg | Freq: Once | INTRAVENOUS | Status: AC
  Administered 2015-12-24: 1250 mg via INTRAVENOUS
  Filled 2015-12-24: qty 1250

## 2015-12-24 MED ORDER — VANCOMYCIN HCL 500 MG IV SOLR
500.0000 mg | Freq: Two times a day (BID) | INTRAVENOUS | Status: DC
  Administered 2015-12-25: 500 mg via INTRAVENOUS
  Filled 2015-12-24 (×2): qty 500

## 2015-12-24 NOTE — Progress Notes (Signed)
Pt temp 102.7 orally - all other VS stable.  Provider on call notified with new orders for IV vanc.  Tylenol administered for temp and will give Vanc per orders.

## 2015-12-24 NOTE — Progress Notes (Addendum)
Called re: nursing for temperature to 102.7.  Also called earlier in the night by ID pharmacy as patients biofire results showed MRSE (staph epi) in 1/2 bottles. Though this would usually be considered a contaminant, on review of her chart, she has been having nightly unexplained fevers for which these cultures were drawn.  She also has a urology consultation from today.  In light of continued fevers on appropriate Abx to cover Klebsiella found in the urine, will give a dose of Vancomycin tonight.  Renal function improving daily, consider repeat CT scan of the abdomen in the AM given persistent fevers.  Also in the differential is drug fever from the cephalosporin.   Debe CoderMULLEN, EMILY, MD

## 2015-12-24 NOTE — Progress Notes (Signed)
ANTIBIOTIC CONSULT NOTE - INITIAL  Pharmacy Consult for vancomycin Indication: bacteremia  No Known Allergies  Patient Measurements: Height: 5\' 4"  (162.6 cm) Weight: 131 lb (59.4 kg) IBW/kg (Calculated) : 54.7   Vital Signs: Temp: 98.8 F (37.1 C) (11/11 1348) Temp Source: Oral (11/11 1348) BP: 104/63 (11/11 1348) Pulse Rate: 74 (11/11 1348) Intake/Output from previous day: 11/10 0701 - 11/11 0700 In: 600 [P.O.:600] Out: -  Intake/Output from this shift: No intake/output data recorded.  Labs:  Recent Labs  12/22/15 0345 12/23/15 0429 12/24/15 0346  WBC 9.2 8.1  --   HGB 8.6* 8.8*  --   PLT 236 267  --   CREATININE 1.54* 1.42* 1.37*   Estimated Creatinine Clearance: 52.3 mL/min (by C-G formula based on SCr of 1.37 mg/dL (H)). No results for input(s): VANCOTROUGH, VANCOPEAK, VANCORANDOM, GENTTROUGH, GENTPEAK, GENTRANDOM, TOBRATROUGH, TOBRAPEAK, TOBRARND, AMIKACINPEAK, AMIKACINTROU, AMIKACIN in the last 72 hours.   Microbiology: Recent Results (from the past 720 hour(s))  Urine culture     Status: Abnormal   Collection Time: 12/04/15 12:27 PM  Result Value Ref Range Status   Specimen Description URINE, CLEAN CATCH  Final   Special Requests NONE  Final   Culture >=100,000 COLONIES/mL KLEBSIELLA PNEUMONIAE (A)  Final   Report Status 12/06/2015 FINAL  Final   Organism ID, Bacteria KLEBSIELLA PNEUMONIAE (A)  Final      Susceptibility   Klebsiella pneumoniae - MIC*    AMPICILLIN >=32 RESISTANT Resistant     CEFAZOLIN <=4 SENSITIVE Sensitive     CEFTRIAXONE <=1 SENSITIVE Sensitive     CIPROFLOXACIN <=0.25 SENSITIVE Sensitive     GENTAMICIN <=1 SENSITIVE Sensitive     IMIPENEM <=0.25 SENSITIVE Sensitive     NITROFURANTOIN 32 SENSITIVE Sensitive     TRIMETH/SULFA <=20 SENSITIVE Sensitive     AMPICILLIN/SULBACTAM 4 SENSITIVE Sensitive     PIP/TAZO <=4 SENSITIVE Sensitive     Extended ESBL NEGATIVE Sensitive     * >=100,000 COLONIES/mL KLEBSIELLA PNEUMONIAE  Urine  culture     Status: None   Collection Time: 12/11/15  8:40 AM  Result Value Ref Range Status   Specimen Description URINE, CLEAN CATCH  Final   Special Requests NONE  Final   Culture NO GROWTH Performed at [facility]   Final   Report Status 12/12/2015 FINAL  Final  Blood culture (routine x 2)     Status: None   Collection Time: 12/13/15  5:22 PM  Result Value Ref Range Status   Specimen Description BLOOD RIGHT ANTECUBITAL  Final   Special Requests BOTTLES DRAWN AEROBIC AND ANAEROBIC 5CC  Final   Culture NO GROWTH 5 DAYS  Final   Report Status 12/18/2015 FINAL  Final  Blood culture (routine x 2)     Status: None   Collection Time: 12/13/15  5:45 PM  Result Value Ref Range Status   Specimen Description BLOOD LEFT ANTECUBITAL  Final   Special Requests IN PEDIATRIC BOTTLE 3CC  Final   Culture NO GROWTH 5 DAYS  Final   Report Status 12/18/2015 FINAL  Final  Urine culture     Status: Abnormal   Collection Time: 12/13/15  8:31 PM  Result Value Ref Range Status   Specimen Description URINE, CLEAN CATCH  Final   Special Requests NONE  Final   Culture >=100,000 COLONIES/mL KLEBSIELLA PNEUMONIAE (A)  Final   Report Status 12/16/2015 FINAL  Final   Organism ID, Bacteria KLEBSIELLA PNEUMONIAE (A)  Final  Susceptibility   Klebsiella pneumoniae - MIC*    AMPICILLIN >=32 RESISTANT Resistant     CEFAZOLIN <=4 SENSITIVE Sensitive     CEFTRIAXONE <=1 SENSITIVE Sensitive     CIPROFLOXACIN <=0.25 SENSITIVE Sensitive     GENTAMICIN <=1 SENSITIVE Sensitive     IMIPENEM <=0.25 SENSITIVE Sensitive     NITROFURANTOIN 32 SENSITIVE Sensitive     TRIMETH/SULFA <=20 SENSITIVE Sensitive     AMPICILLIN/SULBACTAM 4 SENSITIVE Sensitive     PIP/TAZO <=4 SENSITIVE Sensitive     Extended ESBL NEGATIVE Sensitive     * >=100,000 COLONIES/mL KLEBSIELLA PNEUMONIAE  MRSA PCR Screening     Status: None   Collection Time: 12/14/15  5:11 AM  Result Value Ref Range Status   MRSA by PCR NEGATIVE  NEGATIVE Final    Comment:        The GeneXpert MRSA Assay (FDA approved for NASAL specimens only), is one component of a comprehensive MRSA colonization surveillance program. It is not intended to diagnose MRSA infection nor to guide or monitor treatment for MRSA infections.   Culture, blood (routine x 2)     Status: None (Preliminary result)   Collection Time: 12/23/15 11:52 AM  Result Value Ref Range Status   Specimen Description BLOOD RIGHT ANTECUBITAL  Final   Special Requests BOTTLES DRAWN AEROBIC ONLY 5CC  Final   Culture  Setup Time   Final    Organism ID to follow GRAM POSITIVE COCCI IN CLUSTERS AEROBIC BOTTLE ONLY CRITICAL RESULT CALLED TO, READ BACK BY AND VERIFIED WITH: CALLED PHARM T RUDISILL AT 1945 MARTINB [id number]    Culture PENDING  Incomplete   Report Status PENDING  Incomplete  Blood Culture ID Panel (Reflexed)     Status: Abnormal   Collection Time: 12/23/15 11:52 AM  Result Value Ref Range Status   Enterococcus species NOT DETECTED NOT DETECTED Final   Listeria monocytogenes NOT DETECTED NOT DETECTED Final   Staphylococcus species DETECTED (A) NOT DETECTED Final    Comment: CRITICAL RESULT CALLED TO, READ BACK BY AND VERIFIED WITH: CALLED PHARM T RUDISILL AT 1945 MARTINB [phone]    Staphylococcus aureus NOT DETECTED NOT DETECTED Final   Methicillin resistance DETECTED (A) NOT DETECTED Final    Comment: CRITICAL RESULT CALLED TO, READ BACK BY AND VERIFIED WITH: CALLED PHARM T RUDISILL AT 1945 MARTINB [phone]    Streptococcus species NOT DETECTED NOT DETECTED Final   Streptococcus agalactiae NOT DETECTED NOT DETECTED Final   Streptococcus pneumoniae NOT DETECTED NOT DETECTED Final   Streptococcus pyogenes NOT DETECTED NOT DETECTED Final   Acinetobacter baumannii NOT DETECTED NOT DETECTED Final   Enterobacteriaceae species NOT DETECTED NOT DETECTED Final   Enterobacter cloacae complex NOT DETECTED NOT DETECTED Final   Escherichia coli NOT DETECTED NOT  DETECTED Final   Klebsiella oxytoca NOT DETECTED NOT DETECTED Final   Klebsiella pneumoniae NOT DETECTED NOT DETECTED Final   Proteus species NOT DETECTED NOT DETECTED Final   Serratia marcescens NOT DETECTED NOT DETECTED Final   Haemophilus influenzae NOT DETECTED NOT DETECTED Final   Neisseria meningitidis NOT DETECTED NOT DETECTED Final   Pseudomonas aeruginosa NOT DETECTED NOT DETECTED Final   Candida albicans NOT DETECTED NOT DETECTED Final   Candida glabrata NOT DETECTED NOT DETECTED Final   Candida krusei NOT DETECTED NOT DETECTED Final   Candida parapsilosis NOT DETECTED NOT DETECTED Final   Candida tropicalis NOT DETECTED NOT DETECTED Final  Culture, blood (routine x 2)     Status: None (Preliminary result)  Collection Time: 12/23/15 11:58 AM  Result Value Ref Range Status   Specimen Description BLOOD RIGHT ARM  Final   Special Requests IN PEDIATRIC BOTTLE 2.5CC  Final   Culture NO GROWTH < 24 HOURS  Final   Report Status PENDING  Incomplete    Medical History: Past Medical History:  Diagnosis Date  . Medical history non-contributory    Assessment: 30 year old F w/ Hxof childbirth 10/01/2017who presented to the hospital with back pain, shortness of breath, fatigue, feet swelling and abdominal distention.   Patient with continued fevers now with 1/2 blood cultures growing methicillin resistant staph species (not MRSA). New orders received to resume IV vancomycin. Wbc is within normal limits.   Goal of Therapy:  Vancomycin trough level 15-20 mcg/ml  Plan:  Vancomycin 1250mg   IV x1 now then 500mg  q12 hours Follow up fever curve and culture data  Sheppard CoilFrank Skyah Hannon PharmD., BCPS Clinical Pharmacist Pager [phone] 12/24/2015 8:48 PM

## 2015-12-24 NOTE — Consult Note (Signed)
Urology Consult   Physician requesting consult: Dr. Susie Cassette  Reason for consult: Left hydronephrosis, pyelonephritis, AKI  History of Present Illness: Carolyn Lynn is a 30 y.o. female who presented to the hospital about 10 days ago with septic shock.  She was approximately one month postpartum when she presented to the ED at the end of October with a UTI.  Her culture was positive for Klebsiella and she was placed on cephalexin to which her culture was sensitive.  She did not improve and developed worsening left flank pain and dizziness.  She presented to the ED on 10/31 and was noted to be hypotensive. She was not febrile at the time of admission.  She was admitted to the ICU with presumed sepsis related to a UTI.  Her urine culture was again positive for Klebisella.  She was placed on Zosyn.  She has since improved and regained hemodynamic stability.  On her admission, she was noted to have an AKI with Cr of about 2.4.  She underwent a CT of the abdomen and pelvis without contrast that demonstrated bilateral hydrosalpinges with left renal pelvic fullness/mild hydronephrosis.  She improved clinically.  Urology is consulted today as she has continued having left flank pain and low grade fever of 99.6 intermittently.  A renal ultrasound was again performed and this demonstrated the appearance of more significant left hydronephrosis.  She states that her flank pain is currently mild and very much improved compared to admission.  Her renal function has steadily improved since she was resuscitated with a Cr of 1.37 today.  Her fever curve has actually been gradually improving over the past 5 days when her temperature was as high as 102.  Each day her temperature spikes have been lower.  She otherwise is eating and drinking fairly well.  She is currently on oral cefuroxime.  She denies a history of voiding or storage urinary symptoms, hematuria, UTIs, STDs, urolithiasis, GU malignancy/trauma/surgery.  She  was found to have evidence of CHF with an EF of 45% during her hospitalization and has been managed by Cardiology medically.  Past Medical History:  Diagnosis Date  . Medical history non-contributory     Past Surgical History:  Procedure Laterality Date  . NO PAST SURGERIES       Current Hospital Medications:  Home meds:    Medication List    STOP taking these medications   ibuprofen 200 MG tablet Commonly known as:  ADVIL,MOTRIN     TAKE these medications   carvedilol 3.125 MG tablet Commonly known as:  COREG Take 1 tablet (3.125 mg total) by mouth 2 (two) times daily with a meal.   cefUROXime 500 MG tablet Commonly known as:  CEFTIN Take 1 tablet (500 mg total) by mouth 2 (two) times daily with a meal.   docusate sodium 100 MG capsule Commonly known as:  COLACE Take 1 capsule (100 mg total) by mouth every 12 (twelve) hours. What changed:  when to take this  reasons to take this   famotidine 20 MG tablet Commonly known as:  PEPCID Take 1 tablet (20 mg total) by mouth daily.   ferrous sulfate 325 (65 FE) MG tablet Take 325 mg by mouth 3 (three) times daily.   prenatal multivitamin Tabs tablet Take 1 tablet by mouth daily.       Scheduled Meds: . carvedilol  3.125 mg Oral BID WC  . cefUROXime  500 mg Oral BID WC  . famotidine  20 mg Oral Daily  .  feeding supplement (ENSURE ENLIVE)  237 mL Oral Q24H  . ferrous sulfate  325 mg Oral BID WC  . senna-docusate  1 tablet Oral BID  . vitamin C  500 mg Oral BID   Continuous Infusions: PRN Meds:.acetaminophen, diphenhydrAMINE, fentaNYL (SUBLIMAZE) injection, ondansetron (ZOFRAN) IV, oxyCODONE, prochlorperazine  Allergies: No Known Allergies  History reviewed. No pertinent family history.  Social History:  reports that she has never smoked. She has never used smokeless tobacco. She reports that she does not drink alcohol or use drugs.  ROS: A complete review of systems was performed.  All systems are  negative except for pertinent findings as noted.  Physical Exam:  Vital signs in last 24 hours: Temp:  [98.6 F (37 C)-99.6 F (37.6 C)] 98.6 F (37 C) (11/11 0910) Pulse Rate:  [63-70] 66 (11/11 0910) Resp:  [18] 18 (11/11 0543) BP: (102-105)/(55-65) 105/65 (11/11 0922) SpO2:  [98 %-99 %] 99 % (11/11 0910) Weight:  [59.4 kg (131 lb)] 59.4 kg (131 lb) (11/11 0543) Constitutional:  Alert and oriented, No acute distress Cardiovascular: Regular rate and rhythm, No JVD Respiratory: Normal respiratory effort, Lungs clear bilaterally GI: Abdomen is soft, nontender, nondistended, no abdominal masses GU: Very mild left CVAT.  No right CVAT. Lymphatic: No lymphadenopathy Neurologic: Grossly intact, no focal deficits Psychiatric: Normal mood and affect  Laboratory Data:   Recent Labs  12/22/15 0345 12/23/15 0429  WBC 9.2 8.1  HGB 8.6* 8.8*  HCT 27.7* 28.1*  PLT 236 267      Recent Labs  12/22/15 0345 12/23/15 0429 12/24/15 0346  NA 132* 134* 134*  K 4.1 4.2 4.2  CL 101 103 103  GLUCOSE 92 91 96  BUN 18 17 15   CALCIUM 8.6* 8.8* 8.9  CREATININE 1.54* 1.42* 1.37*     Results for orders placed or performed during the hospital encounter of 12/13/15 (from the past 24 hour(s))  Basic metabolic panel     Status: Abnormal   Collection Time: 12/24/15  3:46 AM  Result Value Ref Range   Sodium 134 (L) 135 - 145 mmol/L   Potassium 4.2 3.5 - 5.1 mmol/L   Chloride 103 101 - 111 mmol/L   CO2 23 22 - 32 mmol/L   Glucose, Bld 96 65 - 99 mg/dL   BUN 15 6 - 20 mg/dL   Creatinine, Ser 8.291.37 (H) 0.44 - 1.00 mg/dL   Calcium 8.9 8.9 - 56.210.3 mg/dL   GFR calc non Af Amer 51 (L) >60 mL/min   GFR calc Af Amer 60 (L) >60 mL/min   Anion gap 8 5 - 15   Recent Results (from the past 240 hour(s))  Culture, blood (routine x 2)     Status: None (Preliminary result)   Collection Time: 12/23/15 11:52 AM  Result Value Ref Range Status   Specimen Description BLOOD RIGHT ANTECUBITAL  Final    Special Requests BOTTLES DRAWN AEROBIC ONLY 5CC  Final   Culture NO GROWTH < 24 HOURS  Final   Report Status PENDING  Incomplete  Culture, blood (routine x 2)     Status: None (Preliminary result)   Collection Time: 12/23/15 11:58 AM  Result Value Ref Range Status   Specimen Description BLOOD RIGHT ARM  Final   Special Requests IN PEDIATRIC BOTTLE 2.5CC  Final   Culture NO GROWTH < 24 HOURS  Final   Report Status PENDING  Incomplete    Renal Function:  Recent Labs  12/18/15 0241 12/19/15 0237 12/20/15 0214 12/22/15  0345 12/23/15 0429 12/24/15 0346  CREATININE 2.42* 2.08* 1.87* 1.54* 1.42* 1.37*   Estimated Creatinine Clearance: 52.3 mL/min (by C-G formula based on SCr of 1.37 mg/dL (H)).  Radiologic Imaging: Dg Chest 2 View  Result Date: 12/22/2015 CLINICAL DATA:  Fever for 9 days. EXAM: CHEST  2 VIEW COMPARISON:  12/16/2015 FINDINGS: There is persistent opacity of both lung bases. Appearance is more disc wide in may represent atelectasis more than infiltrate at this time. There has been some improvement in aeration. Small bilateral pleural effusions are noted. IMPRESSION: Some improvement in aeration. Persistent bibasilar opacity consistent with atelectasis or resolving infiltrates. Small residual effusions. Electronically Signed   By: Norva PavlovElizabeth  Brown M.D.   On: 12/22/2015 13:56   [place] Renal  Result Date: 12/23/2015 CLINICAL DATA:  Patient with fever. EXAM: RENAL / URINARY TRACT ULTRASOUND COMPLETE COMPARISON:  Renal ultrasound 12/15/2015. FINDINGS: Right Kidney: Length: 13.1 cm. Echogenicity within normal limits. No mass or hydronephrosis visualized. Left Kidney: Length: 12.3 cm. Normal renal cortical thickness. There is moderate left hydronephrosis. Bladder: Echogenic debris demonstrated within the bladder lumen. IMPRESSION: Moderate left hydronephrosis. Echogenic debris within the bladder lumen. Electronically Signed   By: Annia Beltrew  Davis M.D.   On: 12/23/2015 17:44    I  independently reviewed the above imaging studies.  Impression/Recommendation: 1) Left pyelonephritis:  This appears to be resolving.  She is clinically improving (albeit slowly) each day.  If she demonstrates signs of worsening infection or increasing pain, she would likely benefit from a repeat CT of the abdomen and pelvis with IV contrast and delays.  It is reasonable to hold off on this for now considering her recent AKI as long as she continues to clinically improve.  She should continue culture sensitive antibiotics for a minimum of 14 days from her admission.  She should have a repeat UA to demonstrate resolution after treatment.  2) Left hydronephrosis: Her renal ultrasound suggests worsening left hydronephrosis.  She had not clear etiology for an intrinsic complete obstruction upon admission (no stone, etc.) and it is most likely that her hydronephrosis is due to her hydrosalpinx and likely would not indicate a complete infection.  As long as she is improving (no signs of worsening infection or worsening renal function), I would not recommend urologic intervention.  If she worsens clinically, a CT with contrast and delayed images will help to determine if she truly has functional obstruction that might require stenting.  Otherwise, she may simply require outpatient follow up with a repeat ultrasound in a few weeks to ensure her hydronephrosis has resolved.  I will continue to follow.  Kadyn Chovan,LES 12/24/2015, 1:41 PM  Moody BruinsLester S. Raha Tennison Jr. MD   CC: Dr. Susie CassetteAbrol

## 2015-12-24 NOTE — Progress Notes (Signed)
[place] TEAM 1 - Stepdown/ICU TEAM  Rosita KeaJervita J Lynn  QMV:[id number] DOB: 11/07/1985 DOA: 12/13/2015 PCP: No PCP Per Patient    Brief Narrative:  30 year old F w/ Hx of childbirth 11/13/2015 who presented to the hospital with back pain, shortness of breath, fatigue, feet swelling and abdominal distention. On exam she was hypotensive and tachycardic. Patient's blood pressures were consistently in the 70s and 80s. Patient's blood work showed a Hgb of 4.9. CMP with new acute kidney injury with her creatinine of 2.2.   Subjective: Patient continues to have fever also complaining of left flank pain   Assessment & Plan: Acute systolic CHF - EF 40-45% -?peripartum CM - Cards has evaluated - to have f/u TTE in 2-3 months - no ACEi/ARB due to AKI - BB initiated per Cards today - not volume overloaded on exam . Continue Coreg She needs post hospital follow-up with cardiology to follow her left ventricular function. Decisions about whether she can become pregnant again will have to be made very carefully. At this point we can either prove or disprove that her LV dysfunction was caused as a postpartum problem. Filed Weights   12/22/15 0620 12/23/15 0522 12/24/15 0543  Weight: 60.6 kg (133 lb 9.6 oz) 59.7 kg (131 lb 11.2 oz) 59.4 kg (131 lb)    Persistent fever Likely source is urine, CT scan on 10/31 showed bilateral hydrosalpinges Mild left hydronephrosis could be secondary to enlarged hydrosalpinges Patient has had a positive urine culture. Klebsiella pneumonia and has fever despite being on cefuroxime Repeated  blood cultures 11/10,Patient unable to do MRI of the lumbar spine to rule out discitis, renal ultrasound  showed moderate left hydronephrosis Consultants neurology Dr. Laverle PatterBorden for further recommendations, Repeat UA   Hypotension / septic Shock  -due to hypovolemia, UTI, cardiogenic shock, and hemorrhage - BP has stabilized - Cardiology added Coreg  Acute Renal failure - Mild L  Hydronephrosis -secondary to UTI and hypotensive shock - mild hydro felt to be due to hematosalpinx - crt cont to trend down at this time - follow - avoid nephrotoxins   Recent Labs Lab 12/19/15 0237 12/20/15 0214 12/22/15 0345 12/23/15 0429 12/24/15 0346  CREATININE 2.08* 1.87* 1.54* 1.42* 1.37*    Klebsiella pneumoniae UTI/HCAP? -plan to treat for 14 days- transitioned to oral abx - follow fever curve as fever recurred early this morning Chest x-ray 11/9  also concerning for pneumonia, however patient has no pulmonary symptoms  Large bilateral hematosalpinx Noted on transvaginal US - GYN has evaluated   Hepatosplenomegaly Felt to likely be due to passive congestion in the setting of acute heart failure   Acute anemia / Fe Deficiency anemia S/p 5U PRBC total - Hgb stabilizing / improving - cont to follow trend   Recent Labs Lab 12/18/15 1040 12/19/15 0237 12/20/15 0214 12/22/15 0345 12/23/15 0429  HGB 8.3* 8.0* 9.0* 8.6* 8.8*    Hypokalemia Repleted  Hypomagnesemia -corrected   DVT prophylaxis: SCDs Code Status: FULL CODE Family Communication: Spoke with mother at bedside  Disposition Plan: Not Stable for discharge, urology consult  [facility]  [facility]  [facility]   Urology  Procedures: 10/31 CT abdomen pelvis WO contrast - enlarged hydrosalpinges (.infection vs blood) -Left kidney;suspected hydronephrosis. - Hepatosplenomegaly. - Small volume free fluid within the abdomen and pelvis with mild diffuse anasarca, suspected  Intrinsic liver disease. - Enlarged periaortic adenopathy  11/2 TTE EF 40% to 45%. Mild diffuse hypokinesis     Antimicrobials:  Zosyn  10/31 > 11/2 Vancomycin 10/31 > 11/2 Rocephin 11/3 > 11/6 Ceftin 11/7 >  Objective: Blood pressure 105/65, pulse 66, temperature 98.6 F (37 C), temperature source Oral, resp. rate 18, height 5\' 4"  (1.626 m), weight 59.4 kg (131 lb), last menstrual period  12/22/2015, SpO2 99 %, unknown if currently breastfeeding.  Intake/Output Summary (Last 24 hours) at 12/24/15 1047 Last data filed at 12/23/15 2000  Gross per 24 hour  Intake              360 ml  Output                0 ml  Net              360 ml   Filed Weights   12/22/15 0620 12/23/15 0522 12/24/15 0543  Weight: 60.6 kg (133 lb 9.6 oz) 59.7 kg (131 lb 11.2 oz) 59.4 kg (131 lb)    Examination: General: No acute respiratory distress - alert  Lungs: Clear to auscultation bilaterally  Cardiovascular: Regular rate and rhythm without murmur  Abdomen: Mildly tender diffusely, nondistended, soft, bowel sounds positive, no rebound, no ascites, no appreciable mass Extremities: No significant cyanosis, clubbing, edema bilateral lower extremities  CBC:  Recent Labs Lab 12/18/15 0241 12/18/15 1040 12/19/15 0237 12/20/15 0214 12/22/15 0345 12/23/15 0429  WBC 5.7  --  7.8 10.0 9.2 8.1  HGB 6.8* 8.3* 8.0* 9.0* 8.6* 8.8*  HCT 20.9* 25.5* 24.3* 28.2* 27.7* 28.1*  MCV 74.9*  --  76.9* 77.5* 79.6 79.4  PLT 179  --  182 225 236 267     Basic Metabolic Panel:  Recent Labs Lab 12/18/15 0241 12/19/15 0237 12/20/15 0214 12/22/15 0345 12/23/15 0429 12/24/15 0346  NA 132* 131* 132* 132* 134* 134*  K 3.5 3.4* 3.7 4.1 4.2 4.2  CL 96* 95* 96* 101 103 103  CO2 25 25 23 24 24 23   GLUCOSE 94 90 91 92 91 96  BUN 24* 25* 26* 18 17 15   CREATININE 2.42* 2.08* 1.87* 1.54* 1.42* 1.37*  CALCIUM 8.2* 8.4* 8.7* 8.6* 8.8* 8.9  MG 1.9  --   --   --   --   --   PHOS 5.1*  --  4.4  --   --   --    GFR: Estimated Creatinine Clearance: 52.3 mL/min (by C-G formula based on SCr of 1.37 mg/dL (H)).  Liver Function Tests:  Recent Labs Lab 12/20/15 0214 12/22/15 0345 12/23/15 0429  AST 23 22 20   ALT 14 14 13*  ALKPHOS 140* 133* 141*  BILITOT 1.5* 1.5* 1.2  PROT 7.8 6.8 7.2  ALBUMIN 1.9*  1.9* 1.8* 2.0*    Coagulation Profile: No results for input(s): INR, PROTIME in the last 168  hours.  Recent Results (from the past 240 hour(s))  Culture, blood (routine x 2)     Status: None (Preliminary result)   Collection Time: 12/23/15 11:52 AM  Result Value Ref Range Status   Specimen Description BLOOD RIGHT ANTECUBITAL  Final   Special Requests BOTTLES DRAWN AEROBIC ONLY 5CC  Final   Culture NO GROWTH < 24 HOURS  Final   Report Status PENDING  Incomplete  Culture, blood (routine x 2)     Status: None (Preliminary result)   Collection Time: 12/23/15 11:58 AM  Result Value Ref Range Status   Specimen Description BLOOD RIGHT ARM  Final   Special Requests IN PEDIATRIC BOTTLE 2.5CC  Final   Culture NO GROWTH < 24 HOURS  Final   Report Status PENDING  Incomplete     Scheduled Meds: . carvedilol  3.125 mg Oral BID WC  . cefUROXime  500 mg Oral BID WC  . famotidine  20 mg Oral Daily  . feeding supplement (ENSURE ENLIVE)  237 mL Oral Q24H  . ferrous sulfate  325 mg Oral BID WC  . senna-docusate  1 tablet Oral BID  . vitamin C  500 mg Oral BID     LOS: 10 days     [facility] Office  [phone] Pager - Text Page per Loretha StaplerAmion as per below:  On-Call/Text Page:      [email or web address]      password [email or web address]  If 7PM-7AM, please contact night-coverage [email or web address] Password [email or web address] 12/24/2015, 10:47 AM

## 2015-12-24 NOTE — Progress Notes (Signed)
PHARMACY - PHYSICIAN COMMUNICATION CRITICAL VALUE ALERT - BLOOD CULTURE IDENTIFICATION (BCID)  Results for orders placed or performed during the hospital encounter of 12/13/15  Blood Culture ID Panel (Reflexed) (Collected: 12/23/2015 11:52 AM)  Result Value Ref Range   Enterococcus species NOT DETECTED NOT DETECTED   Listeria monocytogenes NOT DETECTED NOT DETECTED   Staphylococcus species DETECTED (A) NOT DETECTED   Staphylococcus aureus NOT DETECTED NOT DETECTED   Methicillin resistance DETECTED (A) NOT DETECTED   Streptococcus species NOT DETECTED NOT DETECTED   Streptococcus agalactiae NOT DETECTED NOT DETECTED   Streptococcus pneumoniae NOT DETECTED NOT DETECTED   Streptococcus pyogenes NOT DETECTED NOT DETECTED   Acinetobacter baumannii NOT DETECTED NOT DETECTED   Enterobacteriaceae species NOT DETECTED NOT DETECTED   Enterobacter cloacae complex NOT DETECTED NOT DETECTED   Escherichia coli NOT DETECTED NOT DETECTED   Klebsiella oxytoca NOT DETECTED NOT DETECTED   Klebsiella pneumoniae NOT DETECTED NOT DETECTED   Proteus species NOT DETECTED NOT DETECTED   Serratia marcescens NOT DETECTED NOT DETECTED   Haemophilus influenzae NOT DETECTED NOT DETECTED   Neisseria meningitidis NOT DETECTED NOT DETECTED   Pseudomonas aeruginosa NOT DETECTED NOT DETECTED   Candida albicans NOT DETECTED NOT DETECTED   Candida glabrata NOT DETECTED NOT DETECTED   Candida krusei NOT DETECTED NOT DETECTED   Candida parapsilosis NOT DETECTED NOT DETECTED   Candida tropicalis NOT DETECTED NOT DETECTED    Name of physician (or Provider) Contacted: Inetta FermoEmily Mullens  Changes to prescribed antibiotics required: Observe overnight and add vancomycin in AM if febrile again  Toniann Failony L Odie Rauen 12/24/2015  8:11 PM

## 2015-12-25 ENCOUNTER — Inpatient Hospital Stay (HOSPITAL_COMMUNITY): Payer: Medicaid Other

## 2015-12-25 ENCOUNTER — Encounter (HOSPITAL_COMMUNITY): Payer: Self-pay | Admitting: Radiology

## 2015-12-25 DIAGNOSIS — Z87440 Personal history of urinary (tract) infections: Secondary | ICD-10-CM

## 2015-12-25 DIAGNOSIS — R509 Fever, unspecified: Secondary | ICD-10-CM | POA: Diagnosis present

## 2015-12-25 DIAGNOSIS — N7011 Chronic salpingitis: Secondary | ICD-10-CM

## 2015-12-25 DIAGNOSIS — R590 Localized enlarged lymph nodes: Secondary | ICD-10-CM

## 2015-12-25 LAB — BASIC METABOLIC PANEL
Anion gap: 9 (ref 5–15)
BUN: 14 mg/dL (ref 6–20)
CHLORIDE: 104 mmol/L (ref 101–111)
CO2: 21 mmol/L — ABNORMAL LOW (ref 22–32)
Calcium: 9.1 mg/dL (ref 8.9–10.3)
Creatinine, Ser: 1.4 mg/dL — ABNORMAL HIGH (ref 0.44–1.00)
GFR, EST AFRICAN AMERICAN: 58 mL/min — AB (ref >60)
GFR, EST NON AFRICAN AMERICAN: 50 mL/min — AB (ref >60)
Glucose, Bld: 112 mg/dL — ABNORMAL HIGH (ref 65–99)
POTASSIUM: 4.2 mmol/L (ref 3.5–5.1)
SODIUM: 134 mmol/L — AB (ref 135–145)

## 2015-12-25 LAB — CBC
HCT: 32.3 % — ABNORMAL LOW (ref 36.0–46.0)
HEMOGLOBIN: 10.3 g/dL — AB (ref 12.0–15.0)
MCH: 25.4 pg — ABNORMAL LOW (ref 26.0–34.0)
MCHC: 31.9 g/dL (ref 30.0–36.0)
MCV: 79.8 fL (ref 78.0–100.0)
PLATELETS: 350 10*3/uL (ref 150–400)
RBC: 4.05 MIL/uL (ref 3.87–5.11)
RDW: 18.9 % — ABNORMAL HIGH (ref 11.5–15.5)
WBC: 10.7 10*3/uL — AB (ref 4.0–10.5)

## 2015-12-25 LAB — DIFFERENTIAL
BASOS PCT: 1 %
Basophils Absolute: 0.1 10*3/uL (ref 0.0–0.1)
EOS PCT: 2 %
Eosinophils Absolute: 0.2 10*3/uL (ref 0.0–0.7)
LYMPHS ABS: 2.1 10*3/uL (ref 0.7–4.0)
LYMPHS PCT: 19 %
MONO ABS: 1 10*3/uL (ref 0.1–1.0)
Monocytes Relative: 8 %
NEUTROS ABS: 8 10*3/uL — AB (ref 1.7–7.7)
NEUTROS PCT: 70 %

## 2015-12-25 MED ORDER — IOPAMIDOL (ISOVUE-300) INJECTION 61%
INTRAVENOUS | Status: AC
  Administered 2015-12-25: 80 mL
  Filled 2015-12-25: qty 100

## 2015-12-25 MED ORDER — LORAZEPAM 2 MG/ML IJ SOLN: 1.0000 mg | Freq: Once | INTRAMUSCULAR | Status: DC

## 2015-12-25 NOTE — Progress Notes (Signed)
[name] TEAM 1 - Stepdown/ICU TEAM  Carolyn Lynn  UJW:[id number] DOB: 12/26/1985 DOA: 12/13/2015 PCP: No PCP Per Patient    Brief Narrative:  30 year old F w/ Hx of childbirth 11/13/2015 who presented to the hospital with back pain, shortness of breath, fatigue, feet swelling and abdominal distention. On exam she was hypotensive and tachycardic. Patient's blood pressures were consistently in the 70s and 80s. Patient's blood work showed a Hgb of 4.9. CMP with new acute kidney injury with her creatinine of 2.2.   Subjective: Patient continues to have high-grade fever, blood culture 1 out of 2 bottles positive for staph epi Still complaining of left flank pain  Assessment & Plan: Acute systolic CHF - EF 40-45% -?peripartum CM - Cards has evaluated - to have f/u TTE in 2-3 months - no ACEi/ARB due to AKI - BB initiated per Cards today - not volume overloaded on exam . Continue Coreg She needs post hospital follow-up with cardiology to follow her left ventricular function. Decisions about whether she can become pregnant again will have to be made very carefully. LV dysfunction was caused as a postpartum problem.   Persistent fever Likely source is urine, CT scan on 10/31 showed bilateral hydrosalpinges Mild left hydronephrosis could be secondary to enlarged hydrosalpinges or UPJ stone Patient has had a positive urine culture. Klebsiella pneumonia and has fever despite being on cefuroxime Repeated  blood cultures 11/10 one out of 2 bottles positive for staph epi, vancomycin added to her antibiotic regimen 11/11 ,Patient unable to do MRI of the lumbar spine to rule out discitis, will reattempt this today  renal ultrasound  showed moderate left hydronephrosis Urology consulted, given increase in the temperature, CT scan ordered for possible stent placement later today, patient will be kept NPO Will discontinue cefuroxime and switch patient to zosyn ID consult for further recommendations    Hypotension / septic Shock  -due to hypovolemia, UTI, cardiogenic shock, and hemorrhage - BP has stabilized - Cardiology added Coreg  Acute Renal failure - Mild L Hydronephrosis -secondary to UTI and hypotensive shock - mild hydro felt to be due to hematosalpinx - crt cont to trend down at this time - follow - avoid nephrotoxins   Recent Labs Lab 12/19/15 0237 12/20/15 0214 12/22/15 0345 12/23/15 0429 12/24/15 0346  CREATININE 2.08* 1.87* 1.54* 1.42* 1.37*    Klebsiella pneumoniae UTI/HCAP? Chest x-ray 11/9  also concerning for pneumonia, however patient has no pulmonary symptoms  Large bilateral hematosalpinx Noted on transvaginal US - GYN has evaluated   Hepatosplenomegaly Felt to likely be due to passive congestion in the setting of acute heart failure   Acute anemia / Fe Deficiency anemia S/p 5U PRBC total - Hgb stabilizing / improving - cont to follow trend   Recent Labs Lab 12/19/15 0237 12/20/15 0214 12/22/15 0345 12/23/15 0429 12/25/15 0430  HGB 8.0* 9.0* 8.6* 8.8* 10.3*    Hypokalemia Repleted  Hypomagnesemia -corrected   DVT prophylaxis: SCDs Code Status: FULL CODE Family Communication: Spoke with mother at bedside  Disposition Plan: Not Stable for discharge, urology consult  [facility]  [facility]  [facility]   Urology ID  Procedures: 10/31 CT abdomen pelvis WO contrast - enlarged hydrosalpinges (.infection vs blood) -Left kidney;suspected hydronephrosis. - Hepatosplenomegaly. - Small volume free fluid within the abdomen and pelvis with mild diffuse anasarca, suspected  Intrinsic liver disease. - Enlarged periaortic adenopathy  11/2 TTE EF 40% to 45%. Mild diffuse hypokinesis     Antimicrobials:  Zosyn 10/31 > 11/2 Vancomycin 10/31 > 11/2 Rocephin 11/3 > 11/6 Ceftin 11/7 >  Objective: Blood pressure 123/71, pulse 69, temperature 100 F (37.8 C), resp. rate 15, height 5\' 4"  (1.626 m), weight 58.2  kg (128 lb 6.4 oz), last menstrual period 12/22/2015, SpO2 100 %, unknown if currently breastfeeding.  Intake/Output Summary (Last 24 hours) at 12/25/15 0903 Last data filed at 12/25/15 0500  Gross per 24 hour  Intake              930 ml  Output                0 ml  Net              930 ml   Filed Weights   12/23/15 0522 12/24/15 0543 12/25/15 0500  Weight: 59.7 kg (131 lb 11.2 oz) 59.4 kg (131 lb) 58.2 kg (128 lb 6.4 oz)    Examination: General: No acute respiratory distress - alert  Lungs: Clear to auscultation bilaterally  Cardiovascular: Regular rate and rhythm without murmur  Abdomen: Mildly tender diffusely, nondistended, soft, bowel sounds positive, no rebound, no ascites, no appreciable mass Extremities: No significant cyanosis, clubbing, edema bilateral lower extremities  CBC:  Recent Labs Lab 12/19/15 0237 12/20/15 0214 12/22/15 0345 12/23/15 0429 12/25/15 0430  WBC 7.8 10.0 9.2 8.1 10.7*  HGB 8.0* 9.0* 8.6* 8.8* 10.3*  HCT 24.3* 28.2* 27.7* 28.1* 32.3*  MCV 76.9* 77.5* 79.6 79.4 79.8  PLT 182 225 236 267 350     Basic Metabolic Panel:  Recent Labs Lab 12/19/15 0237 12/20/15 0214 12/22/15 0345 12/23/15 0429 12/24/15 0346  NA 131* 132* 132* 134* 134*  K 3.4* 3.7 4.1 4.2 4.2  CL 95* 96* 101 103 103  CO2 25 23 24 24 23   GLUCOSE 90 91 92 91 96  BUN 25* 26* 18 17 15   CREATININE 2.08* 1.87* 1.54* 1.42* 1.37*  CALCIUM 8.4* 8.7* 8.6* 8.8* 8.9  PHOS  --  4.4  --   --   --    GFR: Estimated Creatinine Clearance: 52.3 mL/min (by C-G formula based on SCr of 1.37 mg/dL (H)).  Liver Function Tests:  Recent Labs Lab 12/20/15 0214 12/22/15 0345 12/23/15 0429  AST 23 22 20   ALT 14 14 13*  ALKPHOS 140* 133* 141*  BILITOT 1.5* 1.5* 1.2  PROT 7.8 6.8 7.2  ALBUMIN 1.9*  1.9* 1.8* 2.0*    Coagulation Profile: No results for input(s): INR, PROTIME in the last 168 hours.  Recent Results (from the past 240 hour(s))  Culture, blood (routine x 2)      Status: None (Preliminary result)   Collection Time: 12/23/15 11:52 AM  Result Value Ref Range Status   Specimen Description BLOOD RIGHT ANTECUBITAL  Final   Special Requests BOTTLES DRAWN AEROBIC ONLY 5CC  Final   Culture  Setup Time   Final    GRAM POSITIVE COCCI IN CLUSTERS AEROBIC BOTTLE ONLY CRITICAL RESULT CALLED TO, READ BACK BY AND VERIFIED WITH: CALLED PHARM T RUDISILL AT 1945 MARTINB [phone]    Culture GRAM POSITIVE COCCI  Final   Report Status PENDING  Incomplete  Blood Culture ID Panel (Reflexed)     Status: Abnormal   Collection Time: 12/23/15 11:52 AM  Result Value Ref Range Status   Enterococcus species NOT DETECTED NOT DETECTED Final   Listeria monocytogenes NOT DETECTED NOT DETECTED Final   Staphylococcus species DETECTED (A) NOT DETECTED Final    Comment: CRITICAL RESULT CALLED  TO, READ BACK BY AND VERIFIED WITH: CALLED PHARM T RUDISILL AT 1945 MARTINB [phone]    Staphylococcus aureus NOT DETECTED NOT DETECTED Final   Methicillin resistance DETECTED (A) NOT DETECTED Final    Comment: CRITICAL RESULT CALLED TO, READ BACK BY AND VERIFIED WITH: CALLED PHARM T RUDISILL AT 1945 MARTINB [phone]    Streptococcus species NOT DETECTED NOT DETECTED Final   Streptococcus agalactiae NOT DETECTED NOT DETECTED Final   Streptococcus pneumoniae NOT DETECTED NOT DETECTED Final   Streptococcus pyogenes NOT DETECTED NOT DETECTED Final   Acinetobacter baumannii NOT DETECTED NOT DETECTED Final   Enterobacteriaceae species NOT DETECTED NOT DETECTED Final   Enterobacter cloacae complex NOT DETECTED NOT DETECTED Final   Escherichia coli NOT DETECTED NOT DETECTED Final   Klebsiella oxytoca NOT DETECTED NOT DETECTED Final   Klebsiella pneumoniae NOT DETECTED NOT DETECTED Final   Proteus species NOT DETECTED NOT DETECTED Final   Serratia marcescens NOT DETECTED NOT DETECTED Final   Haemophilus influenzae NOT DETECTED NOT DETECTED Final   Neisseria meningitidis NOT DETECTED NOT  DETECTED Final   Pseudomonas aeruginosa NOT DETECTED NOT DETECTED Final   Candida albicans NOT DETECTED NOT DETECTED Final   Candida glabrata NOT DETECTED NOT DETECTED Final   Candida krusei NOT DETECTED NOT DETECTED Final   Candida parapsilosis NOT DETECTED NOT DETECTED Final   Candida tropicalis NOT DETECTED NOT DETECTED Final  Culture, blood (routine x 2)     Status: None (Preliminary result)   Collection Time: 12/23/15 11:58 AM  Result Value Ref Range Status   Specimen Description BLOOD RIGHT ARM  Final   Special Requests IN PEDIATRIC BOTTLE 2.5CC  Final   Culture NO GROWTH < 24 HOURS  Final   Report Status PENDING  Incomplete     Scheduled Meds: . carvedilol  3.125 mg Oral BID WC  . cefUROXime  500 mg Oral BID WC  . famotidine  20 mg Oral Daily  . feeding supplement (ENSURE ENLIVE)  237 mL Oral Q24H  . ferrous sulfate  325 mg Oral BID WC  . LORazepam  1 mg Intravenous Once  . senna-docusate  1 tablet Oral BID  . vancomycin  500 mg Intravenous Q12H  . vitamin C  500 mg Oral BID     LOS: 11 days     [facility] Office  [phone] Pager - Text Page per Loretha Stapler as per below:  On-Call/Text Page:      [email or web address]      password [email or web address]  If 7PM-7AM, please contact night-coverage [email or web address] Password [email or web address] 12/25/2015, 9:03 AM

## 2015-12-25 NOTE — Consult Note (Signed)
[facility]    Date of Admission:  12/13/2015   Total days of antibiotics 13        Day 6 cefuroxime        Day 2 vancomycin              Reason for Consult: Intermittent fevers    Referring Physician: Dr. Richarda OverlieNayana Abrol  Principal Problem:   Fever Active Problems:   Shock (HCC)   AKI (acute kidney injury) (HCC)   Jaundice   Hepatosplenomegaly   Anemia, iron deficiency   Sepsis (HCC)   Hydronephrosis, left   Peripartum cardiomyopathy   Acute combined systolic and diastolic heart failure (HCC)   Acute renal failure (HCC)   Cardiomyopathy (HCC)   UTI due to Klebsiella species   Hydrosalpinx   . carvedilol  3.125 mg Oral BID WC  . cefUROXime  500 mg Oral BID WC  . famotidine  20 mg Oral Daily  . feeding supplement (ENSURE ENLIVE)  237 mL Oral Q24H  . ferrous sulfate  325 mg Oral BID WC  . LORazepam  1 mg Intravenous Once  . senna-docusate  1 tablet Oral BID  . vancomycin  500 mg Intravenous Q12H  . vitamin C  500 mg Oral BID    Recommendations: 1. Continue cefuroxime pending further evaluation of left hydronephrosis 2. Discontinue vancomycin 3. Check CBC differential   Assessment: The cause of her fevers is unclear. There is still concern about her left hydronephrosis and she may have a complicated urinary tract infection relatively resistant to current antibiotic therapy. A CT scan has been ordered and she is being considered for ureteral stent placement. If a stent is placed and would collect more urine for repeat UA and culture. I do not see any other focal source for fever. I doubt she has tubo-ovarian abscess or pelvic septic thrombophlebitis. I do not see any evidence of drug allergy or IV site infection. If she does not have evidence of ongoing kidney infection I would consider stopping all antibiotics soon.    HPI: Carolyn Lynn is a 30 y.o. female G1P1 who had an uneventful normal spontaneous vaginal delivery on  11/13/2015. Over the next few weeks she developed fatigue, abdominal cramps, left lower back pain and shortness of breath. She was seen in the emergency room department on 12/04/2015. She had no urinary tract symptoms but a UA showed pyuria. She was discharged home on oral cephalexin. Her urine culture grew Klebsiella sensitive to cephalexin. She completed treatment on 12/09/2015 but was feeling worse. She came back to the emergency department on 12/13/2015 complaining of worsening fatigue, shortness of breath, abdominal and back pain, nausea and vomiting. She was found to be tachycardic and hypotensive. Abdominal ultrasound and CT scan showed bilateral hydrosalpinx, left hydronephrosis, hepatosplenomegaly and a small amount of ascites. She was also noted to have periaortic adenopathy. She was found to have cardiomyopathy, a hemoglobin of 4.9 and acute kidney injury. Urine culture grew the same Klebsiella again. She was started on empiric vancomycin and piperacillin tazobactam. This was changed to ceftriaxone once culture results came back then changed to cefuroxime 6 days ago.  She was afebrile initially but began having intermittent fevers on 12/17/2015. Overall she is feeling much better. She is no longer short of breath when walking. She still has occasional pain in her left upper back and left lower back but it has improved.  Review of Systems: Review of  Systems  Constitutional: Positive for chills, fever and malaise/fatigue. Negative for diaphoresis and weight loss.  HENT: Negative for congestion and sore throat.   Respiratory: Negative for cough, sputum production and shortness of breath.   Cardiovascular: Negative for chest pain.  Gastrointestinal: Negative for abdominal pain, constipation, diarrhea, heartburn, nausea and vomiting.  Genitourinary: Positive for flank pain. Negative for dysuria and frequency.  Musculoskeletal: Positive for back pain. Negative for joint pain and myalgias.  Skin:  Negative for itching and rash.  Neurological: Positive for weakness. Negative for dizziness and headaches.  Psychiatric/Behavioral: Negative for depression and substance abuse. The patient is not nervous/anxious.     Past Medical History:  Diagnosis Date  . Medical history non-contributory     Social History  Substance Use Topics  . Smoking status: Never Smoker  . Smokeless tobacco: Never Used  . Alcohol use No    History reviewed. No pertinent family history. No Known Allergies  OBJECTIVE: Blood pressure 107/65, pulse 64, temperature 97.8 F (36.6 C), resp. rate 16, height 5\' 4"  (1.626 m), weight 128 lb 6.4 oz (58.2 kg), last menstrual period 12/22/2015, SpO2 98 %, unknown if currently breastfeeding.  Physical Exam  Constitutional: She is oriented to person, place, and time.  She is smiling and in good spirits. She is sitting up in a chair.  HENT:  Mouth/Throat: No oropharyngeal exudate.  Eyes: Conjunctivae are normal.  Neck: Neck supple.  Cardiovascular: Normal rate and regular rhythm.   No murmur heard. Pulmonary/Chest: Effort normal and breath sounds normal. She has no wheezes. She has no rales.  Abdominal: Soft. She exhibits no mass. There is no tenderness.  No CVA or suprapubic tenderness.  Musculoskeletal: Normal range of motion. She exhibits no edema or tenderness.  Neurological: She is alert and oriented to person, place, and time.  Skin: No rash noted.  Former and current IV sites look good.  Psychiatric: Mood and affect normal.    Lab Results Lab Results  Component Value Date   WBC 10.7 (H) 12/25/2015   HGB 10.3 (L) 12/25/2015   HCT 32.3 (L) 12/25/2015   MCV 79.8 12/25/2015   PLT 350 12/25/2015    Lab Results  Component Value Date   CREATININE 1.40 (H) 12/25/2015   BUN 14 12/25/2015   NA 134 (L) 12/25/2015   K 4.2 12/25/2015   CL 104 12/25/2015   CO2 21 (L) 12/25/2015    Lab Results  Component Value Date   ALT 13 (L) 12/23/2015   AST 20  12/23/2015   ALKPHOS 141 (H) 12/23/2015   BILITOT 1.2 12/23/2015     Microbiology: Recent Results (from the past 240 hour(s))  Culture, blood (routine x 2)     Status: None (Preliminary result)   Collection Time: 12/23/15 11:52 AM  Result Value Ref Range Status   Specimen Description BLOOD RIGHT ANTECUBITAL  Final   Special Requests BOTTLES DRAWN AEROBIC ONLY 5CC  Final   Culture  Setup Time   Final    GRAM POSITIVE COCCI IN CLUSTERS AEROBIC BOTTLE ONLY CRITICAL RESULT CALLED TO, READ BACK BY AND VERIFIED WITH: CALLED PHARM T RUDISILL AT 1945 MARTINB [id number]    Culture   Final    GRAM POSITIVE COCCI CULTURE REINCUBATED FOR BETTER GROWTH    Report Status PENDING  Incomplete  Blood Culture ID Panel (Reflexed)     Status: Abnormal   Collection Time: 12/23/15 11:52 AM  Result Value Ref Range Status   Enterococcus species NOT DETECTED NOT DETECTED  Final   Listeria monocytogenes NOT DETECTED NOT DETECTED Final   Staphylococcus species DETECTED (A) NOT DETECTED Final    Comment: CRITICAL RESULT CALLED TO, READ BACK BY AND VERIFIED WITH: CALLED PHARM T RUDISILL AT 1945 MARTINB [phone]    Staphylococcus aureus NOT DETECTED NOT DETECTED Final   Methicillin resistance DETECTED (A) NOT DETECTED Final    Comment: CRITICAL RESULT CALLED TO, READ BACK BY AND VERIFIED WITH: CALLED PHARM T RUDISILL AT 1945 MARTINB [phone]    Streptococcus species NOT DETECTED NOT DETECTED Final   Streptococcus agalactiae NOT DETECTED NOT DETECTED Final   Streptococcus pneumoniae NOT DETECTED NOT DETECTED Final   Streptococcus pyogenes NOT DETECTED NOT DETECTED Final   Acinetobacter baumannii NOT DETECTED NOT DETECTED Final   Enterobacteriaceae species NOT DETECTED NOT DETECTED Final   Enterobacter cloacae complex NOT DETECTED NOT DETECTED Final   Escherichia coli NOT DETECTED NOT DETECTED Final   Klebsiella oxytoca NOT DETECTED NOT DETECTED Final   Klebsiella pneumoniae NOT DETECTED NOT DETECTED Final     Proteus species NOT DETECTED NOT DETECTED Final   Serratia marcescens NOT DETECTED NOT DETECTED Final   Haemophilus influenzae NOT DETECTED NOT DETECTED Final   Neisseria meningitidis NOT DETECTED NOT DETECTED Final   Pseudomonas aeruginosa NOT DETECTED NOT DETECTED Final   Candida albicans NOT DETECTED NOT DETECTED Final   Candida glabrata NOT DETECTED NOT DETECTED Final   Candida krusei NOT DETECTED NOT DETECTED Final   Candida parapsilosis NOT DETECTED NOT DETECTED Final   Candida tropicalis NOT DETECTED NOT DETECTED Final  Culture, blood (routine x 2)     Status: None (Preliminary result)   Collection Time: 12/23/15 11:58 AM  Result Value Ref Range Status   Specimen Description BLOOD RIGHT ARM  Final   Special Requests IN PEDIATRIC BOTTLE 2.5CC  Final   Culture NO GROWTH < 24 HOURS  Final   Report Status PENDING  Incomplete    Carolyn AstersJohn Windy Dudek, MD [facility] [phone] pager   [phone] cell 12/25/2015, 1:47 PM

## 2015-12-25 NOTE — Progress Notes (Signed)
Patient ID: Rosita KeaJervita J Roulhac, female   DOB: 07/25/1985, 30 y.o.   MRN: [id number]     Subjective: Pt with fever to 102.7 last night.  No increase in left flank pain although she describes some pain in left scapular region.  Now improved.  No nausea or vomiting.  Objective: Vital signs in last 24 hours: Temp:  [98.6 F (37 C)-102.7 F (39.3 C)] 100 F (37.8 C) (11/12 0500) Pulse Rate:  [66-76] 69 (11/12 0500) Resp:  [15-23] 15 (11/12 0500) BP: (104-123)/(59-71) 123/71 (11/12 0500) SpO2:  [98 %-100 %] 100 % (11/12 0500) Weight:  [58.2 kg (128 lb 6.4 oz)] 58.2 kg (128 lb 6.4 oz) (11/12 0500)  Intake/Output from previous day: 11/11 0701 - 11/12 0700 In: 930 [P.O.:680] Out: -  Intake/Output this shift: No intake/output data recorded.  Physical Exam:  General: Alert and oriented Abdomen: Minimal left CVAT unchanged from yesterday.  Lab Results:  Recent Labs  12/23/15 0429 12/25/15 0430  HGB 8.8* 10.3*  HCT 28.1* 32.3*   CBC Latest Ref Rng & Units 12/25/2015 12/23/2015 12/22/2015  WBC 4.0 - 10.5 K/uL 10.7(H) 8.1 9.2  Hemoglobin 12.0 - 15.0 g/dL 10.3(L) 8.8(L) 8.6(L)  Hematocrit 36.0 - 46.0 % 32.3(L) 28.1(L) 27.7(L)  Platelets 150 - 400 K/uL 350 267 236     BMET  Recent Labs  12/23/15 0429 12/24/15 0346  NA 134* 134*  K 4.2 4.2  CL 103 103  CO2 24 23  GLUCOSE 91 96  BUN 17 15  CREATININE 1.42* 1.37*  CALCIUM 8.8* 8.9     Studies/Results: [facility]  Result Date: 12/23/2015 CLINICAL DATA:  Patient with fever. EXAM: RENAL / URINARY TRACT ULTRASOUND COMPLETE COMPARISON:  Renal ultrasound 12/15/2015. FINDINGS: Right Kidney: Length: 13.1 cm. Echogenicity within normal limits. No mass or hydronephrosis visualized. Left Kidney: Length: 12.3 cm. Normal renal cortical thickness. There is moderate left hydronephrosis. Bladder: Echogenic debris demonstrated within the bladder lumen. IMPRESSION: Moderate left hydronephrosis. Echogenic debris within the bladder lumen.  Electronically Signed   By: Annia Beltrew  Davis M.D.   On: 12/23/2015 17:44    Assessment/Plan: 1) Left pyelonephritis/left hydronephrosis:  Considering increased temperature and increasing WBC over last 24 hours despite appropriate antibiotic therapy, I now have increased concern of more complex urinary tract infection.  Her renal function was not checked this morning and I have ordered a BMP.  If her renal function is ok, I would recommend a CT of the abdomen and pelvis with IV contrast and delayed renal images this morning to evaluate for possible renal abscess or development of worsening obstruction that could require ureteral stent drainage.  Will keep NPO now pending CT results for possible stent placement later today if necessary.   LOS: 11 days   Holger Sokolowski,LES 12/25/2015, 8:18 AM

## 2015-12-25 NOTE — Progress Notes (Signed)
Patient ID: Carolyn Lynn, female   DOB: 06/15/1985, 30 y.o.   MRN: [id number]   Pt still pending CT scan as of 5 PM today.  Have given order to allow patient to eat as she has been stable and afebrile today.  Will review CT once done and proceed with intervention tomorrow if indicated.

## 2015-12-25 NOTE — Progress Notes (Signed)
Clarified with Infection Prevention that patient does not need to be on contact precautions at this time.

## 2015-12-25 NOTE — Progress Notes (Signed)
Pt refusing MRI. Pt educated on the importance of MRI. MD paged. Will cont to monitor pt.

## 2015-12-26 DIAGNOSIS — R5081 Fever presenting with conditions classified elsewhere: Secondary | ICD-10-CM

## 2015-12-26 DIAGNOSIS — R509 Fever, unspecified: Secondary | ICD-10-CM

## 2015-12-26 DIAGNOSIS — I420 Dilated cardiomyopathy: Secondary | ICD-10-CM

## 2015-12-26 LAB — COMPREHENSIVE METABOLIC PANEL
ALK PHOS: 128 U/L — AB (ref 38–126)
ALT: 13 U/L — ABNORMAL LOW (ref 14–54)
ANION GAP: 11 (ref 5–15)
AST: 17 U/L (ref 15–41)
Albumin: 2.3 g/dL — ABNORMAL LOW (ref 3.5–5.0)
BILIRUBIN TOTAL: 1.2 mg/dL (ref 0.3–1.2)
BUN: 13 mg/dL (ref 6–20)
CALCIUM: 9.2 mg/dL (ref 8.9–10.3)
CO2: 21 mmol/L — ABNORMAL LOW (ref 22–32)
Chloride: 102 mmol/L (ref 101–111)
Creatinine, Ser: 1.32 mg/dL — ABNORMAL HIGH (ref 0.44–1.00)
GFR, EST NON AFRICAN AMERICAN: 54 mL/min — AB (ref >60)
Glucose, Bld: 91 mg/dL (ref 65–99)
POTASSIUM: 3.9 mmol/L (ref 3.5–5.1)
Sodium: 134 mmol/L — ABNORMAL LOW (ref 135–145)
TOTAL PROTEIN: 7.4 g/dL (ref 6.5–8.1)

## 2015-12-26 LAB — CBC
HEMATOCRIT: 27.7 % — AB (ref 36.0–46.0)
HEMOGLOBIN: 8.4 g/dL — AB (ref 12.0–15.0)
MCH: 24.4 pg — ABNORMAL LOW (ref 26.0–34.0)
MCHC: 30.3 g/dL (ref 30.0–36.0)
MCV: 80.5 fL (ref 78.0–100.0)
Platelets: 327 10*3/uL (ref 150–400)
RBC: 3.44 MIL/uL — AB (ref 3.87–5.11)
RDW: 18.5 % — ABNORMAL HIGH (ref 11.5–15.5)
WBC: 7.4 10*3/uL (ref 4.0–10.5)

## 2015-12-26 LAB — CULTURE, BLOOD (ROUTINE X 2)

## 2015-12-26 MED ORDER — CARVEDILOL 3.125 MG PO TABS
3.1250 mg | ORAL_TABLET | Freq: Two times a day (BID) | ORAL | Status: DC
  Administered 2015-12-26 – 2015-12-27 (×2): 3.125 mg via ORAL
  Filled 2015-12-26 (×2): qty 1

## 2015-12-26 NOTE — Evaluation (Signed)
Physical Therapy Evaluation Patient Details Name: Carolyn Lynn MRN: [id number] DOB: 03/02/1985 Today's Date: 12/26/2015   History of Present Illness  30 yo female with readmission after childbirth a month ago, now has fever and UTI.  Pt had tachycardia, shocky, metabolic acidosis ascites.    Clinical Impression  Pt was assessed for mobility with biggest issue her LE strength.  However, she has not had mobiltiy issues prior to this and likely should resolve while here.  Will follow acutely to add stairs in and strengthen her legs, then will also assess if an AD is needed.  I do not expect her to need one by DC.      Follow Up Recommendations No PT follow up;Supervision - Intermittent    Equipment Recommendations  None recommended by PT (will reassess after stairs to see if AD needed)    Recommendations for Other Services       Precautions / Restrictions Precautions Precautions: Fall (telemetry) Precaution Comments: watch O2 sats Restrictions Weight Bearing Restrictions: No      Mobility  Bed Mobility               General bed mobility comments: up when PT arrived  Transfers Overall transfer level: Modified independent Equipment used: 1 person hand held assist (for safety)             General transfer comment: reminders for hand placement  Ambulation/Gait Ambulation/Gait assistance: Min guard Ambulation Distance (Feet): 110 Feet Assistive device: 1 person hand held assist Gait Pattern/deviations: Step-through pattern;Wide base of support;Trunk flexed;Decreased stride length Gait velocity: decreased Gait velocity interpretation: Below normal speed for age/gender General Gait Details: has limited speed and controlled distance to avoid declining O2 sats since her values decreased wtih walking  Stairs Stairs:  (deferred to tomorrow)          Wheelchair Mobility    Modified Rankin (Stroke Patients Only)       Balance Overall balance assessment:  Needs assistance Sitting-balance support: Feet supported Sitting balance-Leahy Scale: Good     Standing balance support: Single extremity supported Standing balance-Leahy Scale: Fair Standing balance comment: no LOB with standing but is correcting herself                             Pertinent Vitals/Pain Pain Assessment: 0-10 Pain Score: 8  Pain Location: back Pain Descriptors / Indicators: Aching Pain Intervention(s): Monitored during session;Premedicated before session;Repositioned;Patient requesting pain meds-RN notified;Ice applied    Home Living Family/patient expects to be discharged to:: Private residence Living Arrangements: Parent Available Help at Discharge: Family;Available 24 hours/day Type of Home: Apartment Home Access: Stairs to enter Entrance Stairs-Rails: Right;Left;Can reach both Entrance Stairs-Number of Steps: 13 Home Layout: One level Home Equipment: None      Prior Function Level of Independence: Independent               Hand Dominance        Extremity/Trunk Assessment   Upper Extremity Assessment: Overall WFL for tasks assessed           Lower Extremity Assessment: Generalized weakness      Cervical / Trunk Assessment: Normal  Communication   Communication: No difficulties  Cognition Arousal/Alertness: Awake/alert Behavior During Therapy: WFL for tasks assessed/performed Overall Cognitive Status: Within Functional Limits for tasks assessed                      General Comments General  comments (skin integrity, edema, etc.): Pt had telemetry on and sats declined to 91% with walk then back to 97% after sitting down    Exercises     Assessment/Plan    PT Assessment Patient needs continued PT services  PT Problem List Decreased strength;Decreased range of motion;Decreased activity tolerance;Decreased balance;Decreased mobility;Decreased coordination;Cardiopulmonary status limiting activity;Pain           PT Treatment Interventions DME instruction;Gait training;Stair training;Functional mobility training;Therapeutic activities;Balance training;Therapeutic exercise;Neuromuscular re-education;Patient/family education    PT Goals (Current goals can be found in the Care Plan section)  Acute Rehab PT Goals Patient Stated Goal: to get home PT Goal Formulation: With patient Time For Goal Achievement: 01/09/16 Potential to Achieve Goals: Good    Frequency Min 3X/week   Barriers to discharge Inaccessible home environment flight of steps to enter her apt    Co-evaluation               End of Session   Activity Tolerance: Patient limited by fatigue;Treatment limited secondary to medical complications (Comment) (elevating pulse to 102 from 74 at rest and O2 sats dropped ) Patient left: in chair;with call bell/phone within reach Nurse Communication: Mobility status         Time: [id number] PT Time Calculation (min) (ACUTE ONLY): 24 min   Charges:   PT Evaluation $PT Eval Low Complexity: 1 Procedure PT Treatments $Gait Training: 8-22 mins   PT G CodesIvar Drape:        Orey Moure E 12/26/2015, 6:24 PM   Samul Dadauth Rozena Fierro, PT MS Acute Rehab Dept. Number: [facility] [phone] and [facility] [phone]

## 2015-12-26 NOTE — Progress Notes (Signed)
[place] TEAM 1 - Stepdown/ICU TEAM  Carolyn Lynn  YNW:[id number] DOB: 09/02/1985 DOA: 12/13/2015 PCP: No PCP Per Patient    Brief Narrative:  30 year old F w/ Hx of childbirth 11/13/2015 who presented to the hospital with back pain, shortness of breath, fatigue, feet swelling and abdominal distention. On exam she was hypotensive and tachycardic. Patient's blood pressures were consistently in the 70s and 80s. Patient's blood work showed a Hgb of 4.9. CMP with new acute kidney injury with her creatinine of 2.2.   Subjective:  Patient continues to have a low-grade fever  Assessment & Plan: Acute systolic CHF - EF 40-45% -?peripartum CM - Cards has evaluated - to have f/u TTE in 2-3 months - no ACEi/ARB due to AKI - BB initiated per Cards today - not volume overloaded on exam . Continue Coreg She needs post hospital follow-up with cardiology to follow her left ventricular function. Decisions about whether she can become pregnant again will have to be made very carefully. LV dysfunction was caused as a postpartum problem.   Persistent fever-unclear etiology Likely source is urine, CT scan on 10/31 showed bilateral hydrosalpinges Mild left hydronephrosis could be secondary to enlarged hydrosalpinges or UPJ stone Patient has had a positive urine culture. Klebsiella pneumonia and has fever despite being on cefuroxime Repeated  blood cultures 11/10 one out of 2 bottles positive for staph epi, vancomycin added to her antibiotic regimen 11/11-11/12, discontinued by infectious disease  Patient unable to do MRI of the lumbar spine to rule out discitis, reattempted but patient declined to do an MRI of the back  renal ultrasound  showed moderate left hydronephrosis Urology consulted, given increase in the temperature, CT scan ordered for possible stent placement , as per urology , patient has stable left hydronephrosis, no renal abscess, may need a left ureteral stent placement, but patient hesitant  to proceed Continue cefuroxime . Infectious disease following    Hypotension / septic Shock  -due to hypovolemia, UTI, cardiogenic shock, and hemorrhage - BP has stabilized - Cardiology added Coreg   Acute Renal failure - Mild L Hydronephrosis -secondary to UTI and hypotensive shock - mild hydro felt to be due to hematosalpinx - crt cont to trend down at this time - follow - avoid nephrotoxins   Recent Labs Lab 12/22/15 0345 12/23/15 0429 12/24/15 0346 12/25/15 0905 12/26/15 0537  CREATININE 1.54* 1.42* 1.37* 1.40* 1.32*    Klebsiella pneumoniae UTI/HCAP? Chest x-ray 11/9  also concerning for pneumonia, however patient has no pulmonary symptoms  Large bilateral hematosalpinx Noted on transvaginal US - GYN has evaluated   Hepatosplenomegaly Felt to likely be due to passive congestion in the setting of acute heart failure   Acute anemia / Fe Deficiency anemia S/p 5U PRBC total - Hgb stabilizing / improving - cont to follow trend   Recent Labs Lab 12/20/15 0214 12/22/15 0345 12/23/15 0429 12/25/15 0430 12/26/15 0537  HGB 9.0* 8.6* 8.8* 10.3* 8.4*    Hypokalemia Repleted  Hypomagnesemia -corrected   DVT prophylaxis: SCDs Code Status: FULL CODE Family Communication: Spoke with mother at bedside  Disposition Plan: Not Stable for discharge,Continues to have fever   Consultants:  [facility]  [facility]   Urology ID  Procedures: 10/31 CT abdomen pelvis WO contrast - enlarged hydrosalpinges (.infection vs blood) -Left kidney;suspected hydronephrosis. - Hepatosplenomegaly. - Small volume free fluid within the abdomen and pelvis with mild diffuse anasarca, suspected  Intrinsic liver disease. - Enlarged periaortic adenopathy  11/2  TTE EF 40% to 45%. Mild diffuse hypokinesis     Antimicrobials:  Zosyn 10/31 > 11/2 Vancomycin 10/31 > 11/2 Rocephin 11/3 > 11/6 Ceftin 11/7 >  Objective: Blood pressure 104/68, pulse (!) 57,  temperature 98.7 F (37.1 C), resp. rate 13, height 5\' 4"  (1.626 m), weight 58.1 kg (128 lb 1.6 oz), last menstrual period 12/22/2015, SpO2 100 %, unknown if currently breastfeeding.  Intake/Output Summary (Last 24 hours) at 12/26/15 0855 Last data filed at 12/26/15 0600  Gross per 24 hour  Intake              605 ml  Output                0 ml  Net              605 ml   Filed Weights   12/24/15 0543 12/25/15 0500 12/26/15 0547  Weight: 59.4 kg (131 lb) 58.2 kg (128 lb 6.4 oz) 58.1 kg (128 lb 1.6 oz)    Examination: General: No acute respiratory distress - alert  Lungs: Clear to auscultation bilaterally  Cardiovascular: Regular rate and rhythm without murmur  Abdomen: Mildly tender diffusely, nondistended, soft, bowel sounds positive, no rebound, no ascites, no appreciable mass Extremities: No significant cyanosis, clubbing, edema bilateral lower extremities  CBC:  Recent Labs Lab 12/20/15 0214 12/22/15 0345 12/23/15 0429 12/25/15 0430 12/25/15 1220 12/26/15 0537  WBC 10.0 9.2 8.1 10.7*  --  7.4  NEUTROABS  --   --   --   --  8.0*  --   HGB 9.0* 8.6* 8.8* 10.3*  --  8.4*  HCT 28.2* 27.7* 28.1* 32.3*  --  27.7*  MCV 77.5* 79.6 79.4 79.8  --  80.5  PLT 225 236 267 350  --  327     Basic Metabolic Panel:  Recent Labs Lab 12/20/15 0214 12/22/15 0345 12/23/15 0429 12/24/15 0346 12/25/15 0905 12/26/15 0537  NA 132* 132* 134* 134* 134* 134*  K 3.7 4.1 4.2 4.2 4.2 3.9  CL 96* 101 103 103 104 102  CO2 23 24 24 23  21* 21*  GLUCOSE 91 92 91 96 112* 91  BUN 26* 18 17 15 14 13   CREATININE 1.87* 1.54* 1.42* 1.37* 1.40* 1.32*  CALCIUM 8.7* 8.6* 8.8* 8.9 9.1 9.2  PHOS 4.4  --   --   --   --   --    GFR: Estimated Creatinine Clearance: 54.3 mL/min (by C-G formula based on SCr of 1.32 mg/dL (H)).  Liver Function Tests:  Recent Labs Lab 12/20/15 0214 12/22/15 0345 12/23/15 0429 12/26/15 0537  AST 23 22 20 17   ALT 14 14 13* 13*  ALKPHOS 140* 133* 141* 128*    BILITOT 1.5* 1.5* 1.2 1.2  PROT 7.8 6.8 7.2 7.4  ALBUMIN 1.9*  1.9* 1.8* 2.0* 2.3*    Coagulation Profile: No results for input(s): INR, PROTIME in the last 168 hours.  Recent Results (from the past 240 hour(s))  Culture, blood (routine x 2)     Status: Abnormal (Preliminary result)   Collection Time: 12/23/15 11:52 AM  Result Value Ref Range Status   Specimen Description BLOOD RIGHT ANTECUBITAL  Final   Special Requests BOTTLES DRAWN AEROBIC ONLY 5CC  Final   Culture  Setup Time   Final    GRAM POSITIVE COCCI IN CLUSTERS AEROBIC BOTTLE ONLY CRITICAL RESULT CALLED TO, READ BACK BY AND VERIFIED WITH: CALLED PHARM T RUDISILL AT 1945 MARTINB [id number]  Culture (A)  Final    STAPHYLOCOCCUS SPECIES (COAGULASE NEGATIVE) THE SIGNIFICANCE OF ISOLATING THIS ORGANISM FROM A SINGLE SET OF BLOOD CULTURES WHEN MULTIPLE SETS ARE DRAWN IS UNCERTAIN. PLEASE NOTIFY THE MICROBIOLOGY DEPARTMENT WITHIN ONE WEEK IF SPECIATION AND SENSITIVITIES ARE REQUIRED.    Report Status PENDING  Incomplete  Blood Culture ID Panel (Reflexed)     Status: Abnormal   Collection Time: 12/23/15 11:52 AM  Result Value Ref Range Status   Enterococcus species NOT DETECTED NOT DETECTED Final   Listeria monocytogenes NOT DETECTED NOT DETECTED Final   Staphylococcus species DETECTED (A) NOT DETECTED Final    Comment: CRITICAL RESULT CALLED TO, READ BACK BY AND VERIFIED WITH: CALLED PHARM T RUDISILL AT 1945 MARTINB [id number]    Staphylococcus aureus NOT DETECTED NOT DETECTED Final   Methicillin resistance DETECTED (A) NOT DETECTED Final    Comment: CRITICAL RESULT CALLED TO, READ BACK BY AND VERIFIED WITH: CALLED PHARM T RUDISILL AT 1945 MARTINB [id number]    Streptococcus species NOT DETECTED NOT DETECTED Final   Streptococcus agalactiae NOT DETECTED NOT DETECTED Final   Streptococcus pneumoniae NOT DETECTED NOT DETECTED Final   Streptococcus pyogenes NOT DETECTED NOT DETECTED Final   Acinetobacter baumannii NOT  DETECTED NOT DETECTED Final   Enterobacteriaceae species NOT DETECTED NOT DETECTED Final   Enterobacter cloacae complex NOT DETECTED NOT DETECTED Final   Escherichia coli NOT DETECTED NOT DETECTED Final   Klebsiella oxytoca NOT DETECTED NOT DETECTED Final   Klebsiella pneumoniae NOT DETECTED NOT DETECTED Final   Proteus species NOT DETECTED NOT DETECTED Final   Serratia marcescens NOT DETECTED NOT DETECTED Final   Haemophilus influenzae NOT DETECTED NOT DETECTED Final   Neisseria meningitidis NOT DETECTED NOT DETECTED Final   Pseudomonas aeruginosa NOT DETECTED NOT DETECTED Final   Candida albicans NOT DETECTED NOT DETECTED Final   Candida glabrata NOT DETECTED NOT DETECTED Final   Candida krusei NOT DETECTED NOT DETECTED Final   Candida parapsilosis NOT DETECTED NOT DETECTED Final   Candida tropicalis NOT DETECTED NOT DETECTED Final  Culture, blood (routine x 2)     Status: None (Preliminary result)   Collection Time: 12/23/15 11:58 AM  Result Value Ref Range Status   Specimen Description BLOOD RIGHT ARM  Final   Special Requests IN PEDIATRIC BOTTLE 2.5CC  Final   Culture NO GROWTH 2 DAYS  Final   Report Status PENDING  Incomplete     Scheduled Meds: . carvedilol  3.125 mg Oral BID WC  . cefUROXime  500 mg Oral BID WC  . famotidine  20 mg Oral Daily  . feeding supplement (ENSURE ENLIVE)  237 mL Oral Q24H  . ferrous sulfate  325 mg Oral BID WC  . LORazepam  1 mg Intravenous Once  . senna-docusate  1 tablet Oral BID  . vitamin C  500 mg Oral BID     LOS: 12 days     [facility] Office  [phone] Pager - Text Page per Loretha Stapler as per below:  On-Call/Text Page:      [email or web address]      password [email or web address]  If 7PM-7AM, please contact night-coverage [email or web address] Password [email or web address] 12/26/2015, 8:55 AM

## 2015-12-26 NOTE — Progress Notes (Signed)
Patient ID: Carolyn Lynn, female   DOB: 09/10/1985, 30 y.o.   MRN: [id number]    Subjective: Pt with no new complaints.  Denies significant flank pain.  CT scan finally performed late last night.  Objective: Vital signs in last 24 hours: Temp:  [97.8 F (36.6 C)-100.1 F (37.8 C)] 98.7 F (37.1 C) (11/13 0547) Pulse Rate:  [57-69] 57 (11/13 0547) Resp:  [13-17] 13 (11/13 0547) BP: (104-113)/(62-75) 104/68 (11/13 0547) SpO2:  [98 %-100 %] 100 % (11/13 0547) Weight:  [58.1 kg (128 lb 1.6 oz)] 58.1 kg (128 lb 1.6 oz) (11/13 0547)  Intake/Output from previous day: 11/12 0701 - 11/13 0700 In: 605 [P.O.:605] Out: -  Intake/Output this shift: No intake/output data recorded.  Physical Exam:  General: Alert and oriented Abd: No CVAT currently  Lab Results:  Recent Labs  12/25/15 0430 12/26/15 0537  HGB 10.3* 8.4*  HCT 32.3* 27.7*   CBC Latest Ref Rng & Units 12/26/2015 12/25/2015 12/23/2015  WBC 4.0 - 10.5 K/uL 7.4 10.7(H) 8.1  Hemoglobin 12.0 - 15.0 g/dL 4.0(J8.4(L) 10.3(L) 8.8(L)  Hematocrit 36.0 - 46.0 % 27.7(L) 32.3(L) 28.1(L)  Platelets 150 - 400 K/uL 327 350 267     BMET  Recent Labs  12/25/15 0905 12/26/15 0537  NA 134* 134*  K 4.2 3.9  CL 104 102  CO2 21* 21*  GLUCOSE 112* 91  BUN 14 13  CREATININE 1.40* 1.32*  CALCIUM 9.1 9.2     Studies/Results: Ct Abdomen Pelvis W Contrast  Result Date: 12/25/2015 CLINICAL DATA:  Follow-up left-sided hydronephrosis noted on renal ultrasound. Assess for underlying abscess or obstruction. Initial encounter. EXAM: CT ABDOMEN AND PELVIS WITH CONTRAST TECHNIQUE: Multidetector CT imaging of the abdomen and pelvis was performed using the standard protocol following bolus administration of intravenous contrast. CONTRAST:  80 mL ISOVUE-300 IOPAMIDOL (ISOVUE-300) INJECTION 61% COMPARISON:  CT of the abdomen and pelvis performed 12/13/2015, pelvic ultrasound performed 12/14/2015, and renal ultrasound performed 12/23/2015  FINDINGS: Lower chest: Mild bibasilar opacities likely reflect atelectasis or scarring. The visualized portions of the mediastinum are unremarkable. Hepatobiliary: Scattered hypodensities within the liver are nonspecific, measuring up to 1.6 cm in size. The liver is otherwise unremarkable. The gallbladder is within normal limits. The common bile duct remains normal in caliber. Pancreas: The pancreas is within normal limits. Spleen: The spleen is somewhat bulky, but borderline normal in size. Adrenals/Urinary Tract: The adrenal glands are grossly unremarkable in appearance. Moderate chronic left-sided hydronephrosis is again noted, slightly more prominent than on prior CT from 2 weeks ago. This likely reflects mass effect on the distal left ureter due to the patient's very large left-sided hematosalpinx. Foci of mildly decreased attenuation along the anterior aspect of the right kidney may reflect mild focal pyelonephritis, without a drainable abscess. Small left renal cysts are seen. Stomach/Bowel: The stomach is unremarkable in appearance. The small bowel is within normal limits. The appendix is not visualized; there is no evidence for appendicitis. The colon is unremarkable in appearance, though difficult to fully assess due to surrounding structures. Vascular/Lymphatic: The abdominal aorta is unremarkable in appearance. The inferior vena cava is grossly unremarkable. No retroperitoneal lymphadenopathy is seen. No pelvic sidewall lymphadenopathy is identified. Reproductive: The uterus is grossly unremarkable in appearance. Large bilateral hematosalpinx is noted, larger on the left, as on the prior studies. The bladder is mildly distended and grossly unremarkable. Other: No additional soft tissue abnormalities are seen. Musculoskeletal: No acute osseous abnormalities are identified. The visualized musculature is unremarkable  in appearance. IMPRESSION: 1. Moderate chronic left-sided hydronephrosis again noted,  slightly more prominent than on prior CT from 2 weeks ago. This likely reflects mass effect on the distal left ureter due to the patient's very large left-sided hematosalpinx. No obstructing stone seen. 2. Large bilateral hematosalpinx noted, larger on the left, similar to the prior studies. 3. Foci of mildly decreased attenuation along the anterior aspect of the right kidney may reflect mild focal pyelonephritis, without a definite drainable abscess. 4. Scattered small left renal cysts seen. 5. Mild bibasilar airspace opacities likely reflect atelectasis or scarring. 6. Nonspecific scattered hyperdensities again noted within the liver, measuring up to 1.6 cm in size. Electronically Signed   By: Roanna RaiderJeffery  Chang M.D.   On: 12/25/2015 23:31    Assessment/Plan: Left hydronephrosis/left pyelonephritis:  I have reviewed her CT scan from last night.  Still with stable left hydronephrosis likely consistent with partial but unlikely to be complete obstruction of the left kidney.  No renal abscess identified.  Only source of obstruction appears to be hydro- vs hemato- salpinx.  I discussed findings with patient and reviewed option of proceeding with left ureteral stent placement, which she has a relative but not absolute indication for.  Benefit of stent placement would be to absolutely rule out ureteral obstruction as reason for unresolving infection.  However, her fever curve is improved last 24 hours and WBC is again normal and so she has no absolute indication for intervention.  While my recommendation was that she strongly consider ureteral stenting today to help from a diagnostic standpoint and to potentially help her resolve her situation, she is quite hesitant and ultimately has decided not to proceed.  She understands that I cannot perform this procedure tomorrow and Wednesday would be the next opportunity I would have to place a ureteral stent if she continues to have fever.  She also understands that if she  spikes a higher fever or becomes clinically unstable that I would recommend she proceed with this procedure more urgently.  She expressed her understanding.    Considering that the only source for obstruction would be her hydro/hemato- salpinx, I think it would be valuable to have further GYN input about management.  Will this simply require spontaneous resolution or would intervention be indicated?  If this is expected to resolve spontaneously, how long should we expect before resolution and will she need follow up imaging, etc?   LOS: 12 days   Seher Schlagel,LES 12/26/2015, 7:10 AM

## 2015-12-26 NOTE — Progress Notes (Signed)
[facility]   Reason for visit: Follow up on fever  Interval History: no fever since 11/11 pm; no complaints.   Physical Exam: Constitutional:  Vitals:   12/26/15 0949 12/26/15 1330  BP: 105/65 102/63  Pulse: 69 98  Resp: (!) 29 18  Temp:  99.3 F (37.4 C)   patient appears in NAD; up at sink and washing Respiratory: Normal respiratory effort; CTA B Cardiovascular: RRR GI: soft  Review of Systems: Constitutional: negative for fevers and chills Gastrointestinal: negative for diarrhea Integument/breast: negative for rash  Lab Results  Component Value Date   WBC 7.4 12/26/2015   HGB 8.4 (L) 12/26/2015   HCT 27.7 (L) 12/26/2015   MCV 80.5 12/26/2015   PLT 327 12/26/2015    Lab Results  Component Value Date   CREATININE 1.32 (H) 12/26/2015   BUN 13 12/26/2015   NA 134 (L) 12/26/2015   K 3.9 12/26/2015   CL 102 12/26/2015   CO2 21 (L) 12/26/2015    Lab Results  Component Value Date   ALT 13 (L) 12/26/2015   AST 17 12/26/2015   ALKPHOS 128 (H) 12/26/2015     Microbiology: Recent Results (from the past 240 hour(s))  Culture, blood (routine x 2)     Status: Abnormal   Collection Time: 12/23/15 11:52 AM  Result Value Ref Range Status   Specimen Description BLOOD RIGHT ANTECUBITAL  Final   Special Requests BOTTLES DRAWN AEROBIC ONLY 5CC  Final   Culture  Setup Time   Final    GRAM POSITIVE COCCI IN CLUSTERS AEROBIC BOTTLE ONLY CRITICAL RESULT CALLED TO, READ BACK BY AND VERIFIED WITH: CALLED PHARM T RUDISILL AT 1945 MARTINB [phone]    Culture (A)  Final    STAPHYLOCOCCUS SPECIES (COAGULASE NEGATIVE) THE SIGNIFICANCE OF ISOLATING THIS ORGANISM FROM A SINGLE SET OF BLOOD CULTURES WHEN MULTIPLE SETS ARE DRAWN IS UNCERTAIN. PLEASE NOTIFY THE MICROBIOLOGY DEPARTMENT WITHIN ONE WEEK IF SPECIATION AND SENSITIVITIES ARE REQUIRED.    Report Status 12/26/2015 FINAL  Final  Blood Culture ID Panel (Reflexed)     Status: Abnormal   Collection Time: 12/23/15 11:52 AM  Result Value Ref Range Status   Enterococcus species NOT DETECTED NOT DETECTED Final   Listeria monocytogenes NOT DETECTED NOT DETECTED Final   Staphylococcus species DETECTED (A) NOT DETECTED Final    Comment: CRITICAL RESULT CALLED TO, READ BACK BY AND VERIFIED WITH: CALLED PHARM T RUDISILL AT 1945 MARTINB [phone]    Staphylococcus aureus NOT DETECTED NOT DETECTED Final   Methicillin resistance DETECTED (A) NOT DETECTED Final    Comment: CRITICAL RESULT CALLED TO, READ BACK BY AND VERIFIED WITH: CALLED PHARM T RUDISILL AT 1945 MARTINB [phone]    Streptococcus species NOT DETECTED NOT DETECTED Final   Streptococcus agalactiae NOT DETECTED NOT DETECTED Final   Streptococcus pneumoniae NOT DETECTED NOT DETECTED Final   Streptococcus pyogenes NOT DETECTED NOT DETECTED Final   Acinetobacter baumannii NOT DETECTED NOT DETECTED Final   Enterobacteriaceae species NOT DETECTED NOT DETECTED Final   Enterobacter cloacae complex NOT DETECTED NOT DETECTED Final   Escherichia coli NOT DETECTED NOT DETECTED Final   Klebsiella oxytoca NOT DETECTED NOT DETECTED Final   Klebsiella pneumoniae NOT DETECTED NOT DETECTED Final   Proteus species NOT DETECTED NOT DETECTED Final   Serratia marcescens NOT DETECTED NOT DETECTED Final   Haemophilus influenzae NOT DETECTED NOT DETECTED Final   Neisseria meningitidis NOT DETECTED NOT DETECTED Final   Pseudomonas aeruginosa NOT  DETECTED NOT DETECTED Final   Candida albicans NOT DETECTED NOT DETECTED Final   Candida glabrata NOT DETECTED NOT DETECTED Final   Candida krusei NOT DETECTED NOT DETECTED Final   Candida parapsilosis NOT DETECTED NOT DETECTED Final   Candida tropicalis NOT DETECTED NOT DETECTED Final  Culture, blood (routine x 2)     Status: None (Preliminary result)   Collection Time: 12/23/15 11:58 AM  Result Value Ref Range Status   Specimen Description BLOOD RIGHT ARM  Final   Special Requests IN PEDIATRIC  BOTTLE 2.5CC  Final   Culture NO GROWTH 2 DAYS  Final   Report Status PENDING  Incomplete    Impression/Plan:  1. Fever - no obvious source though pyelonephritis possible.  Did not want stent at this time.  I will have her continue cefuroxime for now and if she remains afebrile will stop.   2. aki - improved creat today

## 2015-12-27 LAB — BASIC METABOLIC PANEL
ANION GAP: 7 (ref 5–15)
BUN: 13 mg/dL (ref 6–20)
CO2: 23 mmol/L (ref 22–32)
Calcium: 8.9 mg/dL (ref 8.9–10.3)
Chloride: 102 mmol/L (ref 101–111)
Creatinine, Ser: 1.23 mg/dL — ABNORMAL HIGH (ref 0.44–1.00)
GFR, EST NON AFRICAN AMERICAN: 59 mL/min — AB (ref >60)
GLUCOSE: 93 mg/dL (ref 65–99)
POTASSIUM: 4.1 mmol/L (ref 3.5–5.1)
Sodium: 132 mmol/L — ABNORMAL LOW (ref 135–145)

## 2015-12-27 MED ORDER — ENSURE ENLIVE PO LIQD: 237.0000 mL | ORAL | 12 refills | Status: DC

## 2015-12-27 NOTE — Discharge Summary (Signed)
Physician Discharge Summary  Carolyn Lynn MRN: [id number] DOB/AGE: [birth year] 30 y.o.  PCP: No PCP Per Patient   Admit date: 12/13/2015 Discharge date: 12/27/2015  Discharge Diagnoses:    Principal Problem:   Fever Active Problems:   Shock ([place])   AKI (acute kidney injury) ([place])   Jaundice   Hepatosplenomegaly   Anemia, iron deficiency   Sepsis ([place])   Hydronephrosis, left   Peripartum cardiomyopathy   Acute combined systolic and diastolic heart failure (HCC)   Acute renal failure (HCC)   Cardiomyopathy ([place])   UTI due to Klebsiella species   Hydrosalpinx    Follow-up recommendations Follow-up with PCP in 3-5 days , including all  additional recommended appointments as below Follow-up CBC, CMP in 3-5 days Patient needs to follow-up with cardiology, 2-D echo recommended in 2-3 months Patient needs to follow-up with gynecology for bilateral hydrosalpinx Patient needs to follow-up with urology for follow-up of her left hydronephrosis      Current Discharge Medication List    START taking these medications   Details  carvedilol (COREG) 3.125 MG tablet Take 1 tablet (3.125 mg total) by mouth 2 (two) times daily with a meal. Qty: 60 tablet, Refills: 1    famotidine (PEPCID) 20 MG tablet Take 1 tablet (20 mg total) by mouth daily. Qty: 30 tablet, Refills: 1    feeding supplement, ENSURE ENLIVE, (ENSURE ENLIVE) LIQD Take 237 mLs by mouth daily. Qty: 237 mL, Refills: 12      CONTINUE these medications which have NOT CHANGED   Details  docusate sodium (COLACE) 100 MG capsule Take 1 capsule (100 mg total) by mouth every 12 (twelve) hours. Qty: 60 capsule, Refills: 0    ferrous sulfate 325 (65 FE) MG tablet Take 325 mg by mouth 3 (three) times daily. Refills: 5    Prenatal Vit-Fe Fumarate-FA (PRENATAL MULTIVITAMIN) TABS tablet Take 1 tablet by mouth daily.       STOP taking these medications     ibuprofen (ADVIL,MOTRIN) 200 MG tablet          Discharge  Condition: Overall condition guarded  Discharge Instructions Get Medicines reviewed and adjusted: Please take all your medications with you for your next visit with your Primary MD  Please request your Primary MD to go over all hospital tests and procedure/radiological results at the follow up, please ask your Primary MD to get all Hospital records sent to his/her office.  If you experience worsening of your admission symptoms, develop shortness of breath, life threatening emergency, suicidal or homicidal thoughts you must seek medical attention immediately by calling 911 or calling your MD immediately if symptoms less severe.  You must read complete instructions/literature along with all the possible adverse reactions/side effects for all the Medicines you take and that have been prescribed to you. Take any new Medicines after you have completely understood and accpet all the possible adverse reactions/side effects.   Do not drive when taking Pain medications.   Do not take more than prescribed Pain, Sleep and Anxiety Medications  Special Instructions: If you have smoked or chewed Tobacco in the last 2 yrs please stop smoking, stop any regular Alcohol and or any Recreational drug use.  Wear Seat belts while driving.  Please note  You were cared for by a hospitalist during your hospital stay. Once you are discharged, your primary care physician will handle any further medical issues. Please note that NO REFILLS for any discharge medications will be authorized once you are discharged,  as it is imperative that you return to your primary care physician (or establish a relationship with a primary care physician if you do not have one) for your aftercare needs so that they can reassess your need for medications and monitor your lab values.     No Known Allergies    Disposition: 01-Home or Self Care   Consults: *  Consultants:  [facility]  [facility]    Urology ID  Significant Diagnostic Studies:  Ct Abdomen Pelvis Wo Contrast  Result Date: 12/13/2015 CLINICAL DATA:  Initial evaluation for acute abdominal pain. Status post recent vaginal delivery. EXAM: CT ABDOMEN AND PELVIS WITHOUT CONTRAST TECHNIQUE: Multidetector CT imaging of the abdomen and pelvis was performed following the standard protocol without IV contrast. COMPARISON:  None available. FINDINGS: Lower chest: Mild scattered atelectatic changes present within the visualized lung bases. Visualized lung bases are otherwise clear. Diffusely decreased density within the cardiac blood pool suggestive of anemia. Changes related to pregnancy noted within the bilateral breasts. Hepatobiliary: 16 mm hypodensity within the posterior right hepatic lobe present, most consistent with a cyst. Liver itself is somewhat enlarged. Limited noncontrast evaluation of the liver is otherwise unremarkable. Gallbladder within normal limits. No biliary dilatation. Pancreas: Noncontrast evaluation the pancreas is unremarkable. Spleen: Spleen is enlarged measuring 16.2 cm in craniocaudad dimension. Adrenals/Urinary Tract: Adrenal glands are grossly unremarkable. The left kidney is somewhat prominent as compared to the right with question of mild hydronephrosis. This may be related to the ongoing pelvic process. No nephrolithiasis bilaterally. Right kidney within normal limits. No definite hydroureter. Partially distended bladder unremarkable. Stomach/Bowel: Stomach within normal limits. No evidence for bowel obstruction. No acute inflammatory changes seen about the bowels. Vascular/Lymphatic: Limited evaluation of the vascular structures on this noncontrast examination. Question enlarged periaortic lymph nodes measuring up to 15 mm (series 2, image 34). No other definite adenopathy on this noncontrast examination. Reproductive: Possible small fibroid arising from the uterine fundus noted (series 2, image 58). There are  bilateral somewhat tubular cystic lesions within both adnexa, measuring 14.2 x 5.7 cm on the right and 12.6 x 6.7 cm on the left. These lesions measure low height and intermediate fluid density (approximately 20-25 Hounsfield units). Ovaries themselves not well delineated. Findings are suspected to reflect hydrosalpinges. Ovarian malignancy felt unlikely given the bilateral symmetric nature of this finding and patient age. Other: No free intraperitoneal air. Small volume free fluid within the abdomen and pelvis. Musculoskeletal: Mild diffuse anasarca noted. No acute osseous abnormality. No worrisome lytic or blastic osseous lesions. IMPRESSION: 1. Enlarged tubular cystic structures involving the bilateral adnexa as above, suspected to reflect enlarged hydrosalpinges. These structures demonstrate low to intermediate fluid density, raising the possibility that some component of proteinaceous material (as may be expected with infection or possibly blood) may be present. Further evaluation with dedicated pelvic ultrasound may be helpful to further evaluate this finding. 2. Mild prominence of the left kidney with suspected mild left hydronephrosis. Finding may be secondary to the enlarged hydrosalpinges. Superimposed infection could also be considered. Correlation with urinalysis recommended. 3. Hepatosplenomegaly. 4. Small volume free fluid within the abdomen and pelvis with mild diffuse anasarca, suspected to be related to underlying intrinsic liver disease. 5. Enlarged periaortic adenopathy as above, not well delineated on this noncontrast examination. 6. Decreased density within the cardiac blood pool, suggesting anemia. Electronically Signed   By: Jeannine Boga M.D.   On: 12/13/2015 20:45   Dg Chest 2 View  Result Date: 12/22/2015  CLINICAL DATA:  Fever for 9 days. EXAM: CHEST  2 VIEW COMPARISON:  12/16/2015 FINDINGS: There is persistent opacity of both lung bases. Appearance is more disc wide in may  represent atelectasis more than infiltrate at this time. There has been some improvement in aeration. Small bilateral pleural effusions are noted. IMPRESSION: Some improvement in aeration. Persistent bibasilar opacity consistent with atelectasis or resolving infiltrates. Small residual effusions. Electronically Signed   By: Nolon Nations M.D.   On: 12/22/2015 13:56   US Transvaginal Non-ob  Result Date: 12/14/2015 CLINICAL DATA:  Ultrasound was provided for use by the ordering physician, and a technical charge was applied by the performing facility.  No radiologist interpretation/professional services rendered.   US Pelvis Complete  Result Date: 12/14/2015 CLINICAL DATA:  4 weeks postpartum, severe anemia, adnexal masses on CT EXAM: TRANSABDOMINAL AND TRANSVAGINAL ULTRASOUND OF PELVIS TECHNIQUE: Both transabdominal and transvaginal ultrasound examinations of the pelvis were performed. Transabdominal technique was performed for global imaging of the pelvis including uterus, ovaries, adnexal regions, and pelvic cul-de-sac. It was necessary to proceed with endovaginal exam following the transabdominal exam to visualize the bilateral adnexa. COMPARISON:  CT abdomen/pelvis dated 12/13/2015 FINDINGS: Uterus Measurements: 16.4 x 6.3 x 7.8 cm. No fibroids or other mass visualized. Endometrium Thickness: 17 mm.  No focal abnormality visualized. Right ovary Not discretely visualized. 14.5 x 7.3 x 8.6 cm complex cystic/tubular lesion with layering hemorrhage/debris in the right adnexa, favored to reflect hematosalpinx. Left ovary Not discretely visualized. 14.7 x 8.5 x 6.7 cm complex cystic/tubular lesion with layering hemorrhage/debris in the left adnexa, favored to reflect hematosalpinx. Other findings No abnormal free fluid. IMPRESSION: Large bilateral hematosalpinx, as above. Endometrial complex measures 17 mm, within normal limits. No gas on CT or ultrasound to suggest infection/endometritis. Postpartum  hematosalpinx can be seen in the setting of uterine scarring related to instrumentation or infection, but it is unclear that either of those scenarios applying to this patient. Additionally, given the clinical picture and CT findings, an underlying coagulopathy (possibly related to hepatic dysfunction) is suspected, although the exact etiology remains unclear on imaging. These results were called by telephone at the time of interpretation on 12/14/2015 at 11:30 am to Dr. Jeannetta Nap, who verbally acknowledged these results. Electronically Signed   By: Julian Hy M.D.   On: 12/14/2015 12:16   Ct Abdomen Pelvis W Contrast  Result Date: 12/25/2015 CLINICAL DATA:  Follow-up left-sided hydronephrosis noted on renal ultrasound. Assess for underlying abscess or obstruction. Initial encounter. EXAM: CT ABDOMEN AND PELVIS WITH CONTRAST TECHNIQUE: Multidetector CT imaging of the abdomen and pelvis was performed using the standard protocol following bolus administration of intravenous contrast. CONTRAST:  80 mL ISOVUE-300 IOPAMIDOL (ISOVUE-300) INJECTION 61% COMPARISON:  CT of the abdomen and pelvis performed 12/13/2015, pelvic ultrasound performed 12/14/2015, and renal ultrasound performed 12/23/2015 FINDINGS: Lower chest: Mild bibasilar opacities likely reflect atelectasis or scarring. The visualized portions of the mediastinum are unremarkable. Hepatobiliary: Scattered hypodensities within the liver are nonspecific, measuring up to 1.6 cm in size. The liver is otherwise unremarkable. The gallbladder is within normal limits. The common bile duct remains normal in caliber. Pancreas: The pancreas is within normal limits. Spleen: The spleen is somewhat bulky, but borderline normal in size. Adrenals/Urinary Tract: The adrenal glands are grossly unremarkable in appearance. Moderate chronic left-sided hydronephrosis is again noted, slightly more prominent than on prior CT from 2 weeks ago. This likely reflects mass  effect on the distal left ureter due to the patient's very  large left-sided hematosalpinx. Foci of mildly decreased attenuation along the anterior aspect of the right kidney may reflect mild focal pyelonephritis, without a drainable abscess. Small left renal cysts are seen. Stomach/Bowel: The stomach is unremarkable in appearance. The small bowel is within normal limits. The appendix is not visualized; there is no evidence for appendicitis. The colon is unremarkable in appearance, though difficult to fully assess due to surrounding structures. Vascular/Lymphatic: The abdominal aorta is unremarkable in appearance. The inferior vena cava is grossly unremarkable. No retroperitoneal lymphadenopathy is seen. No pelvic sidewall lymphadenopathy is identified. Reproductive: The uterus is grossly unremarkable in appearance. Large bilateral hematosalpinx is noted, larger on the left, as on the prior studies. The bladder is mildly distended and grossly unremarkable. Other: No additional soft tissue abnormalities are seen. Musculoskeletal: No acute osseous abnormalities are identified. The visualized musculature is unremarkable in appearance. IMPRESSION: 1. Moderate chronic left-sided hydronephrosis again noted, slightly more prominent than on prior CT from 2 weeks ago. This likely reflects mass effect on the distal left ureter due to the patient's very large left-sided hematosalpinx. No obstructing stone seen. 2. Large bilateral hematosalpinx noted, larger on the left, similar to the prior studies. 3. Foci of mildly decreased attenuation along the anterior aspect of the right kidney may reflect mild focal pyelonephritis, without a definite drainable abscess. 4. Scattered small left renal cysts seen. 5. Mild bibasilar airspace opacities likely reflect atelectasis or scarring. 6. Nonspecific scattered hyperdensities again noted within the liver, measuring up to 1.6 cm in size. Electronically Signed   By: Garald Balding M.D.    On: 12/25/2015 23:31   US Renal  Result Date: 12/23/2015 CLINICAL DATA:  Patient with fever. EXAM: RENAL / URINARY TRACT ULTRASOUND COMPLETE COMPARISON:  Renal ultrasound 12/15/2015. FINDINGS: Right Kidney: Length: 13.1 cm. Echogenicity within normal limits. No mass or hydronephrosis visualized. Left Kidney: Length: 12.3 cm. Normal renal cortical thickness. There is moderate left hydronephrosis. Bladder: Echogenic debris demonstrated within the bladder lumen. IMPRESSION: Moderate left hydronephrosis. Echogenic debris within the bladder lumen. Electronically Signed   By: Lovey Newcomer M.D.   On: 12/23/2015 17:44   US Renal  Result Date: 12/15/2015 CLINICAL DATA:  Left hydronephrosis noted on the recent CT. Large bilateral hematosalpinx noted on pelvic ultrasound yesterday. EXAM: RENAL / URINARY TRACT ULTRASOUND COMPLETE COMPARISON:  CT, 11/1928 17.  Pelvic ultrasound, 12/14/2015. FINDINGS: Right Kidney: Length: 13.5 cm. Borderline increased renal parenchymal echogenicity. No mass or stone. Mild dilation of the renal pelvis without caliectasis. Left Kidney: Length: 13.9 cm. Normal parenchymal echogenicity. No mass or stone. Mild hydronephrosis. The Bladder: No bladder mass, stone or wall thickening. Neither ureteral jet was visualized. Spleen measures 14.2 cm longest dimension. Complex pelvic masses consistent with bilateral hematosalpinx is stable from the previous day's ultrasound. IMPRESSION: 1. Mild left hydronephrosis. Mild dilation of the right renal pelvis. This is likely due to the pelvic masses/hematosalpinxes noted on the current and previous day's ultrasound. No renal masses or stones. Electronically Signed   By: Lajean Manes M.D.   On: 12/15/2015 13:06   [place] Art/ven Flow Abd Pelv Doppler  Result Date: 12/14/2015 CLINICAL DATA:  Hepatomegaly. EXAM: DUPLEX ULTRASOUND OF LIVER TECHNIQUE: Color and duplex Doppler ultrasound was performed to evaluate the hepatic in-flow and out-flow vessels.  COMPARISON:  Same day. FINDINGS: Portal Vein Velocities Main:  42.9 cm/sec Right:  42.2 cm/sec Left:  43.7 cm/sec Hepatopetal flow is noted. Hepatic Vein Velocities Right:  42.2 cm/sec Middle:  54.1 cm/sec Left:  61.8 cm/sec  Hepatofugal flow is noted Hepatic Artery Velocity:  155.5 cm/sec Splenic Vein Velocity:  63.6 cm/sec Varices: None. Ascites: Minimal. Moderate splenomegaly is noted with maximum measured length of 18 cm and calculated volume of 1500 cubic cm. IMPRESSION: No evidence of portal, hepatic or splenic thrombosis or occlusion. Moderate splenomegaly. Minimal ascites. Electronically Signed   By: Marijo Conception, M.D.   On: 12/14/2015 12:28   Dg Chest Port 1 View  Result Date: 12/16/2015 CLINICAL DATA:  Shortness of breath when lying down EXAM: PORTABLE CHEST 1 VIEW COMPARISON:  12/15/2015 FINDINGS: Mild pulmonary vascular congestion.  No frank interstitial edema. Small bilateral pleural effusions.  No pneumothorax. Cardiomegaly. IMPRESSION: Cardiomegaly with small bilateral pleural effusions. Mild pulmonary vascular congestion.  No frank interstitial edema. Electronically Signed   By: Julian Hy M.D.   On: 12/16/2015 09:23   Dg Chest Port 1 View  Result Date: 12/15/2015 CLINICAL DATA:  Tachypnea EXAM: PORTABLE CHEST 1 VIEW COMPARISON:  12/13/2015 FINDINGS: The patient has developed abnormal density at both lung bases that could be a combination of volume loss, pneumonia and pleural fluid. Upper lungs remain clear. IMPRESSION: Development of abnormal density at both lung bases which could be a combination of atelectasis, pneumonia and pleural fluid. Electronically Signed   By: Nelson Chimes M.D.   On: 12/15/2015 16:18   Dg Chest Portable 1 View  Result Date: 12/13/2015 CLINICAL DATA:  Shortness of breath for 2 days with central back pain EXAM: PORTABLE CHEST 1 VIEW COMPARISON:  None. FINDINGS: Cardiac shadow is at the upper limits of normal in size. The lungs are well aerated  bilaterally. Mild bibasilar atelectatic changes are seen. No focal confluent infiltrate is noted. No bony abnormality is seen. IMPRESSION: Mild bibasilar atelectatic changes. Electronically Signed   By: Inez Catalina M.D.   On: 12/13/2015 18:08   US Abdomen Limited Ruq  Result Date: 12/14/2015 CLINICAL DATA:  Hepatomegaly. EXAM: US ABDOMEN LIMITED - RIGHT UPPER QUADRANT COMPARISON:  CT scan of December 13, 2015. FINDINGS: Gallbladder: No gallstones are noted. Possible sludge is noted within gallbladder lumen. Moderate gallbladder wall thickening is noted measured at 5.8 mm. No pericholecystic fluid or sonographic Murphy's sign is noted. Common bile duct: Diameter: 2.5 mm which is within normal limits. Liver: 1.5 cm simple cyst is seen in right hepatic lobe. Liver appears to be enlarged. Within normal limits in parenchymal echogenicity. IMPRESSION: Probable sludge seen within gallbladder lumen with moderate gallbladder wall thickening. This may be due to adjacent hepatocellular disease. No gallstones are noted. Liver appears to be enlarged. Electronically Signed   By: Marijo Conception, M.D.   On: 12/14/2015 11:52    2-D echo LV EF: 40% -   45%  ------------------------------------------------------------------- Indications:      Dyspnea 786.09.  ------------------------------------------------------------------- History:   PMH:  Recent Pregnancy. No prior cardiac history.  ------------------------------------------------------------------- Study Conclusions  - Left ventricle: The cavity size was mildly dilated. Systolic   function was mildly to moderately reduced. The estimated ejection   fraction was in the range of 40% to 45%. Mild diffuse hypokinesis   with no identifiable regional variations. Left ventricular   diastolic function parameters were mostly normal. Only the medial   annulus diastolic velocity was mildly reduced. - Pericardium, extracardiac: A trivial pericardial effusion  was   identified.   Filed Weights   12/25/15 0500 12/26/15 0547 12/27/15 0500  Weight: 58.2 kg (128 lb 6.4 oz) 58.1 kg (128 lb 1.6 oz) 58.1 kg (128 lb)  Microbiology: Recent Results (from the past 240 hour(s))  Culture, blood (routine x 2)     Status: Abnormal   Collection Time: 12/23/15 11:52 AM  Result Value Ref Range Status   Specimen Description BLOOD RIGHT ANTECUBITAL  Final   Special Requests BOTTLES DRAWN AEROBIC ONLY 5CC  Final   Culture  Setup Time   Final    GRAM POSITIVE COCCI IN CLUSTERS AEROBIC BOTTLE ONLY CRITICAL RESULT CALLED TO, READ BACK BY AND VERIFIED WITH: CALLED PHARM T RUDISILL AT [place] [id number]    Culture (A)  Final    STAPHYLOCOCCUS SPECIES (COAGULASE NEGATIVE) THE SIGNIFICANCE OF ISOLATING THIS ORGANISM FROM A SINGLE SET OF BLOOD CULTURES WHEN MULTIPLE SETS ARE DRAWN IS UNCERTAIN. PLEASE NOTIFY THE MICROBIOLOGY DEPARTMENT WITHIN ONE WEEK IF SPECIATION AND SENSITIVITIES ARE REQUIRED.    Report Status 12/26/2015 FINAL  Final  Blood Culture ID Panel (Reflexed)     Status: Abnormal   Collection Time: 12/23/15 11:52 AM  Result Value Ref Range Status   Enterococcus species NOT DETECTED NOT DETECTED Final   Listeria monocytogenes NOT DETECTED NOT DETECTED Final   Staphylococcus species DETECTED (A) NOT DETECTED Final    Comment: CRITICAL RESULT CALLED TO, READ BACK BY AND VERIFIED WITH: CALLED PHARM T RUDISILL AT [address] MARTINB [id number]    Staphylococcus aureus NOT DETECTED NOT DETECTED Final   Methicillin resistance DETECTED (A) NOT DETECTED Final    Comment: CRITICAL RESULT CALLED TO, READ BACK BY AND VERIFIED WITH: CALLED PHARM T RUDISILL AT [address] MARTINB [id number]    Streptococcus species NOT DETECTED NOT DETECTED Final   Streptococcus agalactiae NOT DETECTED NOT DETECTED Final   Streptococcus pneumoniae NOT DETECTED NOT DETECTED Final   Streptococcus pyogenes NOT DETECTED NOT DETECTED Final   Acinetobacter baumannii NOT DETECTED NOT DETECTED  Final   Enterobacteriaceae species NOT DETECTED NOT DETECTED Final   Enterobacter cloacae complex NOT DETECTED NOT DETECTED Final   Escherichia coli NOT DETECTED NOT DETECTED Final   Klebsiella oxytoca NOT DETECTED NOT DETECTED Final   Klebsiella pneumoniae NOT DETECTED NOT DETECTED Final   Proteus species NOT DETECTED NOT DETECTED Final   Serratia marcescens NOT DETECTED NOT DETECTED Final   Haemophilus influenzae NOT DETECTED NOT DETECTED Final   Neisseria meningitidis NOT DETECTED NOT DETECTED Final   Pseudomonas aeruginosa NOT DETECTED NOT DETECTED Final   Candida albicans NOT DETECTED NOT DETECTED Final   Candida glabrata NOT DETECTED NOT DETECTED Final   Candida krusei NOT DETECTED NOT DETECTED Final   Candida parapsilosis NOT DETECTED NOT DETECTED Final   Candida tropicalis NOT DETECTED NOT DETECTED Final  Culture, blood (routine x 2)     Status: None (Preliminary result)   Collection Time: 12/23/15 11:58 AM  Result Value Ref Range Status   Specimen Description BLOOD RIGHT ARM  Final   Special Requests IN PEDIATRIC BOTTLE 2.5CC  Final   Culture NO GROWTH 3 DAYS  Final   Report Status PENDING  Incomplete       Blood Culture    Component Value Date/Time   SDES BLOOD RIGHT ARM 12/23/2015 1158   SPECREQUEST IN PEDIATRIC BOTTLE 2.5CC 12/23/2015 1158   CULT NO GROWTH 3 DAYS 12/23/2015 1158   REPTSTATUS PENDING 12/23/2015 1158      Labs: Results for orders placed or performed during the hospital encounter of 12/13/15 (from the past 48 hour(s))  Basic metabolic panel     Status: Abnormal   Collection Time: 12/25/15  9:05 AM  Result Value Ref Range  Sodium 134 (L) 135 - 145 mmol/L   Potassium 4.2 3.5 - 5.1 mmol/L   Chloride 104 101 - 111 mmol/L   CO2 21 (L) 22 - 32 mmol/L   Glucose, Bld 112 (H) 65 - 99 mg/dL   BUN 14 6 - 20 mg/dL   Creatinine, Ser 1.40 (H) 0.44 - 1.00 mg/dL   Calcium 9.1 8.9 - 10.3 mg/dL   GFR calc non Af Amer 50 (L) >60 mL/min   GFR calc Af Amer 58  (L) >60 mL/min    Comment: (NOTE) The eGFR has been calculated using the CKD EPI equation. This calculation has not been validated in all clinical situations. eGFR's persistently <60 mL/min signify possible Chronic Kidney Disease.    Anion gap 9 5 - 15  Differential     Status: Abnormal   Collection Time: 12/25/15 12:20 PM  Result Value Ref Range   Neutro Abs 8.0 (H) 1.7 - 7.7 K/uL   Lymphs Abs 2.1 0.7 - 4.0 K/uL   Monocytes Absolute 1.0 0.1 - 1.0 K/uL   Eosinophils Absolute 0.2 0.0 - 0.7 K/uL   Basophils Absolute 0.1 0.0 - 0.1 K/uL   Neutrophils Relative % 70 %   Lymphocytes Relative 19 %   Monocytes Relative 8 %   Eosinophils Relative 2 %   Basophils Relative 1 %   Smear Review MORPHOLOGY UNREMARKABLE   Comprehensive metabolic panel     Status: Abnormal   Collection Time: 12/26/15  5:37 AM  Result Value Ref Range   Sodium 134 (L) 135 - 145 mmol/L   Potassium 3.9 3.5 - 5.1 mmol/L   Chloride 102 101 - 111 mmol/L   CO2 21 (L) 22 - 32 mmol/L   Glucose, Bld 91 65 - 99 mg/dL   BUN 13 6 - 20 mg/dL   Creatinine, Ser 1.32 (H) 0.44 - 1.00 mg/dL   Calcium 9.2 8.9 - 10.3 mg/dL   Total Protein 7.4 6.5 - 8.1 g/dL   Albumin 2.3 (L) 3.5 - 5.0 g/dL   AST 17 15 - 41 U/L   ALT 13 (L) 14 - 54 U/L   Alkaline Phosphatase 128 (H) 38 - 126 U/L   Total Bilirubin 1.2 0.3 - 1.2 mg/dL   GFR calc non Af Amer 54 (L) >60 mL/min   GFR calc Af Amer >60 >60 mL/min    Comment: (NOTE) The eGFR has been calculated using the CKD EPI equation. This calculation has not been validated in all clinical situations. eGFR's persistently <60 mL/min signify possible Chronic Kidney Disease.    Anion gap 11 5 - 15  CBC     Status: Abnormal   Collection Time: 12/26/15  5:37 AM  Result Value Ref Range   WBC 7.4 4.0 - 10.5 K/uL   RBC 3.44 (L) 3.87 - 5.11 MIL/uL   Hemoglobin 8.4 (L) 12.0 - 15.0 g/dL   HCT 27.7 (L) 36.0 - 46.0 %   MCV 80.5 78.0 - 100.0 fL   MCH 24.4 (L) 26.0 - 34.0 pg   MCHC 30.3 30.0 - 36.0  g/dL   RDW 18.5 (H) 11.5 - 15.5 %   Platelets 327 150 - 400 K/uL  Basic metabolic panel     Status: Abnormal   Collection Time: 12/27/15  4:09 AM  Result Value Ref Range   Sodium 132 (L) 135 - 145 mmol/L   Potassium 4.1 3.5 - 5.1 mmol/L   Chloride 102 101 - 111 mmol/L   CO2 23 22 - 32  mmol/L   Glucose, Bld 93 65 - 99 mg/dL   BUN 13 6 - 20 mg/dL   Creatinine, Ser 1.23 (H) 0.44 - 1.00 mg/dL   Calcium 8.9 8.9 - 10.3 mg/dL   GFR calc non Af Amer 59 (L) >60 mL/min   GFR calc Af Amer >60 >60 mL/min    Comment: (NOTE) The eGFR has been calculated using the CKD EPI equation. This calculation has not been validated in all clinical situations. eGFR's persistently <60 mL/min signify possible Chronic Kidney Disease.    Anion gap 7 5 - 15     Lipid Panel  No results found for: CHOL, TRIG, HDL, CHOLHDL, VLDL, LDLCALC, LDLDIRECT   No results found for: HGBA1C   Lab Results  Component Value Date   CREATININE 1.23 (H) 12/27/2015     HPI :  Carolyn Lynn is a 30 y.o. female G1P1 who had an uneventful normal spontaneous vaginal delivery on 11/13/2015. Over the next few weeks she developed fatigue, abdominal cramps, left lower back pain and shortness of breath. She was seen in the emergency room department on 12/04/2015. She had no urinary tract symptoms but a UA showed pyuria. She was discharged home on oral cephalexin. Her urine culture grew Klebsiella sensitive to cephalexin. She completed treatment on 12/09/2015 but was feeling worse. She came back to the emergency department on 12/13/2015 complaining of worsening fatigue, shortness of breath, abdominal and back pain, nausea and vomiting. She was found to be tachycardic and hypotensive. Abdominal ultrasound and CT scan showed bilateral hydrosalpinx, left hydronephrosis, hepatosplenomegaly and a small amount of ascites. She was also noted to have periaortic adenopathy. 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 Urine culture grew the same Klebsiella again. She was started on empiric vancomycin and piperacillin tazobactam.  Consequently change to  ceftriaxone once culture results came back then changed to cefuroxime .  HOSPITAL COURSE:   Acute systolic CHF - EF 44-31% -?peripartum CM - Cards has evaluated - to have f/u TTE in 2-3 months - no ACEi/ARB due to AKI - BB initiated per Cards today - not volume overloaded on exam . Continue Coreg She needs post hospital follow-up with cardiology to follow her left ventricular function. Decisions about whether she can become pregnant again will have to be made very carefully. LV dysfunction was caused as a postpartum problem.   Persistent fever-improving fever curve,   Likely source is urine, CT scan on 10/31 showed bilateral hydrosalpinges Mild left hydronephrosis could be secondary to enlarged hydrosalpinges or UPJ stone Patient has had a positive urine culture. Klebsiella pneumonia and has fever despite being on cefuroxime Repeated  blood cultures 11/10 one out of 2 bottles positive for staph epi, vancomycin added to her antibiotic regimen 11/11-11/12, discontinued by infectious disease  Patient unable to do MRI of the lumbar spine to rule out discitis, reattempted but patient declined to do an MRI of the back  renal ultrasound  showed moderate left hydronephrosis Urology consulted, given increase in the temperature, CT scan ordered for possible stent placement , as per urology , patient has stable left hydronephrosis, no renal abscess, may need a left ureteral stent placement, but patient hesitant to proceed. Patient would need urology evaluation  if her fever spikes. She will need follow-up with gynecology for obstruction caused by the left hydrosalpinx and bilateral hydrosalpinx. Antibiotics will be discontinued before discharge-discussed with Dr Linus Salmons  prior to discharge  Hypotension / septic Shock  -due to hypovolemia, UTI,  cardiogenic shock, and hemorrhage - BP has stabilized - Cardiology added Coreg   Acute Renal failure - Mild L Hydronephrosis -secondary to UTI and hypotensive shock - mild hydro felt to be due to hematosalpinx - creatinine improved from 2.44 >1.23 - avoid nephrotoxins     Klebsiella pneumoniae UTI/HCAP? 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 Patient needs to follow-up with Dr. Kennon Rounds  Hepatosplenomegaly Felt to likely be due to passive congestion in the setting of acute heart failure   Acute anemia / Fe Deficiency anemia S/p 5U PRBC total - Hgb stabilizing / improving - cont to follow trend  Hemoglobin 8.4 on 11/13   Discharge Exam: *  Blood pressure 117/75, pulse 81, temperature 99.3 F (37.4 C), temperature source Oral, resp. rate 16, height 5' 4"  (1.626 m), weight 58.1 kg (128 lb), last menstrual period 12/22/2015, SpO2 99 %, unknown if currently breastfeeding. General: No acute respiratory distress - alert  Lungs: Clear to auscultation bilaterally  Cardiovascular: Regular rate and rhythm without murmur  Abdomen: Mildly tender diffusely, nondistended, soft, bowel sounds positive, no rebound, no ascites, no appreciable mass Extremities: No significant cyanosis, clubbing, edema bilateral lower extremities      Follow-up Information    PCP. Schedule an appointment as soon as possible for a visit in 2 day(s).   Why:  Follow-up in 3-5 days, hospital follow-up       Sherren Mocha, MD. Schedule an appointment as soon as possible for a visit in 2 day(s).   Specialty:  Cardiology Why:  Call cardiology to make hospital follow-up appointment in 1-2 months. You need 2-D echo in 2-3 months Contact information: [address] [place] [address] [phone]        Donnamae Jude, MD. Schedule an appointment as soon as possible for a visit.   Specialty:  Obstetrics and Gynecology Why:  Call gynecology to make follow-up appointments for bilateral hydrosalpinx Contact information: [place] [address] [phone]           Signed: Reyne Dumas 12/27/2015, 8:55 AM        Time spent >45 mins

## 2015-12-27 NOTE — Progress Notes (Signed)
D/C instruction education given to patient, verbalizes understanding, lengthy discussion regarding follow up appointments and pt verbalized understanding.

## 2015-12-27 NOTE — Progress Notes (Signed)
Physical Therapy Treatment & Discharge Patient Details Name: Carolyn Lynn MRN: [id number] DOB: 04/20/1985 Today's Date: Dec 30, 2015    History of Present Illness 30 yo female with readmission after childbirth a month ago, now has fever and UTI.  Pt had tachycardia, shocky, metabolic acidosis ascites.      PT Comments    Patient presents at independent level for gait and supervision for safety with stairs.  No further skilled PT needs at this time.  Encouraged hallway ambulation with nurse assist.  Will sign off.   Follow Up Recommendations  No PT follow up;Supervision - Intermittent     Equipment Recommendations  None recommended by PT    Recommendations for Other Services       Precautions / Restrictions Precautions Precautions: None    Mobility  Bed Mobility               General bed mobility comments: up when PT arrived  Transfers Overall transfer level: Modified independent Equipment used: None             General transfer comment: stands independent with UE support  Ambulation/Gait Ambulation/Gait assistance: Independent Ambulation Distance (Feet): 250 Feet Assistive device: None Gait Pattern/deviations: Step-through pattern;Decreased stride length;Narrow base of support     General Gait Details: decreased heel strike and bilateral inversion. no LOB, no UE support   Stairs Stairs: Yes Stairs assistance: Supervision Stair Management: One rail Right;One rail Left;Alternating pattern;Forwards Number of Stairs: 10 General stair comments: no safety issues or LOB  Wheelchair Mobility    Modified Rankin (Stroke Patients Only)       Balance     Sitting balance-Leahy Scale: Good       Standing balance-Leahy Scale: Good                      Cognition Arousal/Alertness: Awake/alert Behavior During Therapy: WFL for tasks assessed/performed Overall Cognitive Status: Within Functional Limits for tasks assessed                       Exercises Other Exercises Other Exercises: demonstrated clamshells for strength in hips due to inversion    General Comments General comments (skin integrity, edema, etc.): HR max 107 with stair negotiation in 70's with ambulation level flooring      Pertinent Vitals/Pain Pain Score: 0-No pain    Home Living                      Prior Function            PT Goals (current goals can now be found in the care plan section) Progress towards PT goals: Goals met/education completed, patient discharged from PT    Frequency    Min 3X/week      PT Plan Current plan remains appropriate    Co-evaluation             End of Session Equipment Utilized During Treatment: Gait belt Activity Tolerance: Patient tolerated treatment well Patient left: in chair;with call bell/phone within reach     Time: 0914-0926 PT Time Calculation (min) (ACUTE ONLY): 12 min  Charges:  $Gait Training: 8-22 mins                    G Codes:      Reginia Naas 12-30-2015, 9:40 AM Magda Kiel, [place] 12/30/15

## 2015-12-27 NOTE — Progress Notes (Signed)
Patient ID: Carolyn Lynn, female   DOB: 07/27/1985, 30 y.o.   MRN: [id number]  I have noted that Carolyn Lynn has been discharged.  I have not had a chance to see her today.  She has remained afebrile and her renal function has continued to improve.  She has wished to avoid ureteral stenting and she has no absolute indication for a ureteral stent at this time.  I did speak with Dr. Adrian BlackwaterStinson (OB/Gyn) yesterday about Carolyn Lynn.  There appears to be no clear reason for her impressive bilateral hematosalpinges and she may require further evaluation for a possible cause.  I did discuss the possibility of draining her hematosalpinges for diagnostic (to rule out infection) and therapeutic (to relieve ureteral obstruction) purposes.  He expressed concern about her developing recurrent bleeding at this time.  We also discussed the timeframe to expect resolution and he was unsure but felt that reimaging per GYN in 2 weeks would be reasonable.  If she continues to have hydronephrosis at that time, I would likely recommend drainage of her hematosalpinges (at least on the left side) to relieve her obstruction as the risk of bleeding at that time should be very low.  The risk of renal impairment over two weeks should be minimal without drainage and the patient is not very symptomatic.  Understanding more clearly that her initial instability may have been more hemorrhagic shock rather than septic shock, I am not sure if pyelonephritis is the only source of her fevers but I would have her complete her antibiotic course and then stop therapy.    I will arrange follow up with a renal ultrasound in our office in about 2 weeks but would emphasize the need for close GYN follow up so she can undergo further GYN intervention if her hematosalpinges are not resolving and continuing to have adverse clinical effects.

## 2015-12-27 NOTE — Care Management Note (Addendum)
Case Management Note  Patient Details  Name: Rosita KeaJervita J Brinkman MRN: [id number] Date of Birth: 05/21/1985  Subjective/Objective: Pt presented one month post partum with fever, c/o left sided back pain and abdominal pain. Treated for shock- initiated on IV Antibiotics as well. Urology and ID following. Plan will be for home once stable.                    Action/Plan: CM received referral for PCP and OB GYN f/u. Pt states she is without PCP- CM [facility] and no appointments available. CM did place on AVS for patient to call on 12-28-15 for Friday Appointment. [facility]. CM did call and they will call patient for an appointment. No further needs from CM at this time.    Expected Discharge Date:                  Expected Discharge Plan:  Home/Self Care  In-House Referral:  NA  Discharge planning Services  CM Consult  Post Acute Care Choice:  NA Choice offered to:  NA  DME Arranged:  N/A DME Agency:  NA  HH Arranged:  NA HH Agency:  NA  Status of Service:  Completed, signed off  If discussed at Long Length of Stay Meetings, dates discussed:  12-27-15  Additional Comments:  Gala LewandowskyGraves-Bigelow, Janaisa Birkland Kaye, RN 12/27/2015, 10:28 AM

## 2015-12-28 LAB — CULTURE, BLOOD (ROUTINE X 2): Culture: NO GROWTH

## 2015-12-29 ENCOUNTER — Ambulatory Visit (INDEPENDENT_AMBULATORY_CARE_PROVIDER_SITE_OTHER): Payer: Medicaid Other | Admitting: Obstetrics and Gynecology

## 2015-12-29 VITALS — BP 116/70 | HR 90 | Temp 99.0°F | Wt 126.2 lb

## 2015-12-29 DIAGNOSIS — N949 Unspecified condition associated with female genital organs and menstrual cycle: Secondary | ICD-10-CM

## 2015-12-29 DIAGNOSIS — N9489 Other specified conditions associated with female genital organs and menstrual cycle: Secondary | ICD-10-CM

## 2015-12-29 MED ORDER — OXYCODONE-ACETAMINOPHEN 5-325 MG PO TABS: 1.0000 | ORAL_TABLET | ORAL | 0 refills | Status: DC | PRN

## 2015-12-29 NOTE — Progress Notes (Signed)
Subjective:     Carolyn Lynn is a 30 y.o. female who presents for a postpartum visit. She is 6 weeks postpartum following a spontaneous vaginal delivery. I have fully reviewed the prenatal and intrapartum course. The delivery was at 39 gestational weeks. Outcome: spontaneous vaginal delivery. Anesthesia: epidural. Postpartum course has been complicated by bilateral adnexal mass, left hydronephrosis, and pp cardiomyopathy. Baby's course has been uncomplicated. Baby is feeding by formula. Bleeding no bleeding. Bowel function is normal. Bladder function is normal. Patient is not sexually active. Contraception method is none. Postpartum depression screening: negative. Patient is accompanied by her mother. She reports receiving ample help from her mother and sister. She reports some lower back pain and significant improvement in her lower abdominal pain. She denies CP, SOB     Review of Systems Pertinent items are noted in HPI.   Objective:    BP 116/70   Pulse 90   Temp 99 F (37.2 C) (Oral)   Wt 126 lb 3.2 oz (57.2 kg)   LMP 12/22/2015 (LMP Unknown)   Breastfeeding? No   BMI 21.66 kg/m   General:  alert, cooperative and no distress   Breasts:  inspection negative, no nipple discharge or bleeding, no masses or nodularity palpable  Lungs: clear to auscultation bilaterally  Heart:  regular rate and rhythm  Abdomen: soft, non-tender; bowel sounds normal; no masses,  no organomegaly   Vulva:  normal  Vagina: normal vagina, no discharge, exudate, lesion, or erythema  Cervix:  multiparous appearance  Corpus: normal size, contour, position, consistency, mobility, non-tender  Adnexa:  normal adnexa  Rectal Exam: Not performed.       Back: pain in lumbar region reproducible on exam. No CVA tenderness Assessment:     Normal postpartum exam. Pap smear not done at today's visit.   Plan:    1. Contraception: oral progesterone-only contraceptive 2. Patient advised to follow up with  cardiology for repeat echo.  -Follow up pelvic ultrasound ordered in mid december to assess bilateral adnexal mass - Contact information for Asher MuirJamie (behavioral health specialist provided - Advised the use of tylenol and heating pad for her lower back which appears to be musculoskeletal. Encouraged patient to perform stretching exercises 3. Follow up in: 4 weeks to review ultrasound results and further management if indicated or as needed.

## 2015-12-30 ENCOUNTER — Ambulatory Visit: Payer: Medicaid Other | Attending: Internal Medicine | Admitting: Physician Assistant

## 2015-12-30 ENCOUNTER — Ambulatory Visit (INDEPENDENT_AMBULATORY_CARE_PROVIDER_SITE_OTHER): Payer: Medicaid Other | Admitting: Nurse Practitioner

## 2015-12-30 ENCOUNTER — Encounter: Payer: Self-pay | Admitting: Nurse Practitioner

## 2015-12-30 ENCOUNTER — Encounter: Payer: Self-pay | Admitting: Physician Assistant

## 2015-12-30 VITALS — BP 110/68 | HR 86 | Ht 64.0 in | Wt 125.4 lb

## 2015-12-30 VITALS — BP 106/69 | HR 88 | Temp 98.4°F | Resp 16 | Wt 125.6 lb

## 2015-12-30 DIAGNOSIS — N390 Urinary tract infection, site not specified: Secondary | ICD-10-CM

## 2015-12-30 DIAGNOSIS — N179 Acute kidney failure, unspecified: Secondary | ICD-10-CM

## 2015-12-30 DIAGNOSIS — B961 Klebsiella pneumoniae [K. pneumoniae] as the cause of diseases classified elsewhere: Secondary | ICD-10-CM

## 2015-12-30 DIAGNOSIS — Z09 Encounter for follow-up examination after completed treatment for conditions other than malignant neoplasm: Secondary | ICD-10-CM | POA: Diagnosis not present

## 2015-12-30 DIAGNOSIS — D509 Iron deficiency anemia, unspecified: Secondary | ICD-10-CM | POA: Insufficient documentation

## 2015-12-30 DIAGNOSIS — I429 Cardiomyopathy, unspecified: Secondary | ICD-10-CM | POA: Diagnosis not present

## 2015-12-30 DIAGNOSIS — B9689 Other specified bacterial agents as the cause of diseases classified elsewhere: Secondary | ICD-10-CM

## 2015-12-30 DIAGNOSIS — N1330 Unspecified hydronephrosis: Secondary | ICD-10-CM | POA: Insufficient documentation

## 2015-12-30 DIAGNOSIS — I5022 Chronic systolic (congestive) heart failure: Secondary | ICD-10-CM | POA: Diagnosis not present

## 2015-12-30 LAB — POCT URINALYSIS DIPSTICK
Bilirubin, UA: NEGATIVE
Glucose, UA: NEGATIVE
KETONES UA: NEGATIVE
Nitrite, UA: NEGATIVE
PH UA: 6
PROTEIN UA: 100
SPEC GRAV UA: 1.015
Urobilinogen, UA: 0.2

## 2015-12-30 LAB — BASIC METABOLIC PANEL
BUN: 15 mg/dL (ref 7–25)
CO2: 22 mmol/L (ref 20–31)
Calcium: 9.1 mg/dL (ref 8.6–10.2)
Chloride: 102 mmol/L (ref 98–110)
Creat: 1.22 mg/dL — ABNORMAL HIGH (ref 0.50–1.10)
Glucose, Bld: 82 mg/dL (ref 65–99)
Potassium: 4.3 mmol/L (ref 3.5–5.3)
Sodium: 135 mmol/L (ref 135–146)

## 2015-12-30 LAB — CBC
HCT: 29.8 % — ABNORMAL LOW (ref 35.0–45.0)
Hemoglobin: 9.5 g/dL — ABNORMAL LOW (ref 11.7–15.5)
MCH: 24.8 pg — ABNORMAL LOW (ref 27.0–33.0)
MCHC: 31.9 g/dL — ABNORMAL LOW (ref 32.0–36.0)
MCV: 77.8 fL — ABNORMAL LOW (ref 80.0–100.0)
MPV: 8.4 fL (ref 7.5–12.5)
Platelets: 376 K/uL (ref 140–400)
RBC: 3.83 MIL/uL (ref 3.80–5.10)
RDW: 18.8 % — ABNORMAL HIGH (ref 11.0–15.0)
WBC: 8.3 K/uL (ref 3.8–10.8)

## 2015-12-30 NOTE — Patient Instructions (Addendum)
We will be checking the following labs today - BMET and CBC   Medication Instructions:    Continue with your current medicines.     Testing/Procedures To Be Arranged:  N/A  Follow-Up:   See me or Dr. Duke Salviaandolph in 2 weeks. Dr[facility]    Other Special Instructions:   Limit salt  Ok to hold your baby  Rest when the baby is resting  Monitor your temperature    If you need a refill on your cardiac medications before your next appointment, please call your pharmacy.   Call the [facility] office at [phone] if you have any questions, problems or concerns.

## 2015-12-30 NOTE — Progress Notes (Signed)
CARDIOLOGY OFFICE NOTE  Date:  12/30/2015    Carolyn Lynn Date of Birth: 12/09/1985 Medical Record #[id number]  PCP:  No PCP Per Patient  Cardiologist:  Duke Salviaandolph  Chief Complaint  Patient presents with  . Cardiomyopathy    Post hospital visit - seen for Dr. Duke Salviaandolph    History of Present Illness: Carolyn Lynn is a 30 y.o. female who presents today for a post hospital visit. Seen for Dr. Duke Salviaandolph.   She has no significant past medical history.   She presented earlier this month to the hospital with heart failure symptoms and was noted to have profound anemia and newly diagnosed systolic heart failure.  Carolyn Lynn had an uncomplicated spontaneous vaginal delivery 11/13/15.  Her shortness of breath and edema started one week after delivery.  She was treated for a Klebsiella UTI 10/22 and repeat culture 10/29 was negative.  She presented to the ED 10/31 with shortness of breath, lower extremity edema and abdominal distension. She was found to be tachycardic and hypotensive. Abdominal ultrasound and CT scan showed bilateral hydrosalpinx, left hydronephrosis, hepatosplenomegaly and a small amount of ascites. She was also noted to have periaortic adenopathy. 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 Urine culture grew the same Klebsiella again. She was started on empiric vancomycin and piperacillin tazobactam.  Consequently change to ceftriaxone once culture results came back then changed to cefuroxime  Her hemoglobin was 4.9 and there was concern for both hemorrhage and DIC secondary to sepsis.  H/H improved after transfusion.  She also had a BNP was elevated to 350.  Echo revealed LVEF 40-45% with diffuse hypokinesis.  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  She was placed on appropriate therapy - no ACE due to renal dysfunction. She is to have a follow up echo in 2 to 3 months. Will need  decision about future pregnancy. She was to see GYN and GU after discharge. She does not have a PCP.   Comes in today. Here with her mom.  Tired and weak. Little short of breath but says she is better than when she was in the hospital. She has only been home 3 days. Not dizzy. Little tearful. To establish with Carolyn Lynn later today. Saw GYN yesterday. Mom notes she was surprised about the "sepsis" diagnosis and that they were not aware of this. BP still soft.   Past Medical History:  Diagnosis Date  . Medical history non-contributory     Past Surgical History:  Procedure Laterality Date  . NO PAST SURGERIES       Medications: Current Outpatient Prescriptions  Medication Sig Dispense Refill  . carvedilol (COREG) 3.125 MG tablet Take 1 tablet (3.125 mg total) by mouth 2 (two) times daily with a meal. 60 tablet 1  . famotidine (PEPCID) 20 MG tablet Take 1 tablet (20 mg total) by mouth daily. 30 tablet 1  . feeding supplement, ENSURE ENLIVE, (ENSURE ENLIVE) LIQD Take 237 mLs by mouth daily. 237 mL 12  . ferrous sulfate 325 (65 FE) MG tablet Take 325 mg by mouth 3 (three) times daily.  5  . oxyCODONE-acetaminophen (PERCOCET/ROXICET) 5-325 MG tablet Take 1 tablet by mouth every 4 (four) hours as needed. 20 tablet 0   No current facility-administered medications for this visit.     Allergies: No Known Allergies  Social History: The patient  reports that she has never smoked.  She has never used smokeless tobacco. She reports that she does not drink alcohol or use drugs.   Family History: The patient's family history is not on file. Mother is alive.   Review of Systems: Please see the history of present illness.   Otherwise, the review of systems is positive for none.   All other systems are reviewed and negative.   Physical Exam: VS:  BP 110/68   Pulse 86   Ht 5\' 4"  (1.626 m)   Wt 125 lb 6.4 oz (56.9 kg)   LMP 12/22/2015 (LMP Unknown)   SpO2 98% Comment: at rest  BMI  21.52 kg/m  .  BMI Body mass index is 21.52 kg/m.  Wt Readings from Last 3 Encounters:  12/30/15 125 lb 6.4 oz (56.9 kg)  12/29/15 126 lb 3.2 oz (57.2 kg)  12/27/15 128 lb (58.1 kg)    General: Pleasant but quiet. Affect a little flat. She is alert and in no acute distress.  Seems tired.  HEENT: Normal.  Neck: Supple, no JVD, carotid bruits, or masses noted.  Cardiac: Regular rate and rhythm. No definitive S3. No edema.  Respiratory:  Lungs are clear to auscultation bilaterally with normal work of breathing.  GI: Soft and nontender. Belly is still pretty distended.  MS: No deformity or atrophy. Gait and ROM intact.  Skin: Warm and dry. Color is normal.  Neuro:  Strength and sensation are intact and no gross focal deficits noted.  Psych: Alert, appropriate and with normal affect.   LABORATORY DATA:  EKG:  EKG is not ordered today.  Lab Results  Component Value Date   WBC 7.4 12/26/2015   HGB 8.4 (L) 12/26/2015   HCT 27.7 (L) 12/26/2015   PLT 327 12/26/2015   GLUCOSE 93 12/27/2015   ALT 13 (L) 12/26/2015   AST 17 12/26/2015   NA 132 (L) 12/27/2015   K 4.1 12/27/2015   CL 102 12/27/2015   CREATININE 1.23 (H) 12/27/2015   BUN 13 12/27/2015   CO2 23 12/27/2015   TSH 3.534 12/14/2015   INR 1.45 12/16/2015    BNP (last 3 results)  Recent Labs  12/13/15 1723 12/15/15 0900  BNP 350.5* 226.9*    ProBNP (last 3 results) No results for input(s): PROBNP in the last 8760 hours.   Other Studies Reviewed Today:  2-D echo Study Conclusions  - Left ventricle: The cavity size was mildly dilated. Systolic function was mildly to moderately reduced. The estimated ejection fraction was in the range of 40% to 45%. Mild diffuse hypokinesis with no identifiable regional variations. Left ventricular diastolic function parameters were mostly normal. Only the medial annulus diastolic velocity was mildly reduced. - Pericardium, extracardiac: A trivial pericardial  effusion was identified.  CT ABDOMEN/PELVIS IMPRESSION 12/2015: 1. Moderate chronic left-sided hydronephrosis again noted, slightly more prominent than on prior CT from 2 weeks ago. This likely reflects mass effect on the distal left ureter due to the patient's very large left-sided hematosalpinx. No obstructing stone seen. 2. Large bilateral hematosalpinx noted, larger on the left, similar to the prior studies. 3. Foci of mildly decreased attenuation along the anterior aspect of the right kidney may reflect mild focal pyelonephritis, without a definite drainable abscess. 4. Scattered small left renal cysts seen. 5. Mild bibasilar airspace opacities likely reflect atelectasis or scarring. 6. Nonspecific scattered hyperdensities again noted within the liver, measuring up to 1.6 cm in size.   Electronically Signed   By: Roanna RaiderJeffery  Chang M.D.   On: 12/25/2015  23:31   Assessment/Plan: 1. Acute systolic CHF with EF of 40 to 40% - only on very low dose Coreg. BP soft. I suspect her symptoms are multifactorial. Recheck her lab today. Explained that she will have another echo in 2 to 3 months to recheck EF - hopefully her EF will improve. She will need to avoid pregnancy at this time. No ACE given renal dysfunction and low BP. Restrict salt. Advised adequate rest. Asking if she can hold her baby which she most certainly can.   2. Fever/UTI - seems resolved.   3. Hypotension/septic shock - improving  4. Renal failure - recheck lab   5. L hydronephrosis - needing to see GU - not clear to me if she has been referred - establishing with [employer] later today.   6. Bilateral hematosalpinx - per GYN  7. Acute anemia - given 5 units of PRBC - rechecking lab today.   Current medicines are reviewed with the patient today.  The patient does not have concerns regarding medicines other than what has been noted above.  The following changes have been made:  See above.  Labs/ tests  ordered today include:   No orders of the defined types were placed in this encounter.    Disposition:   FU with Dr. Duke Salvia in about 2 weeks.   Patient is agreeable to this plan and will call if any problems develop in the interim.   Signed: Rosalio Macadamia, RN, ANP-C 12/30/2015 11:23 AM  [facility] [address] [place], [place]  [address] Phone: [phone] Fax: [phone]

## 2015-12-30 NOTE — Progress Notes (Signed)
Patient ID: Carolyn Lynn, female   DOB: 10/31/1985, 30 y.o.   MRN: [id number]   Carolyn Lynn, is a 30 y.o. female  [id number]  [id number]  DOB - 05/23/1985  Subjective:  Chief Complaint and HPI: Carolyn Lynn is a 30 y.o. female here today to establish care and for a follow up visit after being hospitalized 12/13/2015-12/27/2015 for sepsis and post partum cardiomyopathy.  She does not have any new complaints.  She denies urinary symptoms.  She feels exhausted but much better than she did in the hospital.  She does feel that she is improving. She was seen by cardiology earlier today.  BMP and CBC drawn there.  They are seeing her back in 2 weeks.  She saw gynecology yesterday.  They have scheduled a repeat pelvic U/S for Dec 19 to check the status of the B hematosalpinx/adnexal mass.  She sees the gynecologist again in 1 month.  Her mom is with her.  She is not breastfeeding.  Urology f/up is scheduled with Carolyn Lynn at 2:45 on Dec 7th, 2017.  ED/Hospital notes/labs and today's cardiology visit reviewed.  Spent more than 50 mins reviewing records.  Hospital HPI: Carolyn RedwoodJervita J Lawsonis a 30 y.o.femaleG1P1who had an uneventful normal spontaneous vaginal delivery on 11/13/2015. Over the next few weeks she developed fatigue, abdominal cramps, left lower back pain and shortness of breath. She was seen in the emergency room department on 12/04/2015. She had no urinary tract symptoms but a UA showed pyuria. She was discharged home on oral cephalexin. Her urine culture grew Klebsiella sensitive to cephalexin. She completed treatment on 12/09/2015 but was feeling worse. She came back to the emergency department on 12/13/2015 complaining of worsening fatigue, shortness of breath, abdominal and back pain, nausea and vomiting. She was found to be tachycardic and hypotensive. Abdominal ultrasound and CT scan showed bilateral hydrosalpinx, left hydronephrosis, hepatosplenomegaly and a small amount of  ascites. She was also noted to have periaortic adenopathy. She was found to have  postpartum cardiomyopathy, a hemoglobin of 4.9 and acute kidney injury. Urine culture grew the same Klebsiella again. She was started on empiric vancomycin and piperacillin tazobactam.  Consequently change to  ceftriaxone once culture results came back then changed to cefuroxime .  Her hemoglobin was 4.9 and there was concern for both hemorrhage and DIC secondary to sepsis. H/H improved after transfusion. She also had a BNP was elevated to 350. Echo revealed LVEF 40-45% with diffuse hypokinesis. 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 She was placed on appropriate therapy - no ACE due to renal dysfunction. She is to have a follow up echo in 2 to 3 months.   HOSPITAL COURSE:   Acute systolic CHF -EF 46-96%40-45% -?peripartum CM - Cards has evaluated - to have f/u TTE in 2-3 months - no ACEi/ARB due to AKI - BB initiated per Cards today - not volume overloaded on exam . Continue Coreg She needs post hospital follow-up with cardiology to follow her left ventricular function. Decisions about whether she can become pregnant again will have to be made very carefully. LV dysfunction was caused as a postpartum problem.   Persistent fever-improving fever curve,   Likely source is urine, CT scan on 10/31 showed bilateral hydrosalpinges Mild left hydronephrosis could be secondary to enlarged hydrosalpinges or UPJ stone Patient has had a positive urine culture. Klebsiella pneumonia and has fever despite being on cefuroxime Repeated blood cultures 11/10 one out of 2  bottles positive for staph epi, vancomycin added to her antibiotic regimen 11/11-11/12,discontinued by infectious disease Patient unable to do MRI of the lumbar spine to rule out discitis, reattempted but patient declined to do an MRI of the back renal ultrasound showed moderate left hydronephrosis Urology consulted,  given increase in the temperature, CT scan ordered for possible stent placement , as per urology , patient has stable left hydronephrosis, no renal abscess, may need a left ureteral stent placement, but patient hesitant to proceed. Patient would need urology evaluation  if her fever spikes. She will need follow-up with gynecology for obstruction caused by the left hydrosalpinx and bilateral hydrosalpinx. Antibiotics will be discontinued before discharge-discussed with Dr Luciana Lynn  prior to discharge   Hypotension / septic Shock  -due to hypovolemia, UTI, cardiogenic shock, and hemorrhage - BP has stabilized - Cardiology added Coreg   Acute Renal failure - Mild L Hydronephrosis -secondary to UTI and hypotensive shock - mild hydro felt to be due to hematosalpinx - creatinine improved from 2.44 >1.23 - avoid nephrotoxins     Klebsiella pneumoniae UTI/HCAP? Chest x-ray 11/9 also concerning for pneumonia, however patient has no pulmonary symptoms Antibiotics for UTI discontinued prior to discharge  Large bilateral hematosalpinx Noted on transvaginal US - GYN has evaluated . Patient needs to follow-up with Carolyn Lynn  Hepatosplenomegaly Felt to likely be due to passive congestion in the setting of acute heart failure   Acute anemia / Fe Deficiency anemia S/p 5U PRBC total - Hgb stabilizing / improving - cont to follow trend  Hemoglobin 8.4 on 11/13     ROS:   Constitutional:  No f/c, No night sweats, No unexplained weight loss. EENT:  No vision changes, No blurry vision, No hearing changes. No mouth, throat, or ear problems.  Respiratory: No cough, No SOB Cardiac: No CP, no palpitations GI:  No abd pain, No N/V/D. GU: No Urinary s/sx Musculoskeletal: No joint pain Neuro: No headache, no dizziness, no motor weakness.  Skin: No rash Endocrine:  No polydipsia. No polyuria.  Psych: Denies SI/HI  No problems updated.  ALLERGIES: No Known Allergies  PAST MEDICAL HISTORY: Past  Medical History:  Diagnosis Date  . Medical history non-contributory     MEDICATIONS AT HOME: Prior to Admission medications   Medication Sig Start Date End Date Taking? Authorizing Provider  carvedilol (COREG) 3.125 MG tablet Take 1 tablet (3.125 mg total) by mouth 2 (two) times daily with a meal. 12/23/15   Richarda OverlieNayana Abrol, MD  famotidine (PEPCID) 20 MG tablet Take 1 tablet (20 mg total) by mouth daily. 12/24/15   Richarda OverlieNayana Abrol, MD  feeding supplement, ENSURE ENLIVE, (ENSURE ENLIVE) LIQD Take 237 mLs by mouth daily. 12/27/15   Richarda OverlieNayana Abrol, MD  ferrous sulfate 325 (65 FE) MG tablet Take 325 mg by mouth 3 (three) times daily. 11/21/15   Historical Provider, MD  oxyCODONE-acetaminophen (PERCOCET/ROXICET) 5-325 MG tablet Take 1 tablet by mouth every 4 (four) hours as needed. 12/29/15   Peggy Constant, MD     Objective:  EXAM:   Vitals:   12/30/15 1418  BP: 106/69  Pulse: 88  Resp: 16  Temp: 98.4 F (36.9 C)  TempSrc: Oral  SpO2: 96%  Weight: 125 lb 9.6 oz (57 kg)    General appearance : A&OX3. NAD. Non-toxic-appearing.  Appears tired. HEENT: Atraumatic and Normocephalic.  PERRLA. EOM intact.  TM clear B. Mouth-MMM, post pharynx WNL w/o erythema, No PND. Neck: supple, no JVD. No cervical lymphadenopathy. No thyromegaly Chest/Lungs:  Breathing-non-labored, Good air entry bilaterally, breath sounds normal without rales, rhonchi, or wheezing  CVS: S1 S2 regular, no murmurs, gallops, rubs  Abdomen: Bowel sounds present, Non tender and not distended with no gaurding, rigidity or rebound. Extremities: Bilateral Lower Ext shows no edema, both legs are warm to touch with = pulse throughout Neurology:  CN II-XII grossly intact, Non focal.   Psych:  TP linear. J/I WNL. Normal speech. Appropriate eye contact and blunted affect.  Skin:  No Rash  Data Review No results found for: HGBA1C   Assessment & Plan   1. UTI due to Klebsiella species - POCT urinalysis dipstick   2. AKI (acute  kidney injury) (HCC) with hydronephrosis-keep f/up appt with Dr Laverle Patter, Dec 7,2017.  BMP drawn earlier today  3. Cardiomyopathy, unspecified type (HCC) Keep f/up appts with cardiology in 2 weeks(seen earlier today)  4. Anemia-improving after transfusion and on iron TID.  CBC drawn by cardiology today.   Patient have been counseled extensively about nutrition and exercise  Return in about 3 weeks (around 01/20/2016) for establish with PCP; f/up multiple issues.  The patient was given clear instructions to go to ER or return to medical center if symptoms don't improve, worsen or new problems develop. The patient verbalized understanding. The patient was told to call to get lab results if they haven't heard anything in the next week.     Georgian Co, PA-C [facility] [place], [place] [phone]   12/30/2015, 2:33 PM     rawn there.

## 2016-01-10 NOTE — Progress Notes (Signed)
Cardiology Office Note   Date:  01/11/2016   ID:  Carolyn Carolyn Lynn, DOB 10/28/1985, MRN [id number]  PCP:  No PCP Per Patient  Cardiologist:   Chilton Siiffany [place], MD   Chief Complaint  Patient presents with  . Follow-up    Acute combined systolic and diastolic heart failure      History of Present Illness: Carolyn Lynn is a 30 y.o. female with peripartum cardiomyopathy who presents for follow up.  Ms. Carolyn Lynn delivered a healthy baby 11/13/15.  She presented to the ED 10/31 with shortness of breath and lower extremity edema.  Echo revealed LVEF 40-45%.  She was noted to have mild left hydronephrosis and enlarged hydrosalpinges and a possible UPJ stone.  Urine cultures were positive for Klebsiella pneumonia.  Blood cultures were 1:2 positive for Staph epi.  During that hospitalization there was also concern for pneumonia.  The hospitalization was also complicated by large bilateral hematosalpinx.  She was noted to have profound anemia with a hemoglobin of 4.9.  She required 5 units of pRBCs.  Her anemia was felt to be both due to bleeding and DIC from sepsis.  Since leaving the hospital Ms. Carolyn Lynn has been feeling well.  Her breathing is much better.  She denies lower extremity edema or PND.  She is sleeping on 3 pillows, which is new since hospitalization.  She also notes that her mood has been sad and depressed.  She isn't thinking about harming herself or anyone else.  Overall she thinks it is improving, but she is still very sad.    Past Medical History:  Diagnosis Date  . Medical history non-contributory     Past Surgical History:  Procedure Laterality Date  . NO PAST SURGERIES       Current Outpatient Prescriptions  Medication Sig Dispense Refill  . carvedilol (COREG) 3.125 MG tablet Take 1 tablet (3.125 mg total) by mouth 2 (two) times daily with a meal. 60 tablet 1  . famotidine (PEPCID) 20 MG tablet Take 1 tablet (20 mg total) by mouth daily. 30 tablet 1  . feeding  supplement, ENSURE ENLIVE, (ENSURE ENLIVE) LIQD Take 237 mLs by mouth daily. 237 mL 12  . ferrous sulfate 325 (65 FE) MG tablet Take 325 mg by mouth 3 (three) times daily.  5  . oxyCODONE-acetaminophen (PERCOCET/ROXICET) 5-325 MG tablet Take 1 tablet by mouth every 4 (four) hours as needed. 20 tablet 0   No current facility-administered medications for this visit.     Allergies:   Patient has no known allergies.    Social History:  The patient  reports that she has never smoked. She has never used smokeless tobacco. She reports that she does not drink alcohol or use drugs.   Family History:  The patient's Family history is unknown by patient.    ROS:  Please see the history of present illness.   Otherwise, review of systems are positive for none.   All other systems are reviewed and negative.    PHYSICAL EXAM: VS:  BP 102/68   Pulse 68   Ht 5\' 4"  (1.626 m)   Wt 53.6 kg (118 lb 3.2 oz)   LMP 12/22/2015 (LMP Unknown)   BMI 20.29 kg/m  , BMI Body mass index is 20.29 kg/m. GENERAL:  Well appearing HEENT:  Pupils equal round and reactive, fundi not visualized, oral mucosa unremarkable NECK:  No jugular venous distention, waveform within normal limits, carotid upstroke brisk and symmetric, no bruits, no thyromegaly  LYMPHATICS:  No cervical adenopathy LUNGS:  Clear to auscultation bilaterally HEART:  RRR.  PMI not displaced or sustained,S1 and S2 within normal limits, no S3, no S4, no clicks, no rubs,  no murmurs ABD:  Flat, positive bowel sounds normal in frequency in pitch, no bruits, no rebound, no guarding, no midline pulsatile mass, no hepatomegaly, no splenomegaly EXT:  2 plus pulses throughout, no edema, no cyanosis no clubbing SKIN:  No rashes no nodules NEURO:  Cranial nerves II through XII grossly intact, motor grossly intact throughout PSYCH:  Cognitively intact, oriented to person place and time   EKG:  EKG is not ordered today.   Echo 12/15/15: Study  Conclusions  - Left ventricle: The cavity size was mildly dilated. Systolic   function was mildly to moderately reduced. The estimated ejection   fraction was in the range of 40% to 45%. Mild diffuse hypokinesis   with no identifiable regional variations. Left ventricular   diastolic function parameters were mostly normal. Only the medial   annulus diastolic velocity was mildly reduced. - Pericardium, extracardiac: A trivial pericardial effusion was   identified.   Recent Labs: 12/14/2015: TSH 3.534 12/15/2015: B Natriuretic Peptide 226.9 12/18/2015: Magnesium 1.9 12/26/2015: ALT 13 12/30/2015: BUN 15; Creat 1.22; Hemoglobin 9.5; Platelets 376; Potassium 4.3; Sodium 135    Lipid Panel No results found for: CHOL, TRIG, HDL, CHOLHDL, VLDL, LDLCALC, LDLDIRECT    Wt Readings from Last 3 Encounters:  01/11/16 53.6 kg (118 lb 3.2 oz)  12/30/15 57 kg (125 lb 9.6 oz)  12/30/15 56.9 kg (125 lb 6.4 oz)      ASSESSMENT AND PLAN:  # Chronic systolic and diastolic heart failure: Ms. Carolyn Lynn is doing well.  LVEF 40-45%.  She appears to be euvolemic at this time.  It remains unclear whether Ms. Schleyer's heart failure was attributable to peripartum cardiomyopathy or sepsis.  This makes it difficult to determine her risk for future pregnancies.  We discussed the implications of each diagnosis.  She is not interested in additional pregnancies at this time.  We will table her discussion of risk until after her follow up echo in February.  She understands that we probably will not ever be able to distinguish these two causes.  # Situational depression: Ms. Carolyn Lynn seems to be struggling with depression from both her recent pregnancy and from acute illness.  She is not currently a danger to herself or anyone else.  I suggested that she bring this up with her PCP appointment next week.   Current medicines are reviewed at length with the patient today.  The patient does not have concerns regarding  medicines.  The following changes have been made:  no change  Labs/ tests ordered today include:   Orders Placed This Encounter  Procedures  . ECHOCARDIOGRAM COMPLETE     Disposition:   FU with Brandon Scarbrough C. Duke Salviaandolph, MD, [facility] in 2 months     This note was written with the assistance of speech recognition software.  Please excuse any transcriptional errors.  Signed, Charli Halle C. Duke Salviaandolph, MD, [facility]  01/11/2016 10:20 AM    [facility]

## 2016-01-11 ENCOUNTER — Ambulatory Visit (INDEPENDENT_AMBULATORY_CARE_PROVIDER_SITE_OTHER): Payer: Medicaid Other | Admitting: Cardiovascular Disease

## 2016-01-11 ENCOUNTER — Encounter: Payer: Self-pay | Admitting: Cardiovascular Disease

## 2016-01-11 VITALS — BP 102/68 | HR 68 | Ht 64.0 in | Wt 118.2 lb

## 2016-01-11 DIAGNOSIS — I5041 Acute combined systolic (congestive) and diastolic (congestive) heart failure: Secondary | ICD-10-CM | POA: Diagnosis not present

## 2016-01-11 DIAGNOSIS — I429 Cardiomyopathy, unspecified: Secondary | ICD-10-CM

## 2016-01-11 DIAGNOSIS — F4321 Adjustment disorder with depressed mood: Secondary | ICD-10-CM

## 2016-01-11 NOTE — Patient Instructions (Signed)
Continue same medications   Schedule Echo after 03/16/16   Your physician recommends that you schedule a follow-up appointment with Dr.[place] after Echo.

## 2016-01-13 ENCOUNTER — Telehealth: Payer: Self-pay

## 2016-01-13 NOTE — Telephone Encounter (Signed)
Return to work letter faxed to Carolyn RutherfordMary Ann Lynn at fax # [phone].

## 2016-01-17 ENCOUNTER — Encounter: Payer: Self-pay | Admitting: Internal Medicine

## 2016-01-17 ENCOUNTER — Ambulatory Visit: Payer: Medicaid Other | Attending: Internal Medicine | Admitting: Internal Medicine

## 2016-01-17 VITALS — BP 106/76 | HR 87 | Temp 97.8°F | Resp 16 | Ht 64.0 in | Wt 116.0 lb

## 2016-01-17 DIAGNOSIS — O903 Peripartum cardiomyopathy: Secondary | ICD-10-CM | POA: Diagnosis not present

## 2016-01-17 DIAGNOSIS — F4321 Adjustment disorder with depressed mood: Secondary | ICD-10-CM | POA: Insufficient documentation

## 2016-01-17 DIAGNOSIS — D508 Other iron deficiency anemias: Secondary | ICD-10-CM

## 2016-01-17 DIAGNOSIS — I429 Cardiomyopathy, unspecified: Secondary | ICD-10-CM | POA: Diagnosis present

## 2016-01-17 MED ORDER — CARVEDILOL 3.125 MG PO TABS: 3.1250 mg | ORAL_TABLET | Freq: Two times a day (BID) | ORAL | 1 refills | Status: DC

## 2016-01-17 NOTE — Progress Notes (Signed)
Carolyn HolterJervita Rascon, is a 30 y.o. female  UJW:[id number]:[id number]  FAO:[id number]  DOB - 11/16/1985  CC:  Chief Complaint  Patient presents with  . Establish Care       HPI: Carolyn HolterJervita Kunesh is a 30 y.o. female here today to establish medical care, last saw PA 12/30/15, w/ significant pmhx of peripartum cm.  Per pt, she is doing much better now. Has situational stress/depression after hospitalization, but says she is doing much better now.  Not crying, no si/hi/avh, no concerns of postpartum depression.  She is single, but the baby's daddy is involved. She also has a lot of support with her mom and sister as well.  Significant hx copied from Dr Horris Latinoandolf note 01/11/16: [name] delivered a healthy baby boy 11/13/15.  She presented to the ED 10/31 with shortness of breath and lower extremity edema.  Echo revealed LVEF 40-45%.  She was noted to have mild left hydronephrosis and enlarged hydrosalpinges and a possible UPJ stone.  Urine cultures were positive for Klebsiella pneumonia.  Blood cultures were 1:2 positive for Staph epi.  During that hospitalization there was also concern for pneumonia.  The hospitalization was also complicated by large bilateral hematosalpinx.  She was noted to have profound anemia with a hemoglobin of 4.9.  She required 5 units of pRBCs.  Her anemia was felt to be both due to bleeding and DIC from sepsis.  Patient has No headache, No chest pain, No abdominal pain - No Nausea, No new weakness tingling or numbness, No Cough - SOB.  She is currently not breast feeding, denies cp. Does not smoke or drink etoh.  Taking all meds as prescribed.  Pt is not sure when last pap, [facility]   Review of Systems:  per hpi, o/w all systems reviewed and negative.  No Known Allergies Past Medical History:  Diagnosis Date  . Medical history non-contributory    Current Outpatient Prescriptions on File Prior to Visit  Medication Sig Dispense Refill  . ferrous sulfate  325 (65 FE) MG tablet Take 325 mg by mouth 3 (three) times daily.  5  . feeding supplement, ENSURE ENLIVE, (ENSURE ENLIVE) LIQD Take 237 mLs by mouth daily. 237 mL 12  . oxyCODONE-acetaminophen (PERCOCET/ROXICET) 5-325 MG tablet Take 1 tablet by mouth every 4 (four) hours as needed. (Patient not taking: Reported on 01/17/2016) 20 tablet 0   No current facility-administered medications on file prior to visit.    Family History  Problem Relation Age of Onset  . Family history unknown: Yes   Social History   Social History  . Marital status: Single    Spouse name: N/A  . Number of children: N/A  . Years of education: N/A   Occupational History  . Not on file.   Social History Main Topics  . Smoking status: Never Smoker  . Smokeless tobacco: Never Used  . Alcohol use No  . Drug use: No  . Sexual activity: Yes    Birth control/ protection: None   Other Topics Concern  . Not on file   Social History Narrative  . No narrative on file    Objective:   Vitals:   01/17/16 1352  BP: 106/76  Pulse: 87  Resp: 16  Temp: 97.8 F (36.6 C)    Filed Weights   01/17/16 1352  Weight: 116 lb (52.6 kg)    BP Readings from Last 3 Encounters:  01/17/16 106/76  01/11/16 102/68  12/30/15 106/69  Physical Exam: Constitutional: Patient appears well-developed and well-nourished. No distress. AAOx3, thin, pleasant.  HENT: Normocephalic, atraumatic, External right and left ear normal. Oropharynx is clear and moist. bilat TMs clear. Eyes: Conjunctivae and EOM are normal. PERRL, no scleral icterus.  Neck: Normal ROM. Neck supple. No JVD.  CVS: RRR, S1/S2 +, no murmurs, no gallops, no carotid bruit.  Pulmonary: Effort and breath sounds normal, no stridor, rhonchi, wheezes, rales.  Abdominal: Soft. BS +, no distension, tenderness, rebound or guarding.  Musculoskeletal: Normal range of motion. No edema and no tenderness.  LE: bilat/ no c/c/e, pulses 2+ bilateral. Neuro: Alert. muscle  tone coordination wnl. No cranial nerve deficit grossly. Skin: Skin is warm and dry. No rash noted. Not diaphoretic. No erythema. No pallor. Psychiatric: Normal mood and affect. Behavior, judgment, thought content normal.  Lab Results  Component Value Date   WBC 8.3 12/30/2015   HGB 9.5 (L) 12/30/2015   HCT 29.8 (L) 12/30/2015   MCV 77.8 (L) 12/30/2015   PLT 376 12/30/2015   Lab Results  Component Value Date   CREATININE 1.22 (H) 12/30/2015   BUN 15 12/30/2015   NA 135 12/30/2015   K 4.3 12/30/2015   CL 102 12/30/2015   CO2 22 12/30/2015    No results found for: HGBA1C Lipid Panel  No results found for: CHOL, TRIG, HDL, CHOLHDL, VLDL, LDLCALC      Depression screen [facility] 2/[facility] 01/17/2016 12/30/2015 12/29/2015 05/03/2015  Decreased Interest 0 0 0 0  Down, Depressed, Hopeless 0 0 0 0  PHQ - 2 Score 0 0 0 0  Altered sleeping 0 0 0 -  Tired, decreased energy 0 0 0 -  Change in appetite 0 0 0 -  Feeling bad or failure about yourself  0 0 0 -  Trouble concentrating 0 0 0 -  Moving slowly or fidgety/restless 0 0 0 -  Suicidal thoughts 0 0 0 -  PHQ-9 Score 0 0 0 -    Assessment and plan:   1. Cardiomyopathy, unspecified type (HCC) Appears euvolemic, defer to cards - continue bb - echo pending in Feb  2. Peripartum cardiomyopathy - euvolemic.  3. Other iron deficiency anemia Continue iron pills, fiber /stool softeners as need for constipation chk cbc next exam  4. Hx of postpartum/situational depression - per pt, denies problems now, feels much better overall. - has family support w/ mom and sister, baby's dad also is involved. - not interested in any rx at this time.   5. Pap smear? Thinks last done around Jan 2016, she will try to get reports  6. Declined tdap and flu vac today. Pt did not get the tdap during her recent pregnancy.  Return in about 3 months (around 04/16/2016), or if symptoms worsen or fail to improve.  The patient was given clear instructions to  go to ER or return to medical center if symptoms don't improve, worsen or new problems develop. The patient verbalized understanding. The patient was told to call to get lab results if they haven't heard anything in the next week.    This note has been created with Education officer, environmentalDragon speech recognition software and smart phrase technology. Any transcriptional errors are unintentional.   Pete Glatterawn T Lauriana Denes, MD, MBA/MHA [facility] [place], [place] [phone]   01/17/2016, 2:22 PM

## 2016-01-17 NOTE — Patient Instructions (Signed)

## 2016-01-17 NOTE — Progress Notes (Signed)
Follow up.

## 2016-01-30 ENCOUNTER — Ambulatory Visit: Payer: Medicaid Other | Admitting: Obstetrics & Gynecology

## 2016-01-31 ENCOUNTER — Ambulatory Visit (HOSPITAL_COMMUNITY)
Admission: RE | Admit: 2016-01-31 | Discharge: 2016-01-31 | Disposition: A | Discharge status: Home / Self Care | Payer: Medicaid Other | Source: Ambulatory Visit | Attending: Obstetrics and Gynecology | Admitting: Obstetrics and Gynecology

## 2016-01-31 ENCOUNTER — Other Ambulatory Visit: Payer: Self-pay | Admitting: Obstetrics and Gynecology

## 2016-01-31 DIAGNOSIS — N9489 Other specified conditions associated with female genital organs and menstrual cycle: Secondary | ICD-10-CM

## 2016-01-31 DIAGNOSIS — N836 Hematosalpinx: Secondary | ICD-10-CM | POA: Insufficient documentation

## 2016-02-02 ENCOUNTER — Encounter: Payer: Self-pay | Admitting: Obstetrics & Gynecology

## 2016-02-02 ENCOUNTER — Ambulatory Visit (INDEPENDENT_AMBULATORY_CARE_PROVIDER_SITE_OTHER): Payer: Medicaid Other | Admitting: Obstetrics & Gynecology

## 2016-02-02 VITALS — BP 102/70 | HR 79 | Wt 114.8 lb

## 2016-02-02 DIAGNOSIS — N7011 Chronic salpingitis: Secondary | ICD-10-CM

## 2016-02-02 NOTE — Progress Notes (Signed)
US scheduled for 03/14/15 @ 1300.  Pt notified.

## 2016-02-02 NOTE — Progress Notes (Signed)
Patient ID: Carolyn Lynn, female   DOB: 05/12/1985, 30 y.o.   MRN: [id number]  Chief Complaint  Patient presents with  . Follow-up  left hemaosalpinx  HPI Carolyn Lynn is a 30 y.o. female.  G1P1001 Patient's last menstrual period was 01/26/2016 (exact date). US f/u for pelvic mass was done 2 days ago. HPI  Past Medical History:  Diagnosis Date  . Medical history non-contributory     Past Surgical History:  Procedure Laterality Date  . NO PAST SURGERIES      Family History  Problem Relation Age of Onset  . Family history unknown: Yes    Social History Social History  Substance Use Topics  . Smoking status: Never Smoker  . Smokeless tobacco: Never Used  . Alcohol use No    No Known Allergies  Current Outpatient Prescriptions  Medication Sig Dispense Refill  . carvedilol (COREG) 3.125 MG tablet Take 1 tablet (3.125 mg total) by mouth 2 (two) times daily with a meal. 60 tablet 1  . ferrous sulfate 325 (65 FE) MG tablet Take 325 mg by mouth 3 (three) times daily.  5  . feeding supplement, ENSURE ENLIVE, (ENSURE ENLIVE) LIQD Take 237 mLs by mouth daily. (Patient not taking: Reported on 02/02/2016) 237 mL 12   No current facility-administered medications for this visit.     Review of Systems Review of Systems  Respiratory: Negative.   Cardiovascular: Negative.   Gastrointestinal: Negative.   Genitourinary: Negative.     Blood pressure 102/70, pulse 79, weight 114 lb 12.8 oz (52.1 kg), last menstrual period 01/26/2016, not currently breastfeeding.  Physical Exam Physical Exam  Constitutional: She appears well-developed. No distress.  Pulmonary/Chest: Effort normal. No respiratory distress.  Psychiatric: She has a normal mood and affect. Her behavior is normal.    Data Reviewed CLINICAL DATA:  30 year old female with bilateral hematosalpinx seen on the prior CT of 12/25/2015 and ultrasound of 12/14/2015  EXAM: TRANSABDOMINAL AND TRANSVAGINAL  ULTRASOUND OF PELVIS  TECHNIQUE: Both transabdominal and transvaginal ultrasound examinations of the pelvis were performed. Transabdominal technique was performed for global imaging of the pelvis including uterus, ovaries, adnexal regions, and pelvic cul-de-sac. It was necessary to proceed with endovaginal exam following the transabdominal exam to visualize the endometrium and the ovaries.  COMPARISON:  Ultrasound dated 12/14/2015 and CT dated 12/25/2015  FINDINGS: Uterus  Measurements: 13.3 x 4.7 x 4.9 cm. No fibroids or other mass visualized.  Endometrium  Thickness: 15 mm.  No focal abnormality visualized.  Right ovary  Measurements: 4.2 x 2.8 x 3.2 cm. Normal appearance/no adnexal mass.  Left ovary  Not visualized.  Other findings  Persistent dilated tubular structure with thickened appearing wall and containing uniform and layering low-level echogenic debris in the region of the bilateral adnexa measure approximately 9 cm in diameter grossly similar or slightly decreased compared to the prior ultrasound. Findings likely represent hematosalpinx. Pyosalpinx is less likely given no significant hyperemia noted. Clinical correlation is recommended.  IMPRESSION: Unremarkable uterus.  Persistent probable hematosalpinx grossly similar or slightly decreased compared to the prior ultrasound.   Electronically Signed   By: Elgie CollardArash  Radparvar M.D.   On: 02/01/2016 07:15  Assessment       Persistent probable hematosalpinx grossly similar or slightly decreased compared to the prior ultrasound.  Patient Active Problem List   Diagnosis Date Noted  . Fever 12/25/2015  . 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 UTI due to Klebsiella species   . Cardiomyopathy (HCC) 12/19/2015  . Acute renal  failure (HCC)   . Hydronephrosis, left   . Peripartum cardiomyopathy   . Acute combined systolic and diastolic heart failure (HCC)   . Sepsis (HCC)   . Shock (HCC) 12/14/2015   . AKI (acute kidney injury) (HCC) 12/14/2015  . Jaundice 12/14/2015  . Hepatosplenomegaly 12/14/2015  . Anemia, iron deficiency 12/14/2015     Plan    Expectant management with repeat [place] in 6 weeks Has f/u for cardiomyopathy scheduled       Scheryl DarterJames Arnold 02/02/2016, 2:04 PM

## 2016-02-03 ENCOUNTER — Ambulatory Visit: Payer: Medicaid Other | Admitting: Cardiovascular Disease

## 2016-02-15 ENCOUNTER — Encounter: Payer: Self-pay | Admitting: *Deleted

## 2016-03-13 ENCOUNTER — Ambulatory Visit (HOSPITAL_COMMUNITY)
Admission: RE | Admit: 2016-03-13 | Discharge: 2016-03-13 | Disposition: A | Discharge status: Home / Self Care | Payer: Medicaid Other | Source: Ambulatory Visit | Attending: Obstetrics & Gynecology | Admitting: Obstetrics & Gynecology

## 2016-03-13 ENCOUNTER — Ambulatory Visit (INDEPENDENT_AMBULATORY_CARE_PROVIDER_SITE_OTHER): Payer: Medicaid Other | Admitting: Obstetrics and Gynecology

## 2016-03-13 ENCOUNTER — Encounter: Payer: Self-pay | Admitting: Obstetrics and Gynecology

## 2016-03-13 VITALS — BP 120/77 | HR 61 | Wt 126.0 lb

## 2016-03-13 DIAGNOSIS — N7011 Chronic salpingitis: Secondary | ICD-10-CM

## 2016-03-13 NOTE — Progress Notes (Signed)
31 yo G1P1 here to discuss ultrasound results. Patient reports doing well. She denies any abdominal/pelvic pain. She reports a 3-4 day period monthly associated with some dysmenorrhea which she treats with tylenol.   Past Medical History:  Diagnosis Date  . Medical history non-contributory    Past Surgical History:  Procedure Laterality Date  . NO PAST SURGERIES     Family History  Problem Relation Age of Onset  . Family history unknown: Yes   Social History  Substance Use Topics  . Smoking status: Never Smoker  . Smokeless tobacco: Never Used  . Alcohol use No   ROS See pertinent in HPI  Blood pressure 120/77, pulse 61, weight 126 lb (57.2 kg), not currently breastfeeding. GENERAL: Well-developed, well-nourished female in no acute distress.  ABDOMEN: Soft, nontender, nondistended. No organomegaly. PELVIC: Not performed EXTREMITIES: No cyanosis, clubbing, or edema, 2+ distal pulses.   CLINICAL DATA:  Hydrosalpinx  EXAM: TRANSABDOMINAL AND TRANSVAGINAL ULTRASOUND OF PELVIS  TECHNIQUE: Both transabdominal and transvaginal ultrasound examinations of the pelvis were performed. Transabdominal technique was performed for global imaging of the pelvis including uterus, ovaries, adnexal regions, and pelvic cul-de-sac. It was necessary to proceed with endovaginal exam following the transabdominal exam to visualize the endometrium, ovaries, and LEFT adnexa.  COMPARISON:  01/31/2016  FINDINGS: Uterus  Measurements: 11.7 x 5.0 x 4.9 cm. Normal morphology without mass.  Endometrium  Thickness: 24 mm, abnormally thickened. No focal abnormality or endometrial fluid  Right ovary  Not definitely visualized likely due to a combination of hydrosalpinx and bowel  Left ovary  Not definitely visualized likely due to a combination of hydrosalpinx and bowel  Other findings  Small amount of nonspecific free fluid.  Persistent dilatation of LEFT fallopian tube  measuring 12.9 x 10.4 x 10.4 cm containing diffuse low level internal echoes, unchanged, favor endometriomas.  In RIGHT adnexa, dilated RIGHT fallopian tube 8.6 x 5.0 x 4.4 cm containing diffuse low level internal echoes, unchanged, favor endometriomas.  No new adnexal masses.  IMPRESSION: Dilated fallopian tubes containing diffuse low level internal echogenicity with appearance favoring endometriomas bilaterally.  Thickened endometrial complex 24 mm thick; endometrial thickness is considered abnormal. Consider follow-up by US in 6-8 weeks, during the week immediately following menses (exam timing is critical).   Electronically Signed   By: Ulyses SouthwardMark  Boles M.D.   On: 03/13/2016 14:46  A/P 31 yo with bilateral hydrosalpinx - patient is asymptomatic - Will repeat ultrasound in 8 weeks (1 week following her menses as recommended) - RTC to discuss results

## 2016-03-13 NOTE — Progress Notes (Addendum)
Pt called and advised to call the office the day her period starts in March.  Pt stated that she will keep track of when to call the office. Per provider, schedule US in 8 weeks (1 week after LMP).

## 2016-03-15 ENCOUNTER — Ambulatory Visit: Payer: Medicaid Other | Admitting: Obstetrics & Gynecology

## 2016-03-20 ENCOUNTER — Ambulatory Visit (HOSPITAL_COMMUNITY): Payer: Medicaid Other | Attending: Cardiology

## 2016-03-20 ENCOUNTER — Other Ambulatory Visit (HOSPITAL_COMMUNITY): Payer: Self-pay | Admitting: Urology

## 2016-03-20 ENCOUNTER — Other Ambulatory Visit: Payer: Self-pay

## 2016-03-20 ENCOUNTER — Other Ambulatory Visit: Payer: Self-pay | Admitting: Cardiovascular Disease

## 2016-03-20 DIAGNOSIS — I340 Nonrheumatic mitral (valve) insufficiency: Secondary | ICD-10-CM | POA: Diagnosis not present

## 2016-03-20 DIAGNOSIS — I5041 Acute combined systolic (congestive) and diastolic (congestive) heart failure: Secondary | ICD-10-CM | POA: Insufficient documentation

## 2016-03-20 DIAGNOSIS — I429 Cardiomyopathy, unspecified: Secondary | ICD-10-CM | POA: Diagnosis present

## 2016-03-20 DIAGNOSIS — O903 Peripartum cardiomyopathy: Secondary | ICD-10-CM | POA: Diagnosis not present

## 2016-03-20 DIAGNOSIS — I509 Heart failure, unspecified: Secondary | ICD-10-CM | POA: Insufficient documentation

## 2016-03-20 DIAGNOSIS — N1339 Other hydronephrosis: Secondary | ICD-10-CM

## 2016-03-20 LAB — ECHOCARDIOGRAM LIMITED
AVLVOTPG: 3 mmHg
CHL CUP DOP CALC LVOT VTI: 15.4 cm
CHL CUP MV DEC (S): 218
EERAT: 7.24
EWDT: 218 ms
FS: 28 % (ref 28–44)
IVS/LV PW RATIO, ED: 1.13
LA diam index: 1.74 cm/m2
LASIZE: 28 mm
LEFT ATRIUM END SYS DIAM: 28 mm
LV E/e' medial: 7.24
LV TDI E'MEDIAL: 7.99
LV dias vol index: 64 mL/m2
LV dias vol: 103 mL (ref 46–106)
LV e' LATERAL: 10.5 cm/s
LV sys vol: 54 mL — AB (ref 14–42)
LVEEAVG: 7.24
LVOT area: 3.46 cm2
LVOT diameter: 21 mm
LVOT peak vel: 86.5 cm/s
LVOTSV: 53 mL
LVSYSVOLIN: 34 mL/m2
MV Peak grad: 2 mmHg
MV pk A vel: 45.9 m/s
MVPKEVEL: 76 m/s
PW: 8.11 mm — AB (ref 0.6–1.1)
Simpson's disk: 48
Stroke v: 49 ml
TDI e' lateral: 10.5

## 2016-03-27 ENCOUNTER — Encounter: Payer: Self-pay | Admitting: Cardiovascular Disease

## 2016-03-27 ENCOUNTER — Ambulatory Visit (INDEPENDENT_AMBULATORY_CARE_PROVIDER_SITE_OTHER): Payer: Self-pay | Admitting: Cardiovascular Disease

## 2016-03-27 DIAGNOSIS — I5042 Chronic combined systolic (congestive) and diastolic (congestive) heart failure: Secondary | ICD-10-CM

## 2016-03-27 MED ORDER — LISINOPRIL 5 MG PO TABS: 5.0000 mg | ORAL_TABLET | Freq: Every day | ORAL | 3 refills | Status: DC

## 2016-03-27 MED ORDER — METOPROLOL SUCCINATE ER 25 MG PO TB24: ORAL_TABLET | ORAL | 5 refills | Status: DC

## 2016-03-27 NOTE — Patient Instructions (Addendum)
Medication Instructions:  STOP CARVEDILOL   START LISINOPRIL 5 MG DAILY   START METOPROLOL SUCC 25 MG 1/2 TABLET DAILY   Labwork: NONE  Testing/Procedures: NONE  Follow-Up: Your physician recommends that you schedule a follow-up appointment in: 1 MONTH OV  If you need a refill on your cardiac medications before your next appointment, please call your pharmacy.

## 2016-03-27 NOTE — Progress Notes (Signed)
Cardiology Office Note   Date:  03/29/2016   Carolyn Lynn, DOB Jul 16, 1985, MRN [id number]  PCP:  Carolyn Glatter, MD  Cardiologist:   Carolyn Si, MD   Chief Complaint  Patient presents with  . Follow-up    2 months;     History of Present Illness: Carolyn Lynn is a 31 y.o. female with peripartum cardiomyopathy who presents for follow up.  Carolyn Lynn delivered a healthy baby 11/13/15.  She presented to the ED 10/31 with shortness of breath and lower extremity edema.  Echo revealed LVEF 40-45%.  She was noted to have mild left hydronephrosis and enlarged hydrosalpinges and a possible UPJ stone.  Urine cultures were positive for Klebsiella pneumonia.  Blood cultures were 1:2 positive for Staph epi.  During that hospitalization there was also concern for pneumonia.  The hospitalization was also complicated by large bilateral hematosalpinx.  She was noted to have profound anemia with a hemoglobin of 4.9.  She required 5 units of pRBCs.  Her anemia was felt to be both due to bleeding and DIC from sepsis.  Since her last appointment Carolyn Lynn has been doing well. She hasn't noted any shortness of breath. She has been able to return to work. She does note some pinching chest pain that typically occurs after she has worked all day. 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 She doesn't have any symptoms while exerting herself. She has not been getting any formal exercise. Her baby is now 81 months old and doing well. She is not breast-feeding. She hasn't noted any lower extremity edema, orthopnea, or PND. She had a repeat echocardiogram 03/20/16 that revealed LVEF 45-50% with grade 2 diastolic dysfunction. She was also noted to have bileaflet mitral valve prolapse with mild regurgitation.   Past Medical History:  Diagnosis Date  . Chronic combined systolic and diastolic heart failure (HCC) 03/29/2016  . Medical history non-contributory     Past Surgical  History:  Procedure Laterality Date  . NO PAST SURGERIES       Current Outpatient Prescriptions  Medication Sig Dispense Refill  . feeding supplement, ENSURE ENLIVE, (ENSURE ENLIVE) LIQD Take 237 mLs by mouth daily. (Patient not taking: Reported on 02/02/2016) 237 mL 12  . ferrous sulfate 325 (65 FE) MG tablet Take 325 mg by mouth 3 (three) times daily.  5  . lisinopril (PRINIVIL,ZESTRIL) 5 MG tablet Take 1 tablet (5 mg total) by mouth daily. 90 tablet 3  . metoprolol succinate (TOPROL XL) 25 MG 24 hr tablet 1/2 TABLET DAILY 15 tablet 5   No current facility-administered medications for this visit.    Facility-Administered Medications Ordered in Other Visits  Medication Dose Route Frequency Provider Last Rate Last Dose  . technetium mertiatide (MAG3) injection 5.2 millicurie  5.2 millicurie Intravenous Once PRN Carolyn Goody, MD        Allergies:   Patient has no known allergies.    Social History:  The patient  reports that she has never smoked. She has never used smokeless tobacco. She reports that she does not drink alcohol or use drugs.   Family History:  The patient's Family history is unknown by patient.    ROS:  Please see the history of present illness.   Otherwise, review of systems are positive for none.   All other systems are reviewed and negative.    PHYSICAL EXAM: VS:  BP 116/76   Pulse 64  Ht 5\' 4"  (1.626 m)   Wt 56.2 kg (124 lb)   LMP 03/20/2016 (Exact Date)   BMI 21.28 kg/m  , BMI Body mass index is 21.28 kg/m. GENERAL:  Well appearing HEENT:  Pupils equal round and reactive, fundi not visualized, oral mucosa unremarkable NECK:  No jugular venous distention, waveform within normal limits, carotid upstroke brisk and symmetric, no bruits LYMPHATICS:  No cervical adenopathy LUNGS:  Clear to auscultation bilaterally HEART:  RRR.  PMI not displaced or sustained,S1 and S2 within normal limits, no S3, no S4, no clicks, no rubs,  no murmurs ABD:  Flat, positive  bowel sounds normal in frequency in pitch, no bruits, no rebound, no guarding, no midline pulsatile mass, no hepatomegaly, no splenomegaly EXT:  2 plus pulses throughout, no edema, no cyanosis no clubbing SKIN:  No rashes no nodules NEURO:  Cranial nerves II through XII grossly intact, motor grossly intact throughout PSYCH:  Cognitively intact, oriented to person place and time   EKG:  EKG is not ordered today.   Echo 12/15/15: Study Conclusions  - Left ventricle: The cavity size was mildly dilated. Systolic   function was mildly to moderately reduced. The estimated ejection   fraction was in the range of 40% to 45%. Mild diffuse hypokinesis   with no identifiable regional variations. Left ventricular   diastolic function parameters were mostly normal. Only the medial   annulus diastolic velocity was mildly reduced. - Pericardium, extracardiac: A trivial pericardial effusion was   identified.  Echo 03/20/16: LVEF 45-50%. Grade 2 diastolic dysfunction. Bileaflet mitral valve prolapse. Mild mitral regurgitation.   Recent Labs: 12/14/2015: TSH 3.534 12/15/2015: B Natriuretic Peptide 226.9 12/18/2015: Magnesium 1.9 12/26/2015: ALT 13 12/30/2015: BUN 15; Creat 1.22; Hemoglobin 9.5; Platelets 376; Potassium 4.3; Sodium 135    Lipid Panel No results found for: CHOL, TRIG, HDL, CHOLHDL, VLDL, LDLCALC, LDLDIRECT    Wt Readings from Last 3 Encounters:  03/27/16 56.2 kg (124 lb)  03/13/16 57.2 kg (126 lb)  02/02/16 52.1 kg (114 lb 12.8 oz)   Easy   ASSESSMENT AND PLAN:  # Chronic systolic and diastolic heart failure: [name] is doing well. Unfortunately her LVEF has not changed significantly. It is increased from 40-45% up to 45-50%. She remain euvolemic and is feeling better symptomatically. We discussed the fact that given that her LVEF did not recover completely any future pregnancies would be considered high risk and not recommended. She expressed understanding and is not  wanting to have any other children at this time. If she changes her mind she will contact us immediately. Her blood pressure has been stable. We will stop carvedilol and start metoprolol 12.5 mg daily. Also add lisinopril 5 mg daily. Check BMP at follow-up.  # Situational depression.  Mood seems to have improved.  Current medicines are reviewed at length with the patient today.  The patient does not have concerns regarding medicines.  The following changes have been made:  no change  Labs/ tests ordered today include:   No orders of the defined types were placed in this encounter.    Disposition:   FU with Kingsten Enfield C. Duke Salviaandolph, MD, [facility] in 1 month   This note was written with the assistance of speech recognition software.  Please excuse any transcriptional errors.  Signed, Arnav Cregg C. Duke Salviaandolph, MD, [facility]  03/29/2016 6:17 PM    [facility]

## 2016-03-28 ENCOUNTER — Encounter (HOSPITAL_COMMUNITY)
Admission: RE | Admit: 2016-03-28 | Discharge: 2016-03-28 | Disposition: A | Discharge status: Home / Self Care | Payer: Medicaid Other | Source: Ambulatory Visit | Attending: Urology | Admitting: Urology

## 2016-03-28 ENCOUNTER — Encounter (HOSPITAL_COMMUNITY): Payer: Self-pay | Admitting: Radiology

## 2016-03-28 DIAGNOSIS — N1339 Other hydronephrosis: Secondary | ICD-10-CM | POA: Diagnosis not present

## 2016-03-28 MED ORDER — FUROSEMIDE 10 MG/ML IJ SOLN
28.0000 mg | Freq: Once | INTRAMUSCULAR | Status: AC
  Administered 2016-03-28: 28 mg via INTRAVENOUS

## 2016-03-28 MED ORDER — FUROSEMIDE 10 MG/ML IJ SOLN
INTRAMUSCULAR | Status: AC
  Filled 2016-03-28: qty 4

## 2016-03-28 MED ORDER — TECHNETIUM TC 99M MERTIATIDE: 5.2000 | Freq: Once | INTRAVENOUS | Status: DC | PRN

## 2016-03-29 ENCOUNTER — Ambulatory Visit: Payer: Self-pay | Admitting: Cardiovascular Disease

## 2016-03-29 ENCOUNTER — Encounter: Payer: Self-pay | Admitting: Cardiovascular Disease

## 2016-03-29 DIAGNOSIS — I5042 Chronic combined systolic (congestive) and diastolic (congestive) heart failure: Secondary | ICD-10-CM

## 2016-03-29 HISTORY — DX: Chronic combined systolic (congestive) and diastolic (congestive) heart failure: I50.42

## 2016-04-05 ENCOUNTER — Encounter: Payer: Self-pay | Admitting: Obstetrics & Gynecology

## 2016-04-05 ENCOUNTER — Ambulatory Visit (INDEPENDENT_AMBULATORY_CARE_PROVIDER_SITE_OTHER): Payer: Self-pay | Admitting: Obstetrics & Gynecology

## 2016-04-05 VITALS — BP 107/79 | HR 79 | Wt 123.1 lb

## 2016-04-05 DIAGNOSIS — N7011 Chronic salpingitis: Secondary | ICD-10-CM

## 2016-04-05 DIAGNOSIS — R1900 Intra-abdominal and pelvic swelling, mass and lump, unspecified site: Secondary | ICD-10-CM

## 2016-04-05 NOTE — Patient Instructions (Signed)
Endometriosis Endometriosis is a condition in which the tissue that lines the uterus (endometrium) grows outside of its normal location. The tissue may grow in many locations close to the uterus, but it commonly grows on the ovaries, fallopian tubes, vagina, or bowel. When the uterus sheds the endometrium every menstrual cycle, there is bleeding wherever the endometrial tissue is located. This can cause pain because blood is irritating to tissues that are not normally exposed to it. What are the causes? The cause of endometriosis is not known. What increases the risk? You may be more likely to develop endometriosis if you:  Have a family history of endometriosis.  Have never given birth.  Started your period at age 10 or younger.  Have high levels of estrogen in your body.  Were exposed to a certain medicine (diethylstilbestrol) before you were born (in utero).  Had low birth weight.  Were born as a twin, triplet, or other multiple.  Have a BMI of less than 25. BMI is an estimate of body fat and is calculated from height and weight. What are the signs or symptoms? Often, there are no symptoms of this condition. If you do have symptoms, they may:  Vary depending on where your endometrial tissue is growing.  Occur during your menstrual period (most common) or midcycle.  Come and go, or you may go months with no symptoms at all.  Stop with menopause. Symptoms may include:  Pain in the back or abdomen.  Heavier bleeding during periods.  Pain during sex.  Painful bowel movements.  Infertility.  Pelvic pain.  Bleeding more than once a month. How is this diagnosed? This condition is diagnosed based on your symptoms and a physical exam. You may have tests, such as:  Blood tests and urine tests. These may be done to help rule out other possible causes of your symptoms.  Ultrasound, to look for abnormal tissues.  An X-ray of the lower bowel (barium enema).  An ultrasound  that is done through the vagina (transvaginally).  CT scan.  MRI.  Laparoscopy. In this procedure, a lighted, pencil-sized instrument called a laparoscope is inserted into your abdomen through an incision. The laparoscope allows your health care provider to look at the organs inside your body and check for abnormal tissue to confirm the diagnosis. If abnormal tissue is found, your health care provider may remove a small piece of tissue (biopsy) to be examined under a microscope. How is this treated? Treatment for this condition may include:  Medicines to relieve pain, such as NSAIDs.  Hormone therapy. This involves using artificial (synthetic) hormones to reduce endometrial tissue growth. Your health care provider may recommend using a hormonal form of birth control, or other medicines.  Surgery. This may be done to remove abnormal endometrial tissue.  In some cases, tissue may be removed using a laparoscope and a laser (laparoscopic laser treatment).  In severe cases, surgery may be done to remove the fallopian tubes, uterus, and ovaries (hysterectomy). Follow these instructions at home:  Take over-the-counter and prescription medicines only as told by your health care provider.  Do not drive or use heavy machinery while taking prescription pain medicine.  Try to avoid activities that cause pain, including sexual activity.  Keep all follow-up visits as told by your health care provider. This is important. Contact a health care provider if:  You have pain in the area between your hip bones (pelvic area) that occurs:  Before, during, or after your period.  In   between your period and gets worse during your period.  During or after sex.  With bowel movements or urination, especially during your period.  You have problems getting pregnant.  You have a fever. Get help right away if:  You have severe pain that does not get better with medicine.  You have severe nausea and  vomiting, or you cannot eat without vomiting.  You have pain that affects only the lower, right side of your abdomen.  You have abdominal pain that gets worse.  You have abdominal swelling.  You have blood in your stool. This information is not intended to replace advice given to you by your health care provider. Make sure you discuss any questions you have with your health care provider. Document Released: 01/27/2000 Document Revised: 11/04/2015 Document Reviewed: 07/02/2015 Elsevier Interactive Patient Education  2017 [employer]  

## 2016-04-05 NOTE — Progress Notes (Signed)
Patient ID: Carolyn Lynn, female   DOB: 06/16/1985, 31 y.o.   MRN: [id number]  Chief Complaint  Patient presents with  . surgical consult    HPI Carolyn Lynn is a 31 y.o. female.  G1P1001 Patient's last menstrual period was 03/20/2016 (exact date). The patient has persistent bilateral hydrosalpinges with no pain but mass effect. She is followed by urology and cardiology. HPI  Past Medical History:  Diagnosis Date  . Chronic combined systolic and diastolic heart failure (HCC) 03/29/2016  . Medical history non-contributory     Past Surgical History:  Procedure Laterality Date  . NO PAST SURGERIES      Family History  Problem Relation Age of Onset  . Family history unknown: Yes    Social History Social History  Substance Use Topics  . Smoking status: Never Smoker  . Smokeless tobacco: Never Used  . Alcohol use No    No Known Allergies  Current Outpatient Prescriptions  Medication Sig Dispense Refill  . ferrous sulfate 325 (65 FE) MG tablet Take 325 mg by mouth 3 (three) times daily.  5  . lisinopril (PRINIVIL,ZESTRIL) 5 MG tablet Take 1 tablet (5 mg total) by mouth daily. 90 tablet 3  . metoprolol succinate (TOPROL XL) 25 MG 24 hr tablet 1/2 TABLET DAILY 15 tablet 5  . feeding supplement, ENSURE ENLIVE, (ENSURE ENLIVE) LIQD Take 237 mLs by mouth daily. (Patient not taking: Reported on 02/02/2016) 237 mL 12   No current facility-administered medications for this visit.     Review of Systems Review of Systems  Constitutional: Negative.   Respiratory: Negative.   Gastrointestinal: Negative.   Genitourinary: Negative.     Blood pressure 107/79, pulse 79, weight 123 lb 1.6 oz (55.8 kg), last menstrual period 03/20/2016, not currently breastfeeding.  Physical Exam Physical Exam  Constitutional: She appears well-developed. No distress.  Abdominal: Soft. She exhibits mass (soft non tender below umbilicus).  Psychiatric: She has a normal mood and affect. Her  behavior is normal.    Data Reviewed CLINICAL DATA:  Hydrosalpinx  EXAM: TRANSABDOMINAL AND TRANSVAGINAL ULTRASOUND OF PELVIS  TECHNIQUE: Both transabdominal and transvaginal ultrasound examinations of the pelvis were performed. Transabdominal technique was performed for global imaging of the pelvis including uterus, ovaries, adnexal regions, and pelvic cul-de-sac. It was necessary to proceed with endovaginal exam following the transabdominal exam to visualize the endometrium, ovaries, and LEFT adnexa.  COMPARISON:  01/31/2016  FINDINGS: Uterus  Measurements: 11.7 x 5.0 x 4.9 cm. Normal morphology without mass.  Endometrium  Thickness: 24 mm, abnormally thickened. No focal abnormality or endometrial fluid  Right ovary  Not definitely visualized likely due to a combination of hydrosalpinx and bowel  Left ovary  Not definitely visualized likely due to a combination of hydrosalpinx and bowel  Other findings  Small amount of nonspecific free fluid.  Persistent dilatation of LEFT fallopian tube measuring 12.9 x 10.4 x 10.4 cm containing diffuse low level internal echoes, unchanged, favor endometriomas.  In RIGHT adnexa, dilated RIGHT fallopian tube 8.6 x 5.0 x 4.4 cm containing diffuse low level internal echoes, unchanged, favor endometriomas.  No new adnexal masses.  IMPRESSION: Dilated fallopian tubes containing diffuse low level internal echogenicity with appearance favoring endometriomas bilaterally.  Thickened endometrial complex 24 mm thick; endometrial thickness is considered abnormal. Consider follow-up by US in 6-8 weeks, during the week immediately following menses (exam timing is critical).   Electronically Signed   By: Ulyses SouthwardMark  Boles M.D.   On: 03/13/2016 14:46  Assessment    Bilateral adnexal masses and some left renal involvement, needs resection.     Plan    Schedule laparoscopy and possible laparotomy.   The  risks of surgery were discussed in detail with the patient including but not limited to: bleeding which may require transfusion or reoperation; infection which may require prolonged hospitalization or re-hospitalization and antibiotic therapy; injury to bowel, bladder, ureters and major vessels or other surrounding organs; need for additional procedures including laparotomy; thromboembolic phenomenon, incisional problems and other postoperative or anesthesia complications.  Patient was told that the likelihood that her condition and symptoms will be treated effectively with this surgical management was very high; the postoperative expectations were also discussed in detail. The patient also understands the alternative treatment options which were discussed in full. All questions were answered.  She was told that she will be contacted by our surgical scheduler regarding the time and date of her surgery; routine preoperative instructions of having nothing to eat or drink after midnight on the day prior to surgery and also coming to the hospital 1.5 hours prior to her time of surgery were also emphasized.  She was told she may be called for a preoperative appointment about a week prior to surgery and will be given further preoperative instructions at that visit. Printed patient education handouts about the procedure were given to the patient to review at home.     Scheryl Darter 04/05/2016, 1:21 PM

## 2016-04-07 ENCOUNTER — Encounter: Payer: Self-pay | Admitting: Obstetrics and Gynecology

## 2016-04-07 ENCOUNTER — Encounter: Payer: Self-pay | Admitting: Internal Medicine

## 2016-04-09 NOTE — Telephone Encounter (Signed)
Patient concern

## 2016-04-17 ENCOUNTER — Encounter (HOSPITAL_COMMUNITY): Payer: Self-pay

## 2016-04-17 ENCOUNTER — Telehealth: Payer: Self-pay | Admitting: Cardiovascular Disease

## 2016-04-17 NOTE — Telephone Encounter (Signed)
Spoke with pt she is having surgery on 05-03-16 for possible fallopian tube blockage-hydrosalpinges-possible Laparotomy. She states that she is not having any cardiac symptoms (nausea, vomiting, chest pain or pressure, shortness of breath, edema, no lightheaded or dizziness) she just wanted to make sure that she does not have to be seen to discuss surgey. Informed pt that surgeon will call us for cardiac clearance before surgery is scheduled. She would like to know what Dr Duke Salviaandolph thinks, please advise

## 2016-04-17 NOTE — Telephone Encounter (Signed)
New Message     Pt would like to speak directly with Dr Duke Salviaandolph, she has questions on the surgery she is to have. Is this surgery safe for her to have

## 2016-04-18 NOTE — Telephone Encounter (Signed)
Acceptable risk for surgery 

## 2016-04-20 NOTE — Telephone Encounter (Signed)
Patient made aware and verbalized understanding.

## 2016-04-23 ENCOUNTER — Other Ambulatory Visit: Payer: Self-pay | Admitting: Obstetrics & Gynecology

## 2016-04-23 ENCOUNTER — Encounter (HOSPITAL_COMMUNITY)
Admission: RE | Admit: 2016-04-23 | Discharge: 2016-04-23 | Disposition: A | Discharge status: Home / Self Care | Payer: Medicaid Other | Source: Ambulatory Visit | Attending: Obstetrics & Gynecology | Admitting: Obstetrics & Gynecology

## 2016-04-23 ENCOUNTER — Encounter (HOSPITAL_COMMUNITY): Payer: Self-pay

## 2016-04-23 DIAGNOSIS — Z01812 Encounter for preprocedural laboratory examination: Secondary | ICD-10-CM | POA: Insufficient documentation

## 2016-04-23 HISTORY — DX: Anemia, unspecified: D64.9

## 2016-04-23 HISTORY — DX: Personal history of other medical treatment: Z92.89

## 2016-04-23 HISTORY — DX: Chronic kidney disease, unspecified: N18.9

## 2016-04-23 LAB — BASIC METABOLIC PANEL
ANION GAP: 5 (ref 5–15)
BUN: 13 mg/dL (ref 6–20)
CO2: 27 mmol/L (ref 22–32)
Calcium: 9.2 mg/dL (ref 8.9–10.3)
Chloride: 105 mmol/L (ref 101–111)
Creatinine, Ser: 0.96 mg/dL (ref 0.44–1.00)
Glucose, Bld: 84 mg/dL (ref 65–99)
Potassium: 4 mmol/L (ref 3.5–5.1)
SODIUM: 137 mmol/L (ref 135–145)

## 2016-04-23 LAB — CBC
HEMATOCRIT: 41.5 % (ref 36.0–46.0)
Hemoglobin: 13.1 g/dL (ref 12.0–15.0)
MCH: 26.4 pg (ref 26.0–34.0)
MCHC: 31.6 g/dL (ref 30.0–36.0)
MCV: 83.5 fL (ref 78.0–100.0)
PLATELETS: 157 10*3/uL (ref 150–400)
RBC: 4.97 MIL/uL (ref 3.87–5.11)
RDW: 13.4 % (ref 11.5–15.5)
WBC: 5.1 10*3/uL (ref 4.0–10.5)

## 2016-04-23 LAB — TYPE AND SCREEN
ABO/RH(D): B POS
ANTIBODY SCREEN: NEGATIVE

## 2016-04-23 NOTE — Pre-Procedure Instructions (Signed)
Reviewed Ms. Carolyn Lynn's medical history with Dr. Cristela BlueKyle Jackson, no new orders received.  [facility]

## 2016-04-23 NOTE — Patient Instructions (Signed)
Your procedure is scheduled on:  Thursday, May 03, 2016  [facility]  [facility]00 PM  Pick up the phone at the desk and dial [phone].  Call this number if you have problems the morning of surgery: [phone].  Remember: Do NOT eat food:  After 6:30 AM day of surgery  Do NOT drink clear liquids after:  10:30 AM day of surgery  Take these medicines the morning of surgery with a SIP OF WATER:  Lisinopril, Metoprolol  Stop ALL herbal medications and Naproxen at this time  Do NOT smoke the day of surgery.  Do NOT wear jewelry (body piercing), metal hair clips/bobby pins, make-up, or nail polish. Do NOT wear lotions, powders, or perfumes.  You may wear deodorant. Do NOT shave for 48 hours prior to surgery. Do NOT bring valuables to the hospital. Contacts, dentures, or bridgework may not be worn into surgery.  Leave suitcase in car.  After surgery it may be brought to your room.  For patients admitted to the hospital, checkout time is 11:00 AM the day of discharge.  Have a responsible adult drive you home and stay with you for 24 hours after your procedure  Bring a copy of your healthcare power of attorney and living will documents.  **Effective Friday, Jan. 12, 2018, [place] will implement no hospital visitations from children age 30 and younger due to a steady increase in flu activity in our community and hospitals. **

## 2016-05-03 ENCOUNTER — Inpatient Hospital Stay (HOSPITAL_COMMUNITY)
Admission: RE | Admit: 2016-05-03 | Discharge: 2016-05-05 | DRG: 742 | Disposition: A | Discharge status: Home / Self Care | Payer: Medicaid Other | Source: Ambulatory Visit | Attending: Obstetrics & Gynecology | Admitting: Obstetrics & Gynecology

## 2016-05-03 ENCOUNTER — Encounter (HOSPITAL_COMMUNITY): Payer: Self-pay

## 2016-05-03 ENCOUNTER — Encounter (HOSPITAL_COMMUNITY)
Admission: RE | Disposition: A | Discharge status: Home / Self Care | Payer: Self-pay | Source: Ambulatory Visit | Attending: Obstetrics & Gynecology

## 2016-05-03 ENCOUNTER — Ambulatory Visit (HOSPITAL_COMMUNITY): Payer: Medicaid Other | Admitting: Registered Nurse

## 2016-05-03 DIAGNOSIS — I5042 Chronic combined systolic (congestive) and diastolic (congestive) heart failure: Secondary | ICD-10-CM | POA: Diagnosis present

## 2016-05-03 DIAGNOSIS — Z5331 Laparoscopic surgical procedure converted to open procedure: Secondary | ICD-10-CM

## 2016-05-03 DIAGNOSIS — N7011 Chronic salpingitis: Secondary | ICD-10-CM | POA: Diagnosis present

## 2016-05-03 DIAGNOSIS — N7093 Salpingitis and oophoritis, unspecified: Secondary | ICD-10-CM | POA: Diagnosis present

## 2016-05-03 DIAGNOSIS — I10 Essential (primary) hypertension: Secondary | ICD-10-CM | POA: Diagnosis present

## 2016-05-03 HISTORY — PX: LAPAROTOMY: SHX154

## 2016-05-03 HISTORY — PX: SALPINGOOPHORECTOMY: SHX82

## 2016-05-03 HISTORY — PX: LAPAROSCOPY: SHX197

## 2016-05-03 LAB — PREGNANCY, URINE: Preg Test, Ur: NEGATIVE

## 2016-05-03 SURGERY — LAPAROSCOPY, DIAGNOSTIC
Anesthesia: General | Site: Abdomen

## 2016-05-03 MED ORDER — HYDROMORPHONE HCL 1 MG/ML IJ SOLN
INTRAMUSCULAR | Status: AC
  Filled 2016-05-03: qty 1

## 2016-05-03 MED ORDER — CEFAZOLIN SODIUM-DEXTROSE 2-3 GM-% IV SOLR
INTRAVENOUS | Status: DC | PRN
  Administered 2016-05-03: 2 g via INTRAVENOUS

## 2016-05-03 MED ORDER — IBUPROFEN 600 MG PO TABS
600.0000 mg | ORAL_TABLET | Freq: Four times a day (QID) | ORAL | Status: DC | PRN
  Administered 2016-05-04 (×2): 600 mg via ORAL
  Filled 2016-05-03 (×2): qty 1

## 2016-05-03 MED ORDER — LACTATED RINGERS IV SOLN
INTRAVENOUS | Status: DC
  Administered 2016-05-03 – 2016-05-05 (×4): via INTRAVENOUS

## 2016-05-03 MED ORDER — HYDROMORPHONE HCL 1 MG/ML IJ SOLN
0.2500 mg | INTRAMUSCULAR | Status: DC | PRN
Start: 2016-05-03 — End: 2016-05-03
  Administered 2016-05-03 (×5): 0.5 mg via INTRAVENOUS

## 2016-05-03 MED ORDER — BUPIVACAINE HCL (PF) 0.5 % IJ SOLN
INTRAMUSCULAR | Status: AC
  Filled 2016-05-03: qty 30

## 2016-05-03 MED ORDER — HYDROMORPHONE HCL 1 MG/ML IJ SOLN: 0.5000 mg | INTRAMUSCULAR | Status: DC | PRN

## 2016-05-03 MED ORDER — ROCURONIUM BROMIDE 10 MG/ML (PF) SYRINGE
PREFILLED_SYRINGE | INTRAVENOUS | Status: DC | PRN
  Administered 2016-05-03: 10 mg via INTRAVENOUS
  Administered 2016-05-03: 50 mg via INTRAVENOUS

## 2016-05-03 MED ORDER — FENTANYL CITRATE (PF) 250 MCG/5ML IJ SOLN
INTRAMUSCULAR | Status: AC
  Filled 2016-05-03: qty 5

## 2016-05-03 MED ORDER — ZOLPIDEM TARTRATE 5 MG PO TABS: 5.0000 mg | ORAL_TABLET | Freq: Every evening | ORAL | Status: DC | PRN

## 2016-05-03 MED ORDER — PROPOFOL 10 MG/ML IV BOLUS
INTRAVENOUS | Status: DC | PRN
  Administered 2016-05-03: 20 mg via INTRAVENOUS
  Administered 2016-05-03: 120 mg via INTRAVENOUS

## 2016-05-03 MED ORDER — ONDANSETRON HCL 4 MG PO TABS: 4.0000 mg | ORAL_TABLET | Freq: Four times a day (QID) | ORAL | Status: DC | PRN

## 2016-05-03 MED ORDER — SCOPOLAMINE 1 MG/3DAYS TD PT72
1.0000 | MEDICATED_PATCH | Freq: Once | TRANSDERMAL | Status: DC
  Administered 2016-05-03: 1.5 mg via TRANSDERMAL

## 2016-05-03 MED ORDER — FENTANYL CITRATE (PF) 100 MCG/2ML IJ SOLN
INTRAMUSCULAR | Status: DC | PRN
  Administered 2016-05-03 (×2): 50 ug via INTRAVENOUS
  Administered 2016-05-03: 100 ug via INTRAVENOUS
  Administered 2016-05-03: 50 ug via INTRAVENOUS

## 2016-05-03 MED ORDER — SCOPOLAMINE 1 MG/3DAYS TD PT72
MEDICATED_PATCH | TRANSDERMAL | Status: AC
  Administered 2016-05-03: 1.5 mg via TRANSDERMAL
  Filled 2016-05-03: qty 1

## 2016-05-03 MED ORDER — MIDAZOLAM HCL 2 MG/2ML IJ SOLN
INTRAMUSCULAR | Status: AC
  Filled 2016-05-03: qty 2

## 2016-05-03 MED ORDER — SUGAMMADEX SODIUM 200 MG/2ML IV SOLN
INTRAVENOUS | Status: AC
  Filled 2016-05-03: qty 2

## 2016-05-03 MED ORDER — ACETAMINOPHEN 10 MG/ML IV SOLN
1000.0000 mg | Freq: Once | INTRAVENOUS | Status: AC
  Administered 2016-05-03: 1000 mg via INTRAVENOUS
  Filled 2016-05-03: qty 100

## 2016-05-03 MED ORDER — MIDAZOLAM HCL 5 MG/5ML IJ SOLN
INTRAMUSCULAR | Status: DC | PRN
  Administered 2016-05-03: 2 mg via INTRAVENOUS

## 2016-05-03 MED ORDER — LIDOCAINE 2% (20 MG/ML) 5 ML SYRINGE
INTRAMUSCULAR | Status: DC | PRN
  Administered 2016-05-03: 50 mg via INTRAVENOUS

## 2016-05-03 MED ORDER — DEXTROSE 5 % IV SOLN
1.0000 g | Freq: Four times a day (QID) | INTRAVENOUS | Status: DC
  Administered 2016-05-03 – 2016-05-05 (×6): 1 g via INTRAVENOUS
  Filled 2016-05-03 (×7): qty 1

## 2016-05-03 MED ORDER — PROPOFOL 10 MG/ML IV BOLUS
INTRAVENOUS | Status: AC
  Filled 2016-05-03: qty 20

## 2016-05-03 MED ORDER — DEXAMETHASONE SODIUM PHOSPHATE 10 MG/ML IJ SOLN
INTRAMUSCULAR | Status: DC | PRN
Start: 2016-05-03 — End: 2016-05-03
  Administered 2016-05-03: 10 mg via INTRAVENOUS

## 2016-05-03 MED ORDER — HEPARIN SODIUM (PORCINE) 5000 UNIT/ML IJ SOLN
INTRAMUSCULAR | Status: AC
  Filled 2016-05-03: qty 1

## 2016-05-03 MED ORDER — SUGAMMADEX SODIUM 200 MG/2ML IV SOLN
INTRAVENOUS | Status: DC | PRN
  Administered 2016-05-03: 110 mg via INTRAVENOUS

## 2016-05-03 MED ORDER — PRENATAL MULTIVITAMIN CH
1.0000 | ORAL_TABLET | Freq: Every day | ORAL | Status: DC
  Administered 2016-05-04: 1 via ORAL
  Filled 2016-05-03: qty 1

## 2016-05-03 MED ORDER — HYDROMORPHONE HCL 1 MG/ML IJ SOLN
1.0000 mg | INTRAMUSCULAR | Status: DC | PRN
  Administered 2016-05-03 – 2016-05-04 (×2): 1 mg via INTRAVENOUS
  Filled 2016-05-03 (×2): qty 1

## 2016-05-03 MED ORDER — ONDANSETRON HCL 4 MG/2ML IJ SOLN
INTRAMUSCULAR | Status: AC
  Filled 2016-05-03: qty 2

## 2016-05-03 MED ORDER — LACTATED RINGERS IV SOLN
INTRAVENOUS | Status: DC
  Administered 2016-05-03: 125 mL/h via INTRAVENOUS
  Administered 2016-05-03: 15:00:00 via INTRAVENOUS

## 2016-05-03 MED ORDER — ONDANSETRON HCL 4 MG/2ML IJ SOLN
INTRAMUSCULAR | Status: DC | PRN
  Administered 2016-05-03: 4 mg via INTRAVENOUS

## 2016-05-03 MED ORDER — LACTATED RINGERS IR SOLN
Status: DC | PRN
  Administered 2016-05-03: 3000 mL

## 2016-05-03 MED ORDER — CEFAZOLIN SODIUM-DEXTROSE 2-4 GM/100ML-% IV SOLN
INTRAVENOUS | Status: AC
  Filled 2016-05-03: qty 100

## 2016-05-03 MED ORDER — DEXAMETHASONE SODIUM PHOSPHATE 10 MG/ML IJ SOLN
INTRAMUSCULAR | Status: AC
  Filled 2016-05-03: qty 1

## 2016-05-03 MED ORDER — ROCURONIUM BROMIDE 100 MG/10ML IV SOLN
INTRAVENOUS | Status: AC
  Filled 2016-05-03: qty 1

## 2016-05-03 MED ORDER — ONDANSETRON HCL 4 MG/2ML IJ SOLN
4.0000 mg | Freq: Four times a day (QID) | INTRAMUSCULAR | Status: DC | PRN
  Administered 2016-05-04: 4 mg via INTRAVENOUS
  Filled 2016-05-03: qty 2

## 2016-05-03 SURGICAL SUPPLY — 55 items
ADH SKN CLS APL DERMABOND .7 (GAUZE/BANDAGES/DRESSINGS) ×2
BLADE SURG 10 STRL SS (BLADE) ×6 IMPLANT
CABLE HIGH FREQUENCY MONO STRZ (ELECTRODE) IMPLANT
CANISTER SUCT 3000ML PPV (MISCELLANEOUS) ×4 IMPLANT
CLOSURE WOUND 1/2 X4 (GAUZE/BANDAGES/DRESSINGS) ×1
CLOTH BEACON ORANGE TIMEOUT ST (SAFETY) ×4 IMPLANT
CONT PATH 16OZ SNAP LID 3702 (MISCELLANEOUS) ×4 IMPLANT
DECANTER SPIKE VIAL GLASS SM (MISCELLANEOUS) ×4 IMPLANT
DERMABOND ADVANCED (GAUZE/BANDAGES/DRESSINGS) ×2
DERMABOND ADVANCED .7 DNX12 (GAUZE/BANDAGES/DRESSINGS) IMPLANT
DRAPE WARM FLUID 44X44 (DRAPE) IMPLANT
DRSG OPSITE POSTOP 3X4 (GAUZE/BANDAGES/DRESSINGS) IMPLANT
DRSG OPSITE POSTOP 4X10 (GAUZE/BANDAGES/DRESSINGS) ×2 IMPLANT
DRSG TELFA 3X8 NADH (GAUZE/BANDAGES/DRESSINGS) ×4 IMPLANT
DURAPREP 26ML APPLICATOR (WOUND CARE) ×8 IMPLANT
GAUZE SPONGE 4X4 12PLY STRL LF (GAUZE/BANDAGES/DRESSINGS) ×2 IMPLANT
GAUZE SPONGE 4X4 16PLY XRAY LF (GAUZE/BANDAGES/DRESSINGS) IMPLANT
GLOVE BIO SURGEON STRL SZ 6.5 (GLOVE) ×3 IMPLANT
GLOVE BIO SURGEONS STRL SZ 6.5 (GLOVE) ×1
GLOVE BIOGEL PI IND STRL 7.0 (GLOVE) ×8 IMPLANT
GLOVE BIOGEL PI INDICATOR 7.0 (GLOVE) ×8
GOWN STRL REUS W/TWL LRG LVL3 (GOWN DISPOSABLE) ×12 IMPLANT
NEEDLE INSUFFLATION 120MM (ENDOMECHANICALS) ×4 IMPLANT
NS IRRIG 1000ML POUR BTL (IV SOLUTION) ×4 IMPLANT
PACK ABDOMINAL GYN (CUSTOM PROCEDURE TRAY) ×2 IMPLANT
PACK LAPAROSCOPY BASIN (CUSTOM PROCEDURE TRAY) ×4 IMPLANT
PACK TRENDGUARD 450 HYBRID PRO (MISCELLANEOUS) IMPLANT
PAD ABD 8X7 1/2 STERILE (GAUZE/BANDAGES/DRESSINGS) ×2 IMPLANT
PAD DRESSING TELFA 3X8 NADH (GAUZE/BANDAGES/DRESSINGS) IMPLANT
PAD OB MATERNITY 4.3X12.25 (PERSONAL CARE ITEMS) ×4 IMPLANT
PENCIL BUTTON HOLSTER BLD 10FT (ELECTRODE) ×2 IMPLANT
PROTECTOR NERVE ULNAR (MISCELLANEOUS) ×6 IMPLANT
SET IRRIG TUBING LAPAROSCOPIC (IRRIGATION / IRRIGATOR) ×2 IMPLANT
SHEARS HARMONIC ACE PLUS 36CM (ENDOMECHANICALS) ×2 IMPLANT
SLEEVE XCEL OPT CAN 5 100 (ENDOMECHANICALS) ×2 IMPLANT
SPONGE LAP 18X18 X RAY DECT (DISPOSABLE) ×8 IMPLANT
STAPLER VISISTAT 35W (STAPLE) IMPLANT
STRIP CLOSURE SKIN 1/2X4 (GAUZE/BANDAGES/DRESSINGS) ×1 IMPLANT
SUT VIC AB 0 CT1 18XCR BRD8 (SUTURE) IMPLANT
SUT VIC AB 0 CT1 36 (SUTURE) ×4 IMPLANT
SUT VIC AB 0 CT1 8-18 (SUTURE) ×4
SUT VIC AB 2-0 CT1 27 (SUTURE) ×4
SUT VIC AB 2-0 CT1 TAPERPNT 27 (SUTURE) IMPLANT
SUT VIC AB 4-0 PS2 27 (SUTURE) IMPLANT
SUT VICRYL 0 TIES 12 18 (SUTURE) ×2 IMPLANT
SUT VICRYL 0 UR6 27IN ABS (SUTURE) ×4 IMPLANT
SUT VICRYL 4-0 PS2 18IN ABS (SUTURE) ×4 IMPLANT
TOWEL OR 17X24 6PK STRL BLUE (TOWEL DISPOSABLE) ×8 IMPLANT
TRAY FOLEY CATH SILVER 14FR (SET/KITS/TRAYS/PACK) ×4 IMPLANT
TRAY FOLEY CATH SILVER 16FR (SET/KITS/TRAYS/PACK) ×4 IMPLANT
TRENDGUARD 450 HYBRID PRO PACK (MISCELLANEOUS) ×4
TROCAR XCEL DIL TIP R 11M (ENDOMECHANICALS) ×4 IMPLANT
TROCAR XCEL NON-BLD 5MMX100MML (ENDOMECHANICALS) ×6 IMPLANT
WARMER LAPAROSCOPE (MISCELLANEOUS) ×4 IMPLANT
WATER STERILE IRR 1000ML POUR (IV SOLUTION) ×4 IMPLANT

## 2016-05-03 NOTE — Interval H&P Note (Signed)
History and Physical Interval Note:  05/03/2016 2:02 PM  Carolyn Lynn  has presented today for surgery, with the diagnosis of Hydrosalpinx  The various methods of treatment have been discussed with the patient and family. After consideration of risks, benefits and other options for treatment, the patient has consented to  Procedure(s) with comments: LAPAROSCOPY DIAGNOSTIC (N/A) LAPAROTOMY-POSSIBLE (N/A) - Possible as a surgical intervention .  The patient's history has been reviewed, patient examined, no change in status, stable for surgery.  I have reviewed the patient's chart and labs.  Questions were answered to the patient's satisfaction.   She may need unilateral or bilateral salpingectomy and she understands this might result in sterilization although the tubes will be preserved if possible.  Scheryl DarterJames Arnold

## 2016-05-03 NOTE — OR Nursing (Signed)
Switched from Diagnostic Laparoscopy to Laparotomy at 1550

## 2016-05-03 NOTE — OR Nursing (Signed)
Called and updated patient's mother in surgical waiting room [phone]

## 2016-05-03 NOTE — Anesthesia Postprocedure Evaluation (Signed)
Anesthesia Post Note  Patient: Carolyn Lynn  Procedure(s) Performed: Procedure(s) (LRB): LAPAROSCOPY DIAGNOSTIC (N/A) LAPAROTOMY (N/A) SALPINGO OOPHORECTOMY (Left)  Patient location during evaluation: PACU Anesthesia Type: General Level of consciousness: awake and alert Pain management: pain level controlled Vital Signs Assessment: post-procedure vital signs reviewed and stable Respiratory status: spontaneous breathing, nonlabored ventilation, respiratory function stable and patient connected to nasal cannula oxygen Cardiovascular status: blood pressure returned to baseline and stable Postop Assessment: no signs of nausea or vomiting Anesthetic complications: no        Last Vitals:  Vitals:   05/03/16 1930 05/03/16 2030  BP: (!) 102/54 (!) 100/51  Pulse: 62 (!) 59  Resp: 16 16  Temp: 36.4 C 36.4 C    Last Pain:  Vitals:   05/03/16 1930  TempSrc:   PainSc: 4    Pain Goal: Patients Stated Pain Goal: 3 (05/03/16 1930)               Shelton SilvasKevin D Deazia Lampi

## 2016-05-03 NOTE — Anesthesia Procedure Notes (Signed)
Procedure Name: Intubation Date/Time: 05/03/2016 2:43 PM Performed by: Jhonnie GarnerMARSHALL, Carolyn Mickelson M Pre-anesthesia Checklist: Patient identified, Emergency Drugs available, Suction available and Patient being monitored Patient Re-evaluated:Patient Re-evaluated prior to inductionOxygen Delivery Method: Circle system utilized Preoxygenation: Pre-oxygenation with 100% oxygen Intubation Type: IV induction Ventilation: Mask ventilation without difficulty Laryngoscope Size: Miller and 2 Grade View: Grade I Tube type: Oral Tube size: 7.0 mm Number of attempts: 1 Airway Equipment and Method: Stylet Placement Confirmation: ETT inserted through vocal cords under direct vision,  positive ETCO2 and breath sounds checked- equal and bilateral Secured at: 21 cm Tube secured with: Tape Dental Injury: Teeth and Oropharynx as per pre-operative assessment

## 2016-05-03 NOTE — Transfer of Care (Signed)
Immediate Anesthesia Transfer of Care Note  Patient: Carolyn Lynn  Procedure(s) Performed: Procedure(s): LAPAROSCOPY DIAGNOSTIC (N/A) LAPAROTOMY (N/A) SALPINGO OOPHORECTOMY (Left)  Patient Location: PACU  Anesthesia Type:General  Level of Consciousness: awake and oriented  Airway & Oxygen Therapy: Patient Spontanous Breathing and Patient connected to nasal cannula oxygen  Post-op Assessment: Report given to RN and Post -op Vital signs reviewed and stable  Post vital signs: Reviewed and stable  Last Vitals:  Vitals:   05/03/16 1318  BP: 108/76  Pulse: (!) 59  Resp: 16  Temp: 36.2 C    Last Pain:  Vitals:   05/03/16 1318  TempSrc: Oral      Patients Stated Pain Goal: 3 (05/03/16 1318)  Complications: No apparent anesthesia complications

## 2016-05-03 NOTE — H&P (View-Only) (Signed)
Patient ID: Carolyn Lynn, female   DOB: 06/16/1985, 31 y.o.   MRN: [id number]  Chief Complaint  Patient presents with  . surgical consult    HPI Carolyn Lynn is a 31 y.o. female.  G1P1001 Patient's last menstrual period was 03/20/2016 (exact date). The patient has persistent bilateral hydrosalpinges with no pain but mass effect. She is followed by urology and cardiology. HPI  Past Medical History:  Diagnosis Date  . Chronic combined systolic and diastolic heart failure (HCC) 03/29/2016  . Medical history non-contributory     Past Surgical History:  Procedure Laterality Date  . NO PAST SURGERIES      Family History  Problem Relation Age of Onset  . Family history unknown: Yes    Social History Social History  Substance Use Topics  . Smoking status: Never Smoker  . Smokeless tobacco: Never Used  . Alcohol use No    No Known Allergies  Current Outpatient Prescriptions  Medication Sig Dispense Refill  . ferrous sulfate 325 (65 FE) MG tablet Take 325 mg by mouth 3 (three) times daily.  5  . lisinopril (PRINIVIL,ZESTRIL) 5 MG tablet Take 1 tablet (5 mg total) by mouth daily. 90 tablet 3  . metoprolol succinate (TOPROL XL) 25 MG 24 hr tablet 1/2 TABLET DAILY 15 tablet 5  . feeding supplement, ENSURE ENLIVE, (ENSURE ENLIVE) LIQD Take 237 mLs by mouth daily. (Patient not taking: Reported on 02/02/2016) 237 mL 12   No current facility-administered medications for this visit.     Review of Systems Review of Systems  Constitutional: Negative.   Respiratory: Negative.   Gastrointestinal: Negative.   Genitourinary: Negative.     Blood pressure 107/79, pulse 79, weight 123 lb 1.6 oz (55.8 kg), last menstrual period 03/20/2016, not currently breastfeeding.  Physical Exam Physical Exam  Constitutional: She appears well-developed. No distress.  Abdominal: Soft. She exhibits mass (soft non tender below umbilicus).  Psychiatric: She has a normal mood and affect. Her  behavior is normal.    Data Reviewed CLINICAL DATA:  Hydrosalpinx  EXAM: TRANSABDOMINAL AND TRANSVAGINAL ULTRASOUND OF PELVIS  TECHNIQUE: Both transabdominal and transvaginal ultrasound examinations of the pelvis were performed. Transabdominal technique was performed for global imaging of the pelvis including uterus, ovaries, adnexal regions, and pelvic cul-de-sac. It was necessary to proceed with endovaginal exam following the transabdominal exam to visualize the endometrium, ovaries, and LEFT adnexa.  COMPARISON:  01/31/2016  FINDINGS: Uterus  Measurements: 11.7 x 5.0 x 4.9 cm. Normal morphology without mass.  Endometrium  Thickness: 24 mm, abnormally thickened. No focal abnormality or endometrial fluid  Right ovary  Not definitely visualized likely due to a combination of hydrosalpinx and bowel  Left ovary  Not definitely visualized likely due to a combination of hydrosalpinx and bowel  Other findings  Small amount of nonspecific free fluid.  Persistent dilatation of LEFT fallopian tube measuring 12.9 x 10.4 x 10.4 cm containing diffuse low level internal echoes, unchanged, favor endometriomas.  In RIGHT adnexa, dilated RIGHT fallopian tube 8.6 x 5.0 x 4.4 cm containing diffuse low level internal echoes, unchanged, favor endometriomas.  No new adnexal masses.  IMPRESSION: Dilated fallopian tubes containing diffuse low level internal echogenicity with appearance favoring endometriomas bilaterally.  Thickened endometrial complex 24 mm thick; endometrial thickness is considered abnormal. Consider follow-up by US in 6-8 weeks, during the week immediately following menses (exam timing is critical).   Electronically Signed   By: Ulyses SouthwardMark  Boles M.D.   On: 03/13/2016 14:46  Assessment    Bilateral adnexal masses and some left renal involvement, needs resection.     Plan    Schedule laparoscopy and possible laparotomy.   The  risks of surgery were discussed in detail with the patient including but not limited to: bleeding which may require transfusion or reoperation; infection which may require prolonged hospitalization or re-hospitalization and antibiotic therapy; injury to bowel, bladder, ureters and major vessels or other surrounding organs; need for additional procedures including laparotomy; thromboembolic phenomenon, incisional problems and other postoperative or anesthesia complications.  Patient was told that the likelihood that her condition and symptoms will be treated effectively with this surgical management was very high; the postoperative expectations were also discussed in detail. The patient also understands the alternative treatment options which were discussed in full. All questions were answered.  She was told that she will be contacted by our surgical scheduler regarding the time and date of her surgery; routine preoperative instructions of having nothing to eat or drink after midnight on the day prior to surgery and also coming to the hospital 1.5 hours prior to her time of surgery were also emphasized.  She was told she may be called for a preoperative appointment about a week prior to surgery and will be given further preoperative instructions at that visit. Printed patient education handouts about the procedure were given to the patient to review at home.     Scheryl Darter 04/05/2016, 1:21 PM

## 2016-05-03 NOTE — Op Note (Signed)
Diagnostic Laparoscopy Procedure Note  Indications: The patient is a 31 y.o. female with adnexal masses possible tubal origin. Procedure: Laparoscopy, converted to laparotomy with left salpingo-oophorectomy Pre-operative Diagnosis: Adnexal cystic masses    Post-operative Diagnosis: Left tubo-ovarian abscess  Surgeon: Scheryl Darter MD  Assistants: Dr. Nettie Elm  Anesthesia: General endotracheal anesthesia  ASA Class: 2  Procedure Details  The patient was seen in the Holding Room. The risks, benefits, complications, treatment options, and expected outcomes were discussed with the patient. The possibilities of reaction to medication, pulmonary aspiration, perforation of viscus, bleeding, recurrent infection, the need for additional procedures, failure to diagnose a condition, and creating a complication requiring transfusion or operation were discussed with the patient. The patient concurred with the proposed plan, giving informed consent. The patient was taken to the Operating Room, identified as Carolyn Lynn and the procedure verified as Diagnostic Laparoscopy, possible laparotomy. A Time Out was held and the above information confirmed.  After induction of general anesthesia, the patient was placed in modified dorsal lithotomy position where she was prepped, draped, and catheterized in the normal, sterile fashion.  The cervix was visualized and an intrauterine manipulator was placed. A 5 mcm umbilical incision was then performed. Veress needle was passed and pneumoperitoneum was established. A 5 mm Optiview port was placed. Laparoscope was inserted with video camera and use. A large left-sided pelvic mass was identified. The fallopian tube appeared normal and was overlying the mass. It appeared to be ovarian. 5 mm ports were placed in the left lower quadrant and right lower quadrant. The model scalpel was used to incise over the mass and her length discharge was expressed. This was  suctioned. We attempted to decompress the mass. Adhesions to the bowel posteriorly were taken down carefully using the Harmonic scalpel. When it became clear that the mass would be difficult to remove laparoscopically to report we elected to proceed with laparotomy. #10 blade was used to make a 10 cm transverse lower abdominal incision. This carried down the fascia and the fascial incision was extended transversely. The muscles were separated midline and peritoneum was entered. The peritoneal incision was extended. The bowel was packed back with moist lap pads. Purulent fluid mixed with blood was suctioned. O'Connor-O'Sullivan self-retaining retractor was placed. Babcock clamps were used to elevate the left adnexal mass. The pedicle at the left fallopian tube was cross clamped cut and suture ligated as was the instilled oh pelvic ligament with double ligatures placed. Good hemostasis was seen. The left ureter was visualized and good peristalsis was seen. The mass was separated from surrounding attachments with sharp dissection and with blunt dissection. Hemostasis was obtained along the uterine serosa with figure-of-eight sutures. The dense tissue attachments posteriorly were cross clamped cut and suture ligated and the mass was removed. The left tubo-ovarian abscess was sent to pathology. Hemostasis was assured. The right adnexa was visualized. The right ovary appeared normal and left tube. The ovary measured about 5 cm. He was no sign of an abscess on the right side. The right ureter was also seen with normal peristalsis. The pelvis was copiously irrigated. Arista was placed along the left pelvic sidewall and hemostasis was assured. All packs and retractors were removed. Anterior peritoneum was closed with running suture with 2-0 Vicryl. Fascia was closed with a running suture with 0 Vicryl. Skin was closed with a running suture of 4-0 Vicryl. The laparoscopic incisions were closed with 4-0 Vicryl and with  Dermabond. Right lower quadrant incision had been  extended to 10 mm of this was closed at the fascia with 0 Vicryl. Estimated blood loss was 500 mL. Patient tolerated procedure well and was brought in stable condition to PACU. Following the procedure the umbilical sheath was removed after intra-abdominal carbon dioxide was expressed. The incision was closed with subcutaneous and subcuticular sutures of 4-0 Vicryl. The intrauterine manipulator was then removed.  Instrument, sponge, and needle counts were correct prior to abdominal closure and at the conclusion of the case.   Findings:   Estimated Blood Loss:  500 ml         Drains: Foley catheter         Total IV Fluids: 2000 mL         Specimens: Left tubo-ovarian abscess              Complications:  None; patient tolerated the procedure well.         Disposition: PACU - hemodynamically stable.         Condition: stable  Adam PhenixJames G Avana Kreiser, MD 05/03/2016 5:25 PM

## 2016-05-03 NOTE — Anesthesia Preprocedure Evaluation (Addendum)
Anesthesia Evaluation  Patient identified by MRN, date of birth, ID band Patient awake    Reviewed: Allergy & Precautions, NPO status , Patient's Chart, lab work & pertinent test results, reviewed documented beta blocker date and time   Airway Mallampati: I  TM Distance: >3 FB Neck ROM: Full    Dental  (+) Teeth Intact   Pulmonary neg pulmonary ROS,    breath sounds clear to auscultation       Cardiovascular hypertension, Pt. on medications and Pt. on home beta blockers  Rhythm:Regular Rate:Normal     Neuro/Psych negative neurological ROS  negative psych ROS   GI/Hepatic negative GI ROS, Neg liver ROS,   Endo/Other  negative endocrine ROS  Renal/GU Renal disease  negative genitourinary   Musculoskeletal negative musculoskeletal ROS (+)   Abdominal   Peds negative pediatric ROS (+)  Hematology negative hematology ROS (+)   Anesthesia Other Findings   Reproductive/Obstetrics negative OB ROS                            Anesthesia Physical Anesthesia Plan  ASA: II  Anesthesia Plan: General   Post-op Pain Management:    Induction: Intravenous  Airway Management Planned: Oral ETT  Additional Equipment:   Intra-op Plan:   Post-operative Plan: Extubation in OR  Informed Consent: I have reviewed the patients History and Physical, chart, labs and discussed the procedure including the risks, benefits and alternatives for the proposed anesthesia with the patient or authorized representative who has indicated his/her understanding and acceptance.   Dental advisory given  Plan Discussed with: CRNA  Anesthesia Plan Comments:         Anesthesia Quick Evaluation

## 2016-05-04 ENCOUNTER — Ambulatory Visit: Payer: Self-pay | Admitting: Cardiovascular Disease

## 2016-05-04 LAB — CBC
HCT: 31.9 % — ABNORMAL LOW (ref 36.0–46.0)
Hemoglobin: 10.4 g/dL — ABNORMAL LOW (ref 12.0–15.0)
MCH: 26.5 pg (ref 26.0–34.0)
MCHC: 32.6 g/dL (ref 30.0–36.0)
MCV: 81.4 fL (ref 78.0–100.0)
PLATELETS: 154 10*3/uL (ref 150–400)
RBC: 3.92 MIL/uL (ref 3.87–5.11)
RDW: 13.2 % (ref 11.5–15.5)
WBC: 11.6 10*3/uL — AB (ref 4.0–10.5)

## 2016-05-04 LAB — BASIC METABOLIC PANEL
Anion gap: 7 (ref 5–15)
BUN: 12 mg/dL (ref 6–20)
CHLORIDE: 104 mmol/L (ref 101–111)
CO2: 23 mmol/L (ref 22–32)
CREATININE: 0.84 mg/dL (ref 0.44–1.00)
Calcium: 8.6 mg/dL — ABNORMAL LOW (ref 8.9–10.3)
GFR calc Af Amer: >60 mL/min (ref >60)
GFR calc non Af Amer: >60 mL/min (ref >60)
Glucose, Bld: 144 mg/dL — ABNORMAL HIGH (ref 65–99)
Potassium: 4.2 mmol/L (ref 3.5–5.1)
SODIUM: 134 mmol/L — AB (ref 135–145)

## 2016-05-04 MED ORDER — OXYCODONE-ACETAMINOPHEN 5-325 MG PO TABS
1.0000 | ORAL_TABLET | ORAL | Status: DC | PRN
  Administered 2016-05-04 – 2016-05-05 (×4): 2 via ORAL
  Filled 2016-05-04 (×4): qty 2

## 2016-05-04 NOTE — Addendum Note (Signed)
Addendum  created 05/04/16 1144 by Algis GreenhouseLinda A Antionne Enrique, CRNA   Charge Capture section accepted

## 2016-05-04 NOTE — Addendum Note (Signed)
Addendum  created 05/04/16 0831 by Angela Adamana G Darious Rehman, CRNA   Sign clinical note

## 2016-05-04 NOTE — Anesthesia Postprocedure Evaluation (Addendum)
Anesthesia Post Note  Patient: Rosita KeaJervita J Bertran  Procedure(s) Performed: Procedure(s) (LRB): LAPAROSCOPY DIAGNOSTIC (N/A) LAPAROTOMY (N/A) SALPINGO OOPHORECTOMY (Left)  Patient location during evaluation: [facility] Anesthesia Type: General Level of consciousness: awake and alert Pain management: pain level controlled Vital Signs Assessment: post-procedure vital signs reviewed and stable Respiratory status: spontaneous breathing Cardiovascular status: stable Postop Assessment: no headache, no backache and no signs of nausea or vomiting Anesthetic complications: no Comments: Pain score 2.        Last Vitals:  Vitals:   05/04/16 0500 05/04/16 0735  BP: (!) 102/58 (!) 90/53  Pulse: 75 77  Resp: 16 18  Temp: 36.8 C 36.7 C    Last Pain:  Vitals:   05/04/16 0750  TempSrc:   PainSc: 0-No pain   Pain Goal: Patients Stated Pain Goal: 3 (05/04/16 0552)               Merrilyn PumaWRINKLE,DANA

## 2016-05-04 NOTE — Progress Notes (Signed)
1 Day Post-Op Procedure(s) (LRB): LAPAROSCOPY DIAGNOSTIC (N/A) LAPAROTOMY (N/A) SALPINGO OOPHORECTOMY (Left)  Subjective: Patient reports incisional pain, tolerating PO and no problems voiding.   No flatus Objective: I have reviewed patient's vital signs, intake and output, medications and labs.  General: alert, cooperative and no distress GI: soft, non-tender; bowel sounds normal; no masses,  no organomegaly and incision: clean, dry and intact Extremities: extremities normal, atraumatic, no cyanosis or edema  Assessment: s/p Procedure(s): LAPAROSCOPY DIAGNOSTIC (N/A) LAPAROTOMY (N/A) SALPINGO OOPHORECTOMY (Left): progressing well BMET    Component Value Date/Time   NA 134 (L) 05/04/2016 0525   K 4.2 05/04/2016 0525   CL 104 05/04/2016 0525   CO2 23 05/04/2016 0525   GLUCOSE 144 (H) 05/04/2016 0525   BUN 12 05/04/2016 0525   CREATININE 0.84 05/04/2016 0525   CREATININE 1.22 (H) 12/30/2015 1131   CALCIUM 8.6 (L) 05/04/2016 0525   GFRNONAA >60 05/04/2016 0525   GFRAA >60 05/04/2016 0525   CBC    Component Value Date/Time   WBC 11.6 (H) 05/04/2016 0525   RBC 3.92 05/04/2016 0525   HGB 10.4 (L) 05/04/2016 0525   HCT 31.9 (L) 05/04/2016 0525   PLT 154 05/04/2016 0525   MCV 81.4 05/04/2016 0525   MCH 26.5 05/04/2016 0525   MCHC 32.6 05/04/2016 0525   RDW 13.2 05/04/2016 0525   LYMPHSABS 2.1 12/25/2015 1220   MONOABS 1.0 12/25/2015 1220   EOSABS 0.2 12/25/2015 1220   BASOSABS 0.1 12/25/2015 1220     Plan: Encourage ambulation Advance to PO medication  LOS: 1 day    Scheryl DarterJames Arnold 05/04/2016, 11:24 AM

## 2016-05-05 MED ORDER — OXYCODONE-ACETAMINOPHEN 5-325 MG PO TABS: 1.0000 | ORAL_TABLET | ORAL | 0 refills | Status: DC | PRN

## 2016-05-05 MED ORDER — AMOXICILLIN-POT CLAVULANATE 875-125 MG PO TABS: 1.0000 | ORAL_TABLET | Freq: Two times a day (BID) | ORAL | 1 refills | Status: DC

## 2016-05-05 NOTE — Discharge Summary (Signed)
Physician Discharge Summary  Patient ID: Carolyn Lynn MRN: [id number] DOB/AGE: 30/08/1985 30 y.o.  Admit date: 05/03/2016 Discharge date: 05/05/2016  Admission Diagnoses:Adnexal masses and left hydronephrosis  Discharge Diagnoses: Left tuboovarian abscess Active Problems:   Left tubo-ovarian abscess   Discharged Condition: good  Hospital Course: HPI Carolyn KeaJervita J Morefield is a 31 y.o. female.  G1P1001 Patient's last menstrual period was 03/20/2016 (exact date). The patient has persistent bilateral hydrosalpinges with no pain but mass effect. She is followed by urology and cardiology. HPI      Past Medical History:  Diagnosis Date  . Chronic combined systolic and diastolic heart failure (HCC) 03/29/2016  . Medical history non-contributory          Past Surgical History:  Procedure Laterality Date  . NO PAST SURGERIES           Family History  Problem Relation Age of Onset  . Family history unknown: Yes    Social History     Social History  Substance Use Topics  . Smoking status: Never Smoker  . Smokeless tobacco: Never Used  . Alcohol use No    No Known Allergies        Current Outpatient Prescriptions  Medication Sig Dispense Refill  . ferrous sulfate 325 (65 FE) MG tablet Take 325 mg by mouth 3 (three) times daily.  5  . lisinopril (PRINIVIL,ZESTRIL) 5 MG tablet Take 1 tablet (5 mg total) by mouth daily. 90 tablet 3  . metoprolol succinate (TOPROL XL) 25 MG 24 hr tablet 1/2 TABLET DAILY 15 tablet 5  . feeding supplement, ENSURE ENLIVE, (ENSURE ENLIVE) LIQD Take 237 mLs by mouth daily. (Patient not taking: Reported on 02/02/2016) 237 mL 12   No current facility-administered medications for this visit.     Review of Systems Review of Systems  Constitutional: Negative.   Respiratory: Negative.   Gastrointestinal: Negative.   Genitourinary: Negative.     Blood pressure 107/79, pulse 79, weight 123 lb 1.6 oz (55.8 kg), last menstrual  period 03/20/2016, not currently breastfeeding.  Physical Exam Physical Exam  Constitutional: She appears well-developed. No distress.  Abdominal: Soft. She exhibits mass (soft non tender below umbilicus).  Psychiatric: She has a normal mood and affect. Her behavior is normal.    Data Reviewed CLINICAL DATA: Hydrosalpinx  EXAM: TRANSABDOMINAL AND TRANSVAGINAL ULTRASOUND OF PELVIS  TECHNIQUE: Both transabdominal and transvaginal ultrasound examinations of the pelvis were performed. Transabdominal technique was performed for global imaging of the pelvis including uterus, ovaries, adnexal regions, and pelvic cul-de-sac. It was necessary to proceed with endovaginal exam following the transabdominal exam to visualize the endometrium, ovaries, and LEFT adnexa.  COMPARISON: 01/31/2016  FINDINGS: Uterus  Measurements: 11.7 x 5.0 x 4.9 cm. Normal morphology without mass.  Endometrium  Thickness: 24 mm, abnormally thickened. No focal abnormality or endometrial fluid  Right ovary  Not definitely visualized likely due to a combination of hydrosalpinx and bowel  Left ovary  Not definitely visualized likely due to a combination of hydrosalpinx and bowel  Other findings  Small amount of nonspecific free fluid.  Persistent dilatation of LEFT fallopian tube measuring 12.9 x 10.4 x 10.4 cm containing diffuse low level internal echoes, unchanged, favor endometriomas.  In RIGHT adnexa, dilated RIGHT fallopian tube 8.6 x 5.0 x 4.4 cm containing diffuse low level internal echoes, unchanged, favor endometriomas.  No new adnexal masses.  IMPRESSION: Dilated fallopian tubes containing diffuse low level internal echogenicity with appearance favoring endometriomas bilaterally.  Thickened endometrial complex  24 mm thick; endometrial thickness is considered abnormal. Consider follow-up by [place] in 6-8 weeks, during the week immediately following menses (exam  timing is critical).   Electronically Signed By: Ulyses Southward M.D. On: 03/13/2016 14:46 Indications: The patient is a 31 y.o. female with adnexal masses possible tubal origin. Procedure: Laparoscopy, converted to laparotomy with left salpingo-oophorectomy Pre-operative Diagnosis: Adnexal cystic masses                 Post-operative Diagnosis: Left tubo-ovarian abscess  Surgeon: Scheryl Darter MD  Assistants: Dr. Nettie Elm   Assessment    Bilateral adnexal masses and some left renal involvement, needs resection.     Consults: None  Significant Diagnostic Studies: labs:  CBC    Component Value Date/Time   WBC 11.6 (H) 05/04/2016 0525   RBC 3.92 05/04/2016 0525   HGB 10.4 (L) 05/04/2016 0525   HCT 31.9 (L) 05/04/2016 0525   PLT 154 05/04/2016 0525   MCV 81.4 05/04/2016 0525   MCH 26.5 05/04/2016 0525   MCHC 32.6 05/04/2016 0525   RDW 13.2 05/04/2016 0525   LYMPHSABS 2.1 12/25/2015 1220   MONOABS 1.0 12/25/2015 1220   EOSABS 0.2 12/25/2015 1220   BASOSABS 0.1 12/25/2015 1220   BMET    Component Value Date/Time   NA 134 (L) 05/04/2016 0525   K 4.2 05/04/2016 0525   CL 104 05/04/2016 0525   CO2 23 05/04/2016 0525   GLUCOSE 144 (H) 05/04/2016 0525   BUN 12 05/04/2016 0525   CREATININE 0.84 05/04/2016 0525   CREATININE 1.22 (H) 12/30/2015 1131   CALCIUM 8.6 (L) 05/04/2016 0525   GFRNONAA >60 05/04/2016 0525   GFRAA >60 05/04/2016 0525     Treatments: IV hydration and antibiotics: cefoxitin  Discharge Exam: Blood pressure (!) 84/41, pulse 89, temperature 99.1 F (37.3 C), temperature source Oral, resp. rate 18, last menstrual period 04/17/2016, SpO2 99 %, not currently breastfeeding. General appearance: alert, cooperative and no distress GI: soft, non-tender; bowel sounds normal; no masses,  no organomegaly  Disposition: 01-Home or Self Care   Allergies as of 05/05/2016   No Known Allergies     Medication List    STOP taking these  medications   feeding supplement (ENSURE ENLIVE) Liqd     TAKE these medications   amoxicillin-clavulanate 875-125 MG tablet Commonly known as:  AUGMENTIN Take 1 tablet by mouth 2 (two) times daily.   ferrous sulfate 325 (65 FE) MG tablet Take 325 mg by mouth daily.   lisinopril 5 MG tablet Commonly known as:  PRINIVIL,ZESTRIL Take 1 tablet (5 mg total) by mouth daily.   metoprolol succinate 25 MG 24 hr tablet Commonly known as:  TOPROL XL 1/2 TABLET DAILY What changed:  how much to take  how to take this  when to take this  additional instructions   naproxen sodium 220 MG tablet Commonly known as:  ANAPROX Take 440 mg by mouth 2 (two) times daily as needed (for pain/headaches.).   oxyCODONE-acetaminophen 5-325 MG tablet Commonly known as:  PERCOCET/ROXICET Take 1-2 tablets by mouth every 4 (four) hours as needed for moderate pain or severe pain.      Follow-up Information    Scheryl Darter, MD Follow up in 4 week(s).   Specialty:  Obstetrics and Gynecology Contact information: [address] [place] [address] [phone]           Signed: Scheryl Darter 05/05/2016, 7:20 AM

## 2016-05-05 NOTE — Progress Notes (Signed)
Patient discharged home with family. Discharge paperwork, follow-up appts, and home medications reviewed. No questions at this time.

## 2016-05-05 NOTE — Discharge Instructions (Signed)
Exploratory Laparotomy, Adult, Care After °Refer to this sheet in the next few weeks. These instructions provide you with information about caring for yourself after your procedure. Your health care provider may also give you more specific instructions. Your treatment has been planned according to current medical practices, but problems sometimes occur. Call your health care provider if you have any problems or questions after your procedure. °What can I expect after the procedure? °After your procedure, it is typical to have: °· Abdominal soreness. °· Fatigue. °· A sore throat from tubes in your throat. °· A lack of appetite. °Follow these instructions at home: °Medicines  °· Take medicines only as directed by your health care provider. °· Do not drive or operate heavy machinery while taking pain medicine. °Incision care  °· There are many different ways to close and cover an incision, including stitches (sutures), skin glue, and adhesive strips. Follow your health care provider's instructions about: °¨ Incision care. °¨ Bandage (dressing) changes and removal. °¨ Incision closure removal. °· Do not take showers or baths until your health care provider says that you can. °· Check your incision area daily for signs of infection. Watch for: °¨ Redness. °¨ Tenderness. °¨ Swelling. °¨ Drainage. °Activity  °· Do not lift anything that is heavier than 10 pounds (4.5 kg) until your health care provider says that it is safe. °· Try to walk a little bit each day if your health care provider says that it is okay. °· Ask your health care provider when you can start to do your usual activities again, such as driving, going back to work, and having sex. °Eating and drinking  °· You may eat what you usually eat. Include lots of whole grains, fruits, and vegetables in your diet. This will help to prevent constipation. °· Drink enough fluid to keep your urine clear or pale yellow. °General instructions  °· Keep all follow-up visits  as directed by your health care provider. This is important. °Contact a health care provider if: °· You have a fever. °· You have chills. °· Your pain medicine is not helping. °· You have constipation or diarrhea. °· You have nausea or vomiting. °· You have drainage, redness, swelling, or pain at your incision site. °Get help right away if: °· Your pain is getting worse. °· It has been more than 3 days since you been able to have a bowel movement. °· You have ongoing (persistent) vomiting. °· The edges of your incision open up. °· You have warmth, tenderness, and swelling in your calf. °· You have trouble breathing. °· You have chest pain. °This information is not intended to replace advice given to you by your health care provider. Make sure you discuss any questions you have with your health care provider. °Document Released: 09/13/2003 Document Revised: 07/07/2015 Document Reviewed: 09/16/2013 °Elsevier Interactive Patient Education © 2017 [employer] ° °

## 2016-05-08 ENCOUNTER — Encounter (HOSPITAL_COMMUNITY): Payer: Self-pay | Admitting: Obstetrics & Gynecology

## 2016-05-09 ENCOUNTER — Ambulatory Visit (INDEPENDENT_AMBULATORY_CARE_PROVIDER_SITE_OTHER): Payer: Self-pay | Admitting: Cardiovascular Disease

## 2016-05-09 ENCOUNTER — Ambulatory Visit (HOSPITAL_COMMUNITY): Payer: Self-pay

## 2016-05-09 ENCOUNTER — Encounter: Payer: Self-pay | Admitting: Cardiovascular Disease

## 2016-05-09 VITALS — BP 107/62 | HR 77 | Ht 64.0 in | Wt 124.4 lb

## 2016-05-09 DIAGNOSIS — I5042 Chronic combined systolic (congestive) and diastolic (congestive) heart failure: Secondary | ICD-10-CM

## 2016-05-09 MED ORDER — LISINOPRIL 5 MG PO TABS: 5.0000 mg | ORAL_TABLET | Freq: Every day | ORAL | 3 refills | Status: DC

## 2016-05-09 MED ORDER — METOPROLOL SUCCINATE ER 25 MG PO TB24: ORAL_TABLET | ORAL | 3 refills | Status: DC

## 2016-05-09 NOTE — Patient Instructions (Signed)
Medication Instructions:  Your physician recommends that you continue on your current medications as directed. Please refer to the Current Medication list given to you today.  Labwork: none  Testing/Procedures: none  Follow-Up: Your physician wants you to follow-up in: 4 month ov You will receive a reminder letter in the mail two months in advance. If you don't receive a letter, please call our office to schedule the follow-up appointment.  If you need a refill on your cardiac medications before your next appointment, please call your pharmacy.  

## 2016-05-09 NOTE — Progress Notes (Signed)
Cardiology Office Note   Date:  05/09/2016   ID:  Carolyn Lynn, DOB 12/03/1985, MRN [id number]  PCP:  Pete Glatterawn T Langeland, MD  Cardiologist:   Chilton Siiffany [place], MD   Chief Complaint  Patient presents with  . Follow-up    79month;     History of Present Illness: Carolyn Lynn is a 31 y.o. 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  Carolyn Lynn delivered a healthy baby (Tory) 11/13/15.  She presented to the ED 10/31 with shortness of breath and lower extremity edema.  Echo revealed LVEF 40-45%.  She was noted to have mild left hydronephrosis and enlarged hydrosalpinges and a possible UPJ stone.  Urine cultures were positive for Klebsiella pneumonia.  Blood cultures were 1:2 positive for Staph epi.  During that hospitalization there was also concern for pneumonia.  The hospitalization was also complicated by large bilateral hematosalpinx.  She was noted to have profound anemia with a hemoglobin of 4.9.  She required 5 units of pRBCs.  Her anemia was felt to be both due to bleeding and DIC from sepsis.  At her last appointment Carolyn Lynn was doing well. She had a repeat echo 03/20/16 that revealed LVEF 45-50% with grade 2 diastolic dysfunction. She was also noted to have bileaflet mitral valve prolapse with mild mitral regurgitation.  She was not breast feeding so she was started on lisinopril and carvedilol was switched to metoprolol.  Since then she underwent laparoscopy that was converted to a laparotomy with left salpingo-oophorectomy. She had a left tubal-ovarian abscess that could not be managed laparoscopically. Since that time she has been in pain but has otherwise been well. She hasn't noted any palpitations, chest pain, shortness of breath, lower extremity edema, orthopnea, or PND. She also denies lightheadedness or dizziness.    Past Medical History:  Diagnosis Date  . Anemia   . Chronic combined systolic and  diastolic heart failure (HCC) 03/29/2016  . Chronic kidney disease    left kidney smaller than right  . History of blood transfusion 12/2015  . Medical history non-contributory     Past Surgical History:  Procedure Laterality Date  . LAPAROSCOPY N/A 05/03/2016   Procedure: LAPAROSCOPY DIAGNOSTIC;  Surgeon: Adam PhenixJames G Arnold, MD;  Location: [facility];  Service: Gynecology;  Laterality: N/A;  . LAPAROTOMY N/A 05/03/2016   Procedure: LAPAROTOMY;  Surgeon: Adam PhenixJames G Arnold, MD;  Location: [facility];  Service: Gynecology;  Laterality: N/A;  . NO PAST SURGERIES    . SALPINGOOPHORECTOMY Left 05/03/2016   Procedure: SALPINGO OOPHORECTOMY;  Surgeon: Adam PhenixJames G Arnold, MD;  Location: [facility];  Service: Gynecology;  Laterality: Left;     Current Outpatient Prescriptions  Medication Sig Dispense Refill  . amoxicillin-clavulanate (AUGMENTIN) 875-125 MG tablet Take 1 tablet by mouth 2 (two) times daily. 20 tablet 1  . ferrous sulfate 325 (65 FE) MG tablet Take 325 mg by mouth daily.   5  . lisinopril (PRINIVIL,ZESTRIL) 5 MG tablet Take 1 tablet (5 mg total) by mouth daily. 90 tablet 3  . metoprolol succinate (TOPROL XL) 25 MG 24 hr tablet 1/2 TABLET DAILY 45 tablet 3  . naproxen sodium (ANAPROX) 220 MG tablet Take 440 mg by mouth 2 (two) times daily as needed (for pain/headaches.).    Marland Kitchen. oxyCODONE-acetaminophen (PERCOCET/ROXICET) 5-325 MG tablet Take 1-2 tablets by mouth every 4 (four) hours as needed for moderate pain or severe pain. 20 tablet 0  No current facility-administered medications for this visit.     Allergies:   Patient has no known allergies.    Social History:  The patient  reports that she has never smoked. She has never used smokeless tobacco. She reports that she does not drink alcohol or use drugs.   Family History:  The patient's Family history is unknown by patient.    ROS:  Please see the history of present illness.   Otherwise, review of systems are positive for none.   All other systems  are reviewed and negative.    PHYSICAL EXAM: VS:  BP 107/62   Pulse 77   Ht 5\' 4"  (1.626 m)   Wt 56.4 kg (124 lb 6.4 oz)   LMP 04/17/2016 (Approximate)   BMI 21.35 kg/m  , BMI Body mass index is 21.35 kg/m. GENERAL:  Well appearing.  Clearly uncomfortable.  HEENT:  Pupils equal round and reactive, fundi not visualized, oral mucosa unremarkable NECK:  No jugular venous distention, waveform within normal limits, carotid upstroke brisk and symmetric, no bruits LYMPHATICS:  No cervical adenopathy LUNGS:  Clear to auscultation bilaterally HEART:  RRR.  PMI not displaced or sustained,S1 and S2 within normal limits, no S3, no S4, no clicks, no rubs,  no murmurs ABD:  Tender and distended. Positive bowel sounds normal in frequency in pitch, no bruits, no rebound, no guarding, no midline pulsatile mass, no hepatomegaly, no splenomegaly EXT:  2 plus pulses throughout, no edema, no cyanosis no clubbing SKIN:  No rashes no nodules NEURO:  Cranial nerves II through XII grossly intact, motor grossly intact throughout PSYCH:  Cognitively intact, oriented to person place and time   EKG:  EKG is not ordered today.   Echo 12/15/15: Study Conclusions  - Left ventricle: The cavity size was mildly dilated. Systolic   function was mildly to moderately reduced. The estimated ejection   fraction was in the range of 40% to 45%. Mild diffuse hypokinesis   with no identifiable regional variations. Left ventricular   diastolic function parameters were mostly normal. Only the medial   annulus diastolic velocity was mildly reduced. - Pericardium, extracardiac: A trivial pericardial effusion was   identified.   Echo 03/20/16: LVEF 45-50%. Grade 2 diastolic dysfunction. Bileaflet mitral valve prolapse. Mild mitral regurgitation.   Recent Labs: 12/14/2015: TSH 3.534 12/15/2015: B Natriuretic Peptide 226.9 12/18/2015: Magnesium 1.9 12/26/2015: ALT 13 05/04/2016: BUN 12; Creatinine, Ser 0.84; Hemoglobin  10.4; Platelets 154; Potassium 4.2; Sodium 134    Lipid Panel No results found for: CHOL, TRIG, HDL, CHOLHDL, VLDL, LDLCALC, LDLDIRECT    Wt Readings from Last 3 Encounters:  05/09/16 56.4 kg (124 lb 6.4 oz)  04/23/16 55.8 kg (123 lb 2 oz)  04/05/16 55.8 kg (123 lb 1.6 oz)   Easy   ASSESSMENT AND PLAN:  # Chronic systolic and diastolic heart failure: # Peripartum cardiomyopathy: Carolyn Lynn is doing well from a cardiac standpoint. Unfortunately her LVEF has not changed significantly.  She understands that future pregnancies would be considered high risk. Continue metoprolol and lisinopril. Her renal function and potassium were within normal limits last week. We will not repeat a basic metabolic panel and statin.    Current medicines are reviewed at length with the patient today.  The patient does not have concerns regarding medicines.  The following changes have been made:  no change  Labs/ tests ordered today include:   No orders of the defined types were placed in this encounter.  Disposition:   FU with Jilliana Burkes C. Duke Salvia, MD, [facility] in 6 months.    This note was written with the assistance of speech recognition software.  Please excuse any transcriptional errors.  Signed, Sowmya Partridge C. Duke Salvia, MD, [facility]  05/09/2016 4:33 PM    [facility]

## 2016-05-10 ENCOUNTER — Telehealth: Payer: Self-pay | Admitting: General Practice

## 2016-05-10 DIAGNOSIS — G8918 Other acute postprocedural pain: Secondary | ICD-10-CM

## 2016-05-10 MED ORDER — TRAMADOL HCL 50 MG PO TABS: 50.0000 mg | ORAL_TABLET | Freq: Four times a day (QID) | ORAL | 0 refills | Status: DC | PRN

## 2016-05-10 NOTE — Telephone Encounter (Signed)
Patient called and left message requesting a prescription refill. Called patient back and she is requesting a refill on percocet. States the pain gets very sharp and intense at times near the incisions. Recommended she try 600-800mg  Ibuprofen every 6-8 hours around the clock to keep pain constantly managed. Patient was crying and verbalized understanding. Patient is requesting we inquire about a refill from the provider. Told patient I will check and call her back.   Called Dr Debroah LoopArnold, who authorizes Rx for tramadol 50mg  disp 40. Medication phoned in to [facility]. Called and informed patient. Patient verbalized understanding & had no questions

## 2016-06-01 ENCOUNTER — Encounter: Payer: Self-pay | Admitting: Obstetrics & Gynecology

## 2016-06-01 ENCOUNTER — Ambulatory Visit (INDEPENDENT_AMBULATORY_CARE_PROVIDER_SITE_OTHER): Payer: Self-pay | Admitting: Obstetrics & Gynecology

## 2016-06-01 VITALS — BP 128/78 | HR 64 | Wt 120.5 lb

## 2016-06-01 DIAGNOSIS — N1330 Unspecified hydronephrosis: Secondary | ICD-10-CM

## 2016-06-01 NOTE — Progress Notes (Signed)
Subjective:     Carolyn Lynn is a 31 y.o. female who presents to the clinic 4 weeks status post laparoscopy and laparotomy and left SO wiht left TOA for pelvic mass. Eating a regular diet without difficulty. Bowel movements are normal. Pain is controlled without any medications.  The following portions of the patient's history were reviewed and updated as appropriate: allergies, current medications, past family history, past medical history, past social history, past surgical history and problem list.  Review of Systems Pertinent items are noted in HPI.    Objective:    BP 128/78   Pulse 64   Wt 120 lb 8 oz (54.7 kg)   LMP 05/26/2016   Breastfeeding? No   BMI 20.68 kg/m  General:  alert, cooperative and no distress  Abdomen: soft, non-tender  Incision:   healing well, no drainage, no erythema, no hernia, no seroma, no swelling, no dehiscence, incision well approximated     Assessment:   Patient Active Problem List   Diagnosis Date Noted  . Left tubo-ovarian abscess 05/03/2016  . Chronic combined systolic and diastolic heart failure (HCC) 03/29/2016  . Hydronephrosis, left   . Peripartum cardiomyopathy   . Hepatosplenomegaly 12/14/2015  . Anemia, iron deficiency 12/14/2015     Doing well postoperatively. Operative findings again reviewed. Pathology report discussed.    Plan:    1. Continue any current medications. 2. Wound care discussed. 3. Activity restrictions: none 4. Anticipated return to work: now. 5. Follow up: prn 6. BMP  Adam Phenix, MD 06/01/2016

## 2016-06-01 NOTE — Patient Instructions (Signed)
Exploratory Laparotomy, Adult, Care After °Refer to this sheet in the next few weeks. These instructions provide you with information about caring for yourself after your procedure. Your health care provider may also give you more specific instructions. Your treatment has been planned according to current medical practices, but problems sometimes occur. Call your health care provider if you have any problems or questions after your procedure. °What can I expect after the procedure? °After your procedure, it is typical to have: °· Abdominal soreness. °· Fatigue. °· A sore throat from tubes in your throat. °· A lack of appetite. °Follow these instructions at home: °Medicines  °· Take medicines only as directed by your health care provider. °· Do not drive or operate heavy machinery while taking pain medicine. °Incision care  °· There are many different ways to close and cover an incision, including stitches (sutures), skin glue, and adhesive strips. Follow your health care provider's instructions about: °¨ Incision care. °¨ Bandage (dressing) changes and removal. °¨ Incision closure removal. °· Do not take showers or baths until your health care provider says that you can. °· Check your incision area daily for signs of infection. Watch for: °¨ Redness. °¨ Tenderness. °¨ Swelling. °¨ Drainage. °Activity  °· Do not lift anything that is heavier than 10 pounds (4.5 kg) until your health care provider says that it is safe. °· Try to walk a little bit each day if your health care provider says that it is okay. °· Ask your health care provider when you can start to do your usual activities again, such as driving, going back to work, and having sex. °Eating and drinking  °· You may eat what you usually eat. Include lots of whole grains, fruits, and vegetables in your diet. This will help to prevent constipation. °· Drink enough fluid to keep your urine clear or pale yellow. °General instructions  °· Keep all follow-up visits  as directed by your health care provider. This is important. °Contact a health care provider if: °· You have a fever. °· You have chills. °· Your pain medicine is not helping. °· You have constipation or diarrhea. °· You have nausea or vomiting. °· You have drainage, redness, swelling, or pain at your incision site. °Get help right away if: °· Your pain is getting worse. °· It has been more than 3 days since you been able to have a bowel movement. °· You have ongoing (persistent) vomiting. °· The edges of your incision open up. °· You have warmth, tenderness, and swelling in your calf. °· You have trouble breathing. °· You have chest pain. °This information is not intended to replace advice given to you by your health care provider. Make sure you discuss any questions you have with your health care provider. °Document Released: 09/13/2003 Document Revised: 07/07/2015 Document Reviewed: 09/16/2013 °Elsevier Interactive Patient Education © 2017 [employer] ° °

## 2016-06-02 LAB — BASIC METABOLIC PANEL
BUN/Creatinine Ratio: 13 (ref 9–23)
BUN: 12 mg/dL (ref 6–20)
CALCIUM: 9.2 mg/dL (ref 8.7–10.2)
CHLORIDE: 103 mmol/L (ref 96–106)
CO2: 26 mmol/L (ref 18–29)
Creatinine, Ser: 0.95 mg/dL (ref 0.57–1.00)
GFR calc non Af Amer: 81 mL/min/{1.73_m2} (ref >59)
GFR, EST AFRICAN AMERICAN: 93 mL/min/{1.73_m2} (ref >59)
Glucose: 82 mg/dL (ref 65–99)
POTASSIUM: 4.3 mmol/L (ref 3.5–5.2)
SODIUM: 143 mmol/L (ref 134–144)

## 2016-06-07 ENCOUNTER — Encounter: Payer: Self-pay | Admitting: Obstetrics & Gynecology

## 2016-06-17 ENCOUNTER — Encounter: Payer: Self-pay | Admitting: Obstetrics & Gynecology

## 2016-06-21 ENCOUNTER — Other Ambulatory Visit: Payer: Self-pay | Admitting: Obstetrics & Gynecology

## 2016-06-21 DIAGNOSIS — Z30011 Encounter for initial prescription of contraceptive pills: Secondary | ICD-10-CM

## 2016-06-21 MED ORDER — NORGESTIM-ETH ESTRAD TRIPHASIC 0.18/0.215/0.25 MG-25 MCG PO TABS: 1.0000 | ORAL_TABLET | Freq: Every day | ORAL | 11 refills | Status: DC

## 2016-06-28 ENCOUNTER — Encounter: Payer: Self-pay | Admitting: Internal Medicine

## 2016-07-13 NOTE — Addendum Note (Signed)
Addendum  created 07/13/16 1117 by Abir Eroh D, MD   Sign clinical note    

## 2016-10-29 ENCOUNTER — Encounter: Payer: Self-pay | Admitting: Cardiovascular Disease

## 2016-10-29 ENCOUNTER — Ambulatory Visit (INDEPENDENT_AMBULATORY_CARE_PROVIDER_SITE_OTHER): Payer: Self-pay | Admitting: Cardiovascular Disease

## 2016-10-29 VITALS — BP 120/78 | HR 57 | Ht 64.0 in | Wt 124.8 lb

## 2016-10-29 DIAGNOSIS — O903 Peripartum cardiomyopathy: Secondary | ICD-10-CM

## 2016-10-29 DIAGNOSIS — I5042 Chronic combined systolic (congestive) and diastolic (congestive) heart failure: Secondary | ICD-10-CM

## 2016-10-29 NOTE — Progress Notes (Signed)
Cardiology Office Note   Date:  10/29/2016   ID:  Carolyn Lynn, DOB 08-Jan-1986, MRN [id number]  PCP:  Pete Glatter, MD  Cardiologist:   Chilton Si, MD   No chief complaint on file.    History of Present Illness: Carolyn Lynn is a 31 y.o. 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  Carolyn Lynn delivered a healthy baby (Tory) 11/13/15.  She presented to the ED 10/31 with shortness of breath and lower extremity edema.  Echo revealed LVEF 40-45%.  She was noted to have mild left hydronephrosis and enlarged hydrosalpinges and a possible UPJ stone.  Urine cultures were positive for Klebsiella pneumonia.  During that hospitalization there was also concern for pneumonia.  The hospitalization was also complicated by large bilateral hematosalpinx.  She was noted to have profound anemia with a hemoglobin of 4.9.  She required 5 units of pRBCs.  Her anemia was felt to be both due to bleeding and DIC from sepsis. She had a follow up echo 03/20/16 that revealed LVEF 45-50% with grade 2 diastolic dysfunction. She was also noted to have bileaflet mitral valve prolapse with mild mitral regurgitation.    Since her last appointment Carolyn Lynn has been doing well.  She denies chest pain, shortness of breath, lower extremity edema, orthopnea or PND.  She has been very active caring for her almost 57-year-old son. She has no difficulty chasing him around. She has no desire for future pregnancies.  She underwent laparoscopy that was converted to a laparotomy and left salpingo-oophorectomy on 05/03/16. She has recovered from this well and denies abdominal pain.    Past Medical History:  Diagnosis Date  . Anemia   . Chronic combined systolic and diastolic heart failure (HCC) 03/29/2016  . Chronic kidney disease    left kidney smaller than right  . History of blood transfusion 12/2015  . Medical history non-contributory      Past Surgical History:  Procedure Laterality Date  . LAPAROSCOPY N/A 05/03/2016   Procedure: LAPAROSCOPY DIAGNOSTIC;  Surgeon: Adam Phenix, MD;  Location: [facility];  Service: Gynecology;  Laterality: N/A;  . LAPAROTOMY N/A 05/03/2016   Procedure: LAPAROTOMY;  Surgeon: Adam Phenix, MD;  Location: [facility];  Service: Gynecology;  Laterality: N/A;  . NO PAST SURGERIES    . SALPINGOOPHORECTOMY Left 05/03/2016   Procedure: SALPINGO OOPHORECTOMY;  Surgeon: Adam Phenix, MD;  Location: [facility];  Service: Gynecology;  Laterality: Left;     Current Outpatient Prescriptions  Medication Sig Dispense Refill  . metoprolol succinate (TOPROL XL) 25 MG 24 hr tablet 1/2 TABLET DAILY 45 tablet 3  . lisinopril (PRINIVIL,ZESTRIL) 5 MG tablet Take 1 tablet (5 mg total) by mouth daily. 90 tablet 3   No current facility-administered medications for this visit.     Allergies:   Patient has no known allergies.    Social History:  The patient  reports that she has never smoked. She has never used smokeless tobacco. She reports that she does not drink alcohol or use drugs.   Family History:  The patient's Family history is unknown by patient.    ROS:  Please see the history of present illness.   Otherwise, review of systems are positive for none.   All other systems are reviewed and negative.    PHYSICAL EXAM: VS:  BP 120/78   Pulse (!) 57   Ht  (1.626 m)  Wt 56.6 kg (124 lb 12.8 oz)   BMI 21.42 kg/m  , BMI Body mass index is 21.42 kg/m. GENERAL:  Well appearing HEENT: Pupils equal round and reactive, fundi not visualized, oral mucosa unremarkable NECK:  No jugular venous distention, waveform within normal limits, carotid upstroke brisk and symmetric, no bruits, no thyromegaly LYMPHATICS:  No cervical adenopathy LUNGS:  Clear to auscultation bilaterally HEART:  RRR.  PMI not displaced or sustained,S1 and S2 within normal limits, no S3, no S4, no clicks, no rubs, no murmurs ABD:  Flat,  positive bowel sounds normal in frequency in pitch, no bruits, no rebound, no guarding, no midline pulsatile mass, no hepatomegaly, no splenomegaly EXT:  2 plus pulses throughout, no edema, no cyanosis no clubbing SKIN:  No rashes no nodules NEURO:  Cranial nerves II through XII grossly intact, motor grossly intact throughout PSYCH:  Cognitively intact, oriented to person place and time    EKG:  EKG is ordered today. 10/29/16: Sinus bradycardia. Rate 57 bpm.  Echo 12/15/15: Study Conclusions  - Left ventricle: The cavity size was mildly dilated. Systolic   function was mildly to moderately reduced. The estimated ejection   fraction was in the range of 40% to 45%. Mild diffuse hypokinesis   with no identifiable regional variations. Left ventricular   diastolic function parameters were mostly normal. Only the medial   annulus diastolic velocity was mildly reduced. - Pericardium, extracardiac: A trivial pericardial effusion was   identified.   Echo 03/20/16: LVEF 45-50%. Grade 2 diastolic dysfunction. Bileaflet mitral valve prolapse. Mild mitral regurgitation.   Recent Labs: 12/14/2015: TSH 3.534 12/15/2015: B Natriuretic Peptide 226.9 12/18/2015: Magnesium 1.9 12/26/2015: ALT 13 05/04/2016: Hemoglobin 10.4; Platelets 154 06/01/2016: BUN 12; Creatinine, Ser 0.95; Potassium 4.3; Sodium 143    Lipid Panel No results found for: CHOL, TRIG, HDL, CHOLHDL, VLDL, LDLCALC, LDLDIRECT    Wt Readings from Last 3 Encounters:  10/29/16 56.6 kg (124 lb 12.8 oz)  06/01/16 54.7 kg (120 lb 8 oz)  05/09/16 56.4 kg (124 lb 6.4 oz)   Easy   ASSESSMENT AND PLAN:  # Chronic systolic and diastolic heart failure: # Peripartum cardiomyopathy: Carolyn Lynn is Euvolemic and doing well. She has no heart failure symptoms. Her LVEF is mildly reduced. We discussed the fact that any future pregnancies would be considered high risk and would not be advised. She has no desire to have any future pregnancies.  Continue metoprolol and lisinopril.    Current medicines are reviewed at length with the patient today.  The patient does not have concerns regarding medicines.  The following changes have been made:  no change  Labs/ tests ordered today include:   No orders of the defined types were placed in this encounter.    Disposition:   FU with Carolyn Parish C. Duke Salvia, MD, [facility] in 1 year   This note was written with the assistance of speech recognition software.  Please excuse any transcriptional errors.  Signed, Abaigeal Moomaw C. Duke Salvia, MD, [facility]  10/29/2016 8:21 AM    [facility]

## 2016-10-29 NOTE — Patient Instructions (Signed)

## 2017-09-02 ENCOUNTER — Encounter: Payer: Self-pay | Admitting: Cardiovascular Disease

## 2017-10-29 ENCOUNTER — Ambulatory Visit (INDEPENDENT_AMBULATORY_CARE_PROVIDER_SITE_OTHER): Payer: Self-pay | Admitting: Cardiovascular Disease

## 2017-10-29 ENCOUNTER — Encounter: Payer: Self-pay | Admitting: Cardiovascular Disease

## 2017-10-29 VITALS — BP 112/84 | HR 63 | Ht 64.0 in | Wt 154.6 lb

## 2017-10-29 DIAGNOSIS — I5042 Chronic combined systolic (congestive) and diastolic (congestive) heart failure: Secondary | ICD-10-CM

## 2017-10-29 NOTE — Addendum Note (Signed)
Addended by: Regis BillPRATT, Kastiel Simonian B on: 10/29/2017 01:10 PM   Modules accepted: Orders

## 2017-10-29 NOTE — Patient Instructions (Signed)
Medication Instructions:  Your physician recommends that you continue on your current medications as directed. Please refer to the Current Medication list given to you today.  Labwork: NONE  Testing/Procedures: NONE  Follow-Up: Your physician wants you to follow-up in: 1 YEAR  You will receive a reminder letter in the mail two months in advance. If you don't receive a letter, please call our office to schedule the follow-up appointment.  If you need a refill on your cardiac medications before your next appointment, please call your pharmacy.  

## 2017-10-29 NOTE — Progress Notes (Signed)
Cardiology Office Note   Date:  10/29/2017   ID:  Carolyn Lynn, DOB August 09, 1985, MRN [id number]  PCP:  Pete Glatter, MD (Inactive)  Cardiologist:   Chilton Si, MD   No chief complaint on file.    History of Present Illness: Carolyn Lynn is a 32 y.o. 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  Carolyn Lynn delivered a healthy baby (Carolyn Lynn) 11/13/15.  She presented to the ED 10/31 with shortness of breath and lower extremity edema.  Echo revealed LVEF 40-45%.  She was noted to have mild left hydronephrosis and enlarged hydrosalpinges and a possible UPJ stone.  Urine cultures were positive for Klebsiella pneumonia.  During that hospitalization there was also concern for pneumonia.  The hospitalization was also complicated by large bilateral hematosalpinx.  She was noted to have profound anemia with a hemoglobin of 4.9.  She required 5 units of pRBCs.  Her anemia was felt to be both due to bleeding and DIC from sepsis. She had a follow up echo 03/20/16 that revealed LVEF 45-50% with grade 2 diastolic dysfunction. She was also noted to have bileaflet mitral valve prolapse with mild mitral regurgitation.    Carolyn Lynn underwent laparoscopy that was converted to a laparotomy and left salpingo-oophorectomy on 05/03/16. She has no desire to have more children.  Since her last appointment she has been feeling well.  She has not been getting much exercise lately.  She attributes this to being "lazy."  She has no exertional symptoms and has not had any lower extremity edema, orthopnea, or PND.  She notes that her diet has been terrible.  She does not have much time for cooking.  She is a single mother and on her one off day she does not feel like cooking.  She usually eats a proximally once per day and then snacks in between.  She has gained 30 pounds in the last year.  Past Medical History:  Diagnosis Date  . Anemia   .  Chronic combined systolic and diastolic heart failure (HCC) 03/29/2016  . Chronic kidney disease    left kidney smaller than right  . History of blood transfusion 12/2015  . Medical history non-contributory     Past Surgical History:  Procedure Laterality Date  . LAPAROSCOPY N/A 05/03/2016   Procedure: LAPAROSCOPY DIAGNOSTIC;  Surgeon: Adam Phenix, MD;  Location: [facility];  Service: Gynecology;  Laterality: N/A;  . LAPAROTOMY N/A 05/03/2016   Procedure: LAPAROTOMY;  Surgeon: Adam Phenix, MD;  Location: [facility];  Service: Gynecology;  Laterality: N/A;  . NO PAST SURGERIES    . SALPINGOOPHORECTOMY Left 05/03/2016   Procedure: SALPINGO OOPHORECTOMY;  Surgeon: Adam Phenix, MD;  Location: [facility];  Service: Gynecology;  Laterality: Left;     Current Outpatient Medications  Medication Sig Dispense Refill  . metoprolol succinate (TOPROL XL) 25 MG 24 hr tablet 1/2 TABLET DAILY 45 tablet 3  . lisinopril (PRINIVIL,ZESTRIL) 5 MG tablet Take 1 tablet (5 mg total) by mouth daily. 90 tablet 3   No current facility-administered medications for this visit.     Allergies:   Patient has no known allergies.    Social History:  The patient  reports that she has never smoked. She has never used smokeless tobacco. She reports that she does not drink alcohol or use drugs.   Family History:  The patient's Family history is unknown by patient.  ROS:  Please see the history of present illness.   Otherwise, review of systems are positive for none.   All other systems are reviewed and negative.    PHYSICAL EXAM: VS:  BP 112/84   Pulse 63   Ht 5\' 4"  (1.626 m)   Wt 154 lb 9.6 oz (70.1 kg)   BMI 26.54 kg/m  , BMI Body mass index is 26.54 kg/m. GENERAL:  Well appearing HEENT: Pupils equal round and reactive, fundi not visualized, oral mucosa unremarkable NECK:  No jugular venous distention, waveform within normal limits, carotid upstroke brisk and symmetric, no bruits LUNGS:  Clear to  auscultation bilaterally HEART:  RRR.  PMI not displaced or sustained,S1 and S2 within normal limits, no S3, no S4, no clicks, no rubs, no murmurs ABD:  Flat, positive bowel sounds normal in frequency in pitch, no bruits, no rebound, no guarding, no midline pulsatile mass, no hepatomegaly, no splenomegaly EXT:  2 plus pulses throughout, no edema, no cyanosis no clubbing SKIN:  No rashes no nodules NEURO:  Cranial nerves II through XII grossly intact, motor grossly intact throughout PSYCH:  Cognitively intact, oriented to person place and time   EKG:  EKG is ordered today. 10/29/16: Sinus bradycardia. Rate 57 bpm. 10/29/17: Sinus rhythm.  Rate 63 bpm.    Echo 12/15/15: Study Conclusions  - Left ventricle: The cavity size was mildly dilated. Systolic   function was mildly to moderately reduced. The estimated ejection   fraction was in the range of 40% to 45%. Mild diffuse hypokinesis   with no identifiable regional variations. Left ventricular   diastolic function parameters were mostly normal. Only the medial   annulus diastolic velocity was mildly reduced. - Pericardium, extracardiac: A trivial pericardial effusion was   identified.   Echo 03/20/16: LVEF 45-50%. Grade 2 diastolic dysfunction. Bileaflet mitral valve prolapse. Mild mitral regurgitation.   Recent Labs: No results found for requested labs within last 8760 hours.    Lipid Panel No results found for: CHOL, TRIG, HDL, CHOLHDL, VLDL, LDLCALC, LDLDIRECT    Wt Readings from Last 3 Encounters:  10/29/17 154 lb 9.6 oz (70.1 kg)  10/29/16 124 lb 12.8 oz (56.6 kg)  06/01/16 120 lb 8 oz (54.7 kg)   Easy   ASSESSMENT AND PLAN:  # Chronic systolic and diastolic heart failure: # Peripartum cardiomyopathy: LVEF 45-50%.  Clinically stable and euvolemic.  Continue metoprolol and lisinopril. We discussed the fact that any future pregnancies would be considered high risk and would not be advised. She has no desire to have any  future pregnancies.  Check BMP at follow up.  # Weight gain:  Carolyn Lynn gained 30lb in the last year.  She is not exercising and her diet is poor.  We discussed the importance of increasing her exercise routine to 150 minutes/week and finding time for herself.  We also strategized ways to improve her diet including cooking more and meal prepping.  She expressed understanding.    Current medicines are reviewed at length with the patient today.  The patient does not have concerns regarding medicines.  The following changes have been made:  no change  Labs/ tests ordered today include:   No orders of the defined types were placed in this encounter.    Disposition:   FU with Carolyn Moeckel C. Duke Salviaandolph, MD, [facility] in 1 year     Signed, Sigismund Cross C. Duke Salviaandolph, MD, [facility]  10/29/2017 9:56 AM    [facility]

## 2018-02-23 IMAGING — CT CT ABD-PELV W/O CM
2 of 4 series · 14 of 46 positions shown, 16 images · non-contrast
Comparison: None available.

CLINICAL DATA: Initial evaluation for acute abdominal pain. Status
post recent vaginal delivery.

EXAM:
CT ABDOMEN AND PELVIS WITHOUT CONTRAST
TECHNIQUE: Multidetector CT imaging of the abdomen and pelvis was performed
following the standard protocol without IV contrast.

[Series 2: abd/ pelvis 5.0 i30f 1 · axial · 0.76mm/px · z∈[+972,+1322]mm · 11 of 84 slices shown, 13 images]
[im 7/84  soft-tissue]
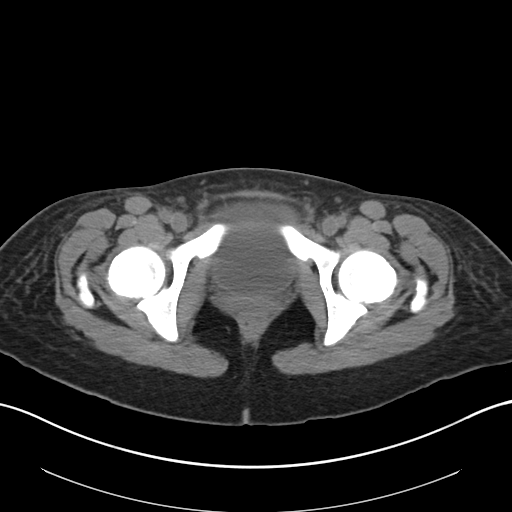
[im 7/84  bone]
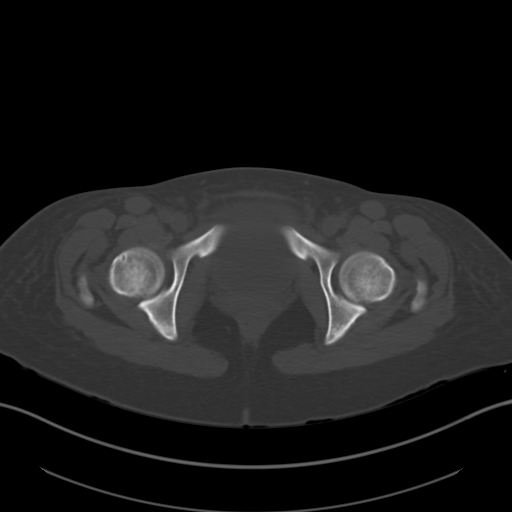
[im 14/84  soft-tissue]
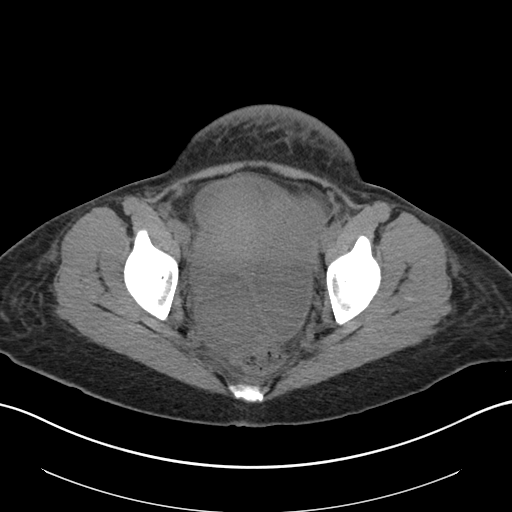
[im 20/84  soft-tissue]
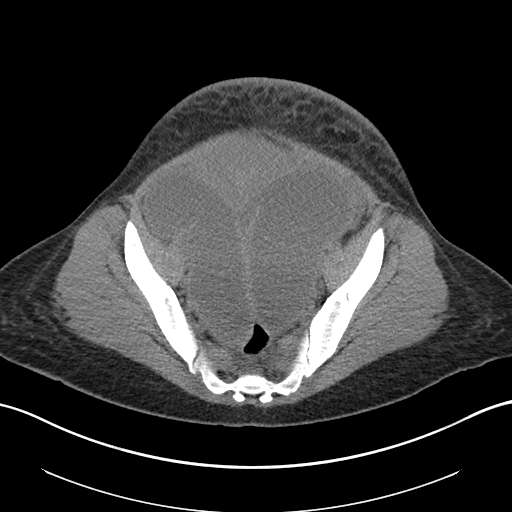
[im 27/84  soft-tissue]
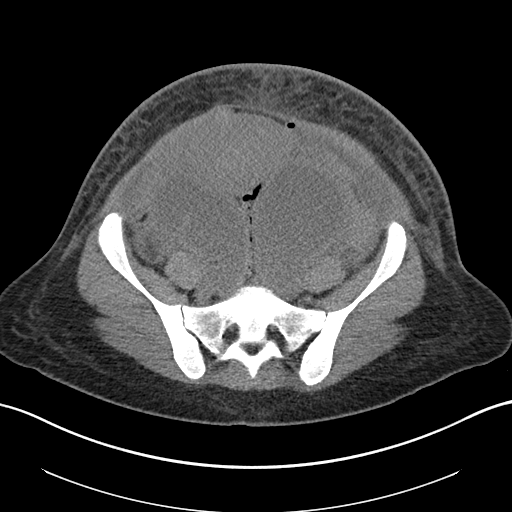
[im 34/84  soft-tissue]
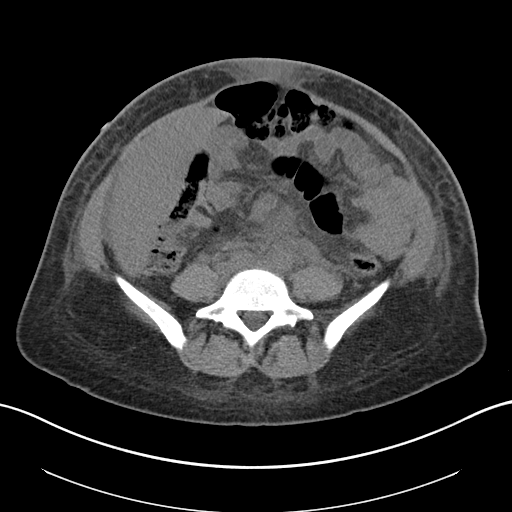
[im 44/84  soft-tissue]
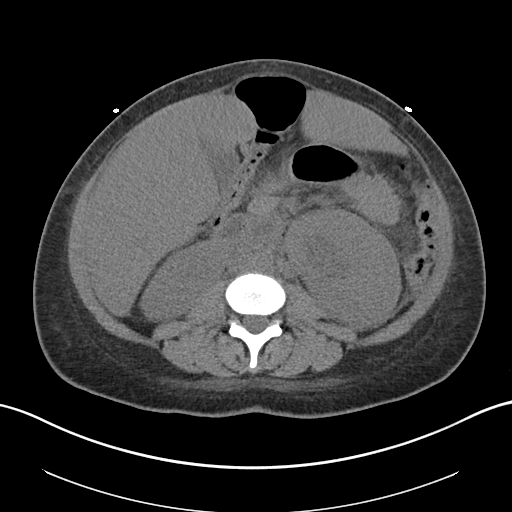
[im 50/84  soft-tissue]
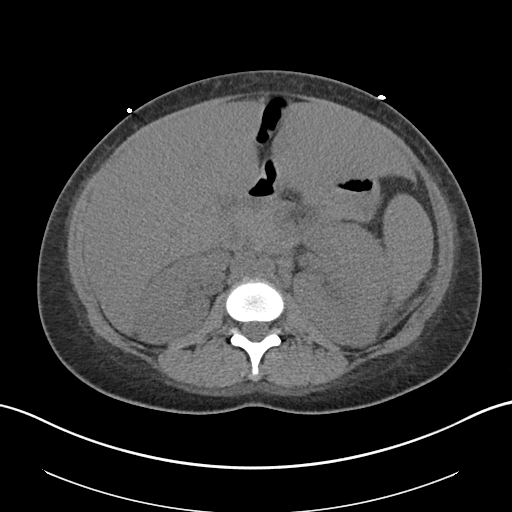
[im 57/84  soft-tissue]
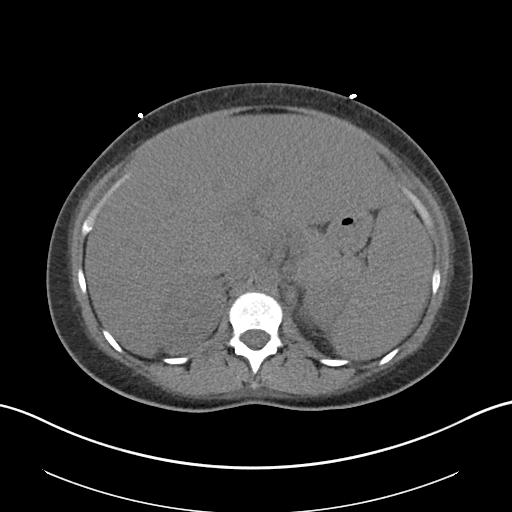
[im 64/84  soft-tissue]
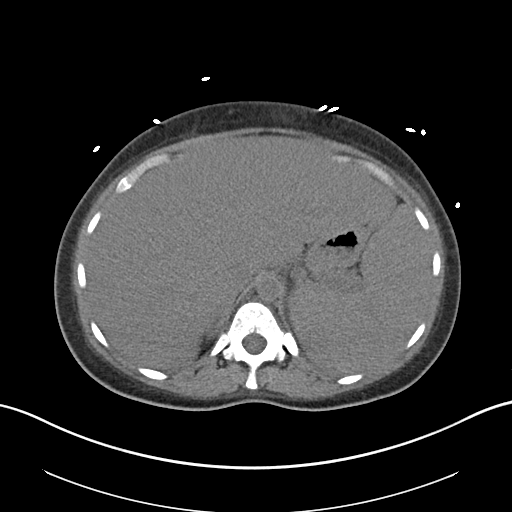
[im 64/84  bone]
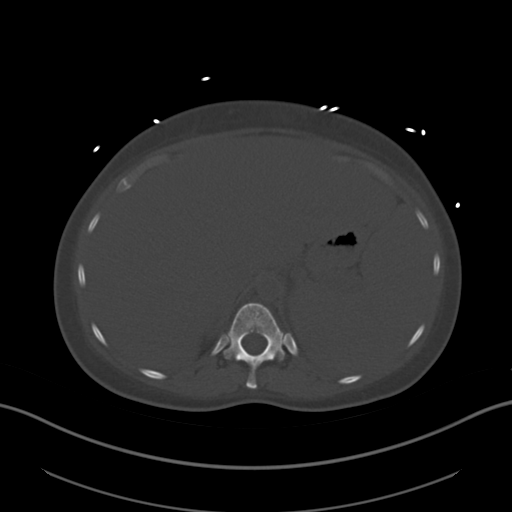
[im 70/84  soft-tissue]
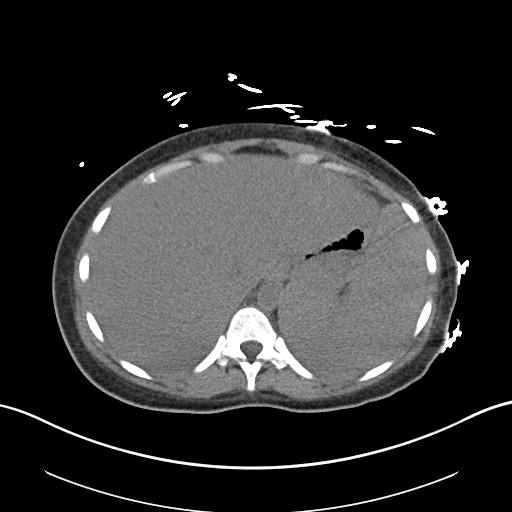
[im 77/84  soft-tissue]
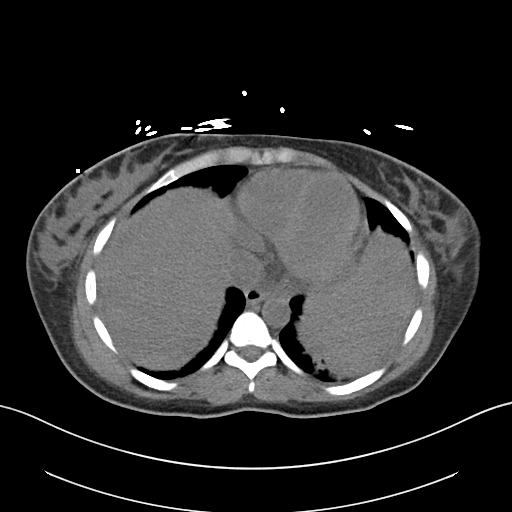

[Series 5: cor st · coronal · 0.69mm/px · 3 of 87 slices shown]
[im 29/87  soft-tissue]
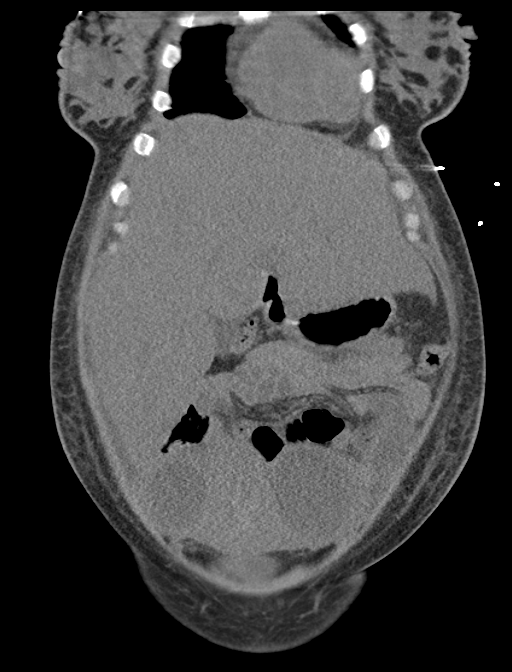
[im 39/87  soft-tissue]
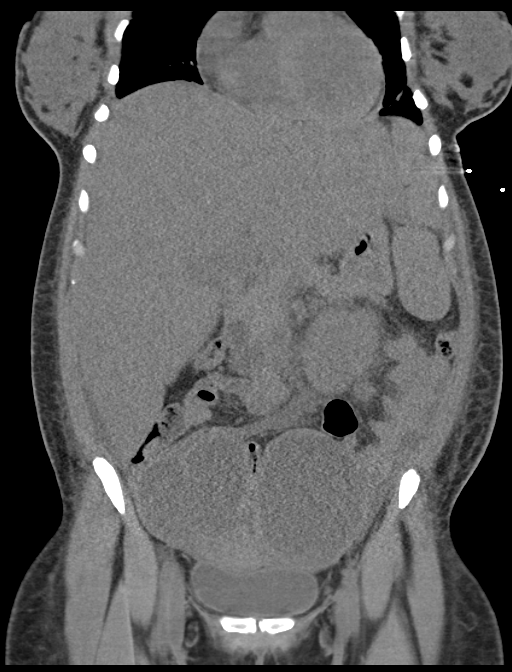
[im 48/87  soft-tissue]
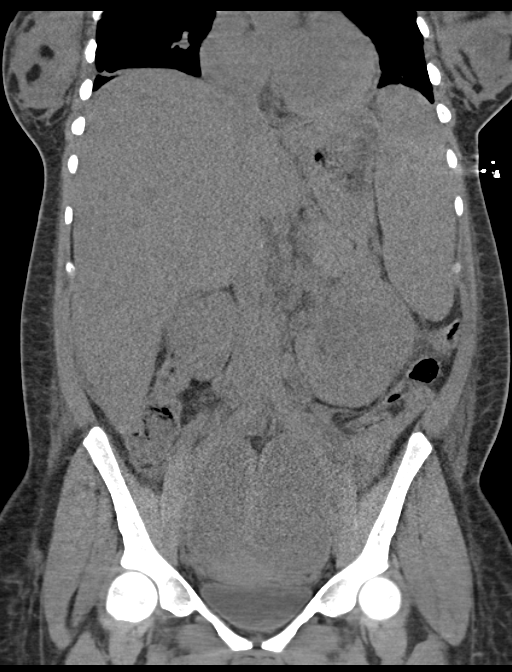

[14 of 46 positions shown; findings below may reference images not displayed]

FINDINGS: Lower chest: Mild scattered atelectatic changes present within the
visualized lung bases. Visualized lung bases are otherwise clear.
Diffusely decreased density within the cardiac blood pool suggestive
of anemia. Changes related to pregnancy noted within the bilateral
breasts.

Hepatobiliary: 16 mm hypodensity within the posterior right hepatic
lobe present, most consistent with a cyst. Liver itself is somewhat
enlarged. Limited noncontrast evaluation of the liver is otherwise
unremarkable. Gallbladder within normal limits. No biliary
dilatation.

Pancreas: Noncontrast evaluation the pancreas is unremarkable.

Spleen: Spleen is enlarged measuring 16.2 cm in craniocaudad
dimension.

Adrenals/Urinary Tract: Adrenal glands are grossly unremarkable. The
left kidney is somewhat prominent as compared to the right with
question of mild hydronephrosis. This may be related to the ongoing
pelvic process. No nephrolithiasis bilaterally. Right kidney within
normal limits. No definite hydroureter. Partially distended bladder
unremarkable.

Stomach/Bowel: Stomach within normal limits. No evidence for bowel
obstruction. No acute inflammatory changes seen about the bowels.

Vascular/Lymphatic: Limited evaluation of the vascular structures on
this noncontrast examination. Question enlarged periaortic lymph
nodes measuring up to 15 mm (series 2, image 34). No other definite
adenopathy on this noncontrast examination.

Reproductive: Possible small fibroid arising from the uterine fundus
noted (series 2, image 58). There are bilateral somewhat tubular
cystic lesions within both adnexa, measuring 14.2 x 5.7 cm on the
right and 12.6 x 6.7 cm on the left. These lesions measure low
height and intermediate fluid density (approximately 20-25
Hounsfield units). Ovaries themselves not well delineated. Findings
are suspected to reflect hydrosalpinges. Ovarian malignancy felt
unlikely given the bilateral symmetric nature of this finding and
patient age.

Other: No free intraperitoneal air. Small volume free fluid within
the abdomen and pelvis.

Musculoskeletal: Mild diffuse anasarca noted. No acute osseous
abnormality. No worrisome lytic or blastic osseous lesions.
IMPRESSION: 1. Enlarged tubular cystic structures involving the bilateral adnexa
as above, suspected to reflect enlarged hydrosalpinges. These
structures demonstrate low to intermediate fluid density, raising
the possibility that some component of proteinaceous material (as
may be expected with infection or possibly blood) may be present.
Further evaluation with dedicated pelvic ultrasound may be helpful
to further evaluate this finding.
2. Mild prominence of the left kidney with suspected mild left
hydronephrosis. Finding may be secondary to the enlarged
hydrosalpinges. Superimposed infection could also be considered.
Correlation with urinalysis recommended.
3. Hepatosplenomegaly.
4. Small volume free fluid within the abdomen and pelvis with mild
diffuse anasarca, suspected to be related to underlying intrinsic
liver disease.
5. Enlarged periaortic adenopathy as above, not well delineated on
this noncontrast examination.
6. Decreased density within the cardiac blood pool, suggesting
anemia.

## 2018-12-23 ENCOUNTER — Telehealth (INDEPENDENT_AMBULATORY_CARE_PROVIDER_SITE_OTHER): Payer: Medicaid Other | Admitting: Cardiovascular Disease

## 2018-12-23 ENCOUNTER — Telehealth: Payer: Self-pay | Admitting: Licensed Clinical Social Worker

## 2018-12-23 ENCOUNTER — Encounter: Payer: Self-pay | Admitting: Cardiovascular Disease

## 2018-12-23 VITALS — Ht 64.0 in | Wt 158.0 lb

## 2018-12-23 DIAGNOSIS — O903 Peripartum cardiomyopathy: Secondary | ICD-10-CM

## 2018-12-23 DIAGNOSIS — I5042 Chronic combined systolic (congestive) and diastolic (congestive) heart failure: Secondary | ICD-10-CM

## 2018-12-23 NOTE — Telephone Encounter (Signed)
CSW referred to assist patient with obtaining a BP cuff. CSW contacted patient to inform cuff will be delivered to home. Message left. CSW available as needed. Jackie Shariah Assad, LCSW, CCSW-MCS [phone]  

## 2018-12-23 NOTE — Progress Notes (Signed)
Cardiology Office Note   Date:  12/23/2018   ID:  LOKELANI LUTES, DOB 03/24/1985, MRN [id number]  PCP:  Maren Reamer, MD (Inactive)  Cardiologist:   Skeet Latch, MD   No chief complaint on file.    History of Present Illness: KIERSTEN COSS is a 33 y.o. 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  Ms. Zettler delivered a healthy baby (Tory) 11/13/15.  She presented to the ED 10/31 with shortness of breath and lower extremity edema.  Echo revealed LVEF 40-45%.  She was noted to have mild left hydronephrosis and enlarged hydrosalpinges and a possible UPJ stone.  Urine cultures were positive for Klebsiella pneumonia.  During that hospitalization there was also concern for pneumonia.  The hospitalization was also complicated by large bilateral hematosalpinx.  She was noted to have profound anemia with a hemoglobin of 4.9.  She required 5 units of pRBCs.  Her anemia was felt to be both due to bleeding and DIC from sepsis. She had a follow up echo 03/20/16 that revealed LVEF 45-50% with grade 2 diastolic dysfunction. She was also noted to have bileaflet mitral valve prolapse with mild mitral regurgitation.    Ms. Pappalardo underwent laparoscopy that was converted to a laparotomy and left salpingo-oophorectomy on 05/03/16. Lately she has been feeling poorly.  Her father passed away two months ago.  She found out that he had a heart condition.  He had a heart attack in his sleep.  The last week she has also had sinus congestion and cough at night. She has no loss of taste or smell.  She denies fever or chills.  She also she is not getting much exercise.  She does work in [employer] (medical) but does not have any known contact with coronavirus.  She stopped taking her metoprolol and lisinopril many months she does not really know why.  She is considering having another child.  Past Medical History:  Diagnosis Date  . Anemia   . Chronic  combined systolic and diastolic heart failure ([place]) 03/29/2016  . Chronic kidney disease    left kidney smaller than right  . History of blood transfusion 12/2015  . Medical history non-contributory     Past Surgical History:  Procedure Laterality Date  . LAPAROSCOPY N/A 05/03/2016   Procedure: LAPAROSCOPY DIAGNOSTIC;  Surgeon: Woodroe Mode, MD;  Location: [facility];  Service: Gynecology;  Laterality: N/A;  . LAPAROTOMY N/A 05/03/2016   Procedure: LAPAROTOMY;  Surgeon: Woodroe Mode, MD;  Location: [facility];  Service: Gynecology;  Laterality: N/A;  . NO PAST SURGERIES    . SALPINGOOPHORECTOMY Left 05/03/2016   Procedure: SALPINGO OOPHORECTOMY;  Surgeon: Woodroe Mode, MD;  Location: [facility];  Service: Gynecology;  Laterality: Left;     No current outpatient medications on file.   No current facility-administered medications for this visit.     Allergies:   Patient has no known allergies.    Social History:  The patient  reports that she has never smoked. She has never used smokeless tobacco. She reports that she does not drink alcohol or use drugs.   Family History:  The patient's Family history is unknown by patient.    ROS:  Please see the history of present illness.   Otherwise, review of systems are positive for none.   All other systems are reviewed and negative.    PHYSICAL EXAM: VS:  Ht 5\' 4"  (1.626 m)  Wt 158 lb (71.7 kg)   BMI 27.12 kg/m  , BMI Body mass index is 27.12 kg/m. GENERAL:  Sounds well. LUNGS:  Respirations unlabored NEURO: Speech fluent PSYCH:  Cognitively intact, oriented to person place and time  EKG:  EKG is not ordered today. 10/29/16: Sinus bradycardia. Rate 57 bpm. 10/29/17: Sinus rhythm.  Rate 63 bpm.    Echo 12/15/15: Study Conclusions  - Left ventricle: The cavity size was mildly dilated. Systolic   function was mildly to moderately reduced. The estimated ejection   fraction was in the range of 40% to 45%. Mild diffuse hypokinesis   with  no identifiable regional variations. Left ventricular   diastolic function parameters were mostly normal. Only the medial   annulus diastolic velocity was mildly reduced. - Pericardium, extracardiac: A trivial pericardial effusion was   identified.   Echo 03/20/16: LVEF 45-50%. Grade 2 diastolic dysfunction. Bileaflet mitral valve prolapse. Mild mitral regurgitation.   Recent Labs: No results found for requested labs within last 8760 hours.    Lipid Panel No results found for: CHOL, TRIG, HDL, CHOLHDL, VLDL, LDLCALC, LDLDIRECT    Wt Readings from Last 3 Encounters:  12/23/18 158 lb (71.7 kg)  10/29/17 154 lb 9.6 oz (70.1 kg)  10/29/16 124 lb 12.8 oz (56.6 kg)     ASSESSMENT AND PLAN:  # Chronic systolic and diastolic heart failure: # Peripartum cardiomyopathy: LVEF 45-50%.  Clinically stable and euvolemic.  She self discontinued metoprolol and lisinopril.  She remains stable, though she is unable  We discussed the fact that given that her systolic function did not return to normal, any future pregnancies would be considered high risk and would not be advised.  If she changes her mind about pregnancy she will let us know right away.    Current medicines are reviewed at length with the patient today.  The patient does not have concerns regarding medicines.  The following changes have been made:  no change  Labs/ tests ordered today include:   No orders of the defined types were placed in this encounter.    Disposition:   FU with Nazar Kuan C. Duke Salvia, MD, [facility] in 1 year.  APP in 3 months     Signed, Adonna Horsley C. Duke Salvia, MD, [facility]  12/23/2018 4:00 PM    [facility]

## 2018-12-23 NOTE — Patient Instructions (Addendum)
Medication Instructions:  Your physician recommends that you continue on your current medications as directed. Please refer to the Current Medication list given to you today.  *If you need a refill on your cardiac medications before your next appointment, please call your pharmacy*  Lab Work: [place] TEST TODAY   Testing/Procedures: NONE   Follow-Up: At [facility], you and your health needs are our priority.  As part of our continuing mission to provide you with exceptional heart care, we have created designated Provider Care Teams.  These Care Teams include your primary Cardiologist (physician) and Advanced Practice Providers (APPs -  Physician Assistants and Nurse Practitioners) who all work together to provide you with the care you need, when you need it.  Your next appointment:   04/08/2019 at 1:30 pm in office with Lurena Joiner K PA   Other Instructions WILL ARRANGE FOR YOU TO GET A BLOOD PRESSURE MACHINE FOR HOME USE  MONITOR BLOOD PRESSURE AT HOME AND BRING TO YOUR FOLLOW UP VISIT

## 2018-12-24 ENCOUNTER — Other Ambulatory Visit: Payer: Self-pay

## 2018-12-24 DIAGNOSIS — Z20822 Contact with and (suspected) exposure to covid-19: Secondary | ICD-10-CM

## 2018-12-27 LAB — NOVEL CORONAVIRUS, NAA: SARS-CoV-2, NAA: NOT DETECTED

## 2019-01-13 ENCOUNTER — Telehealth: Payer: Medicaid Other | Admitting: Nurse Practitioner

## 2019-01-13 DIAGNOSIS — J0100 Acute maxillary sinusitis, unspecified: Secondary | ICD-10-CM

## 2019-01-13 MED ORDER — AMOXICILLIN-POT CLAVULANATE 875-125 MG PO TABS: 1.0000 | ORAL_TABLET | Freq: Two times a day (BID) | ORAL | 0 refills | Status: DC

## 2019-01-13 NOTE — Progress Notes (Signed)
We are sorry that you are not feeling well.  Here is how we plan to help!  Based on what you have shared with me it looks like you have sinusitis.  Sinusitis is inflammation and infection in the sinus cavities of the head.  Based on your presentation I believe you most likely have Acute Bacterial Sinusitis.  This is an infection caused by bacteria and is treated with antibiotics. I have prescribed Augmentin 875mg /125mg  one tablet twice daily with food, for 7 days. You may use an oral decongestant such as Mucinex D or if you have glaucoma or high blood pressure use plain Mucinex. Saline nasal spray help and can safely be used as often as needed for congestion.  If you develop worsening sinus pain, fever or notice severe headache and vision changes, or if symptoms are not better after completion of antibiotic, please schedule an appointment with a health care provider.    Sinus infections are not as easily transmitted as other respiratory infection, however we still recommend that you avoid close contact with loved ones, especially the very young and elderly.  Remember to wash your hands thoroughly throughout the day as this is the number one way to prevent the spread of infection!   There is something in the back of your nose that you can not see unless you are looking for it. It is called a turbunate and it is normal. I am not saying that I am 10% sur ethat is what you are seeing. If it continues to bityher you you can make an appointment to see your PCP so they can look in your nose.  Home Care:  Only take medications as instructed by your medical team.  Complete the entire course of an antibiotic.  Do not take these medications with alcohol.  A steam or ultrasonic humidifier can help congestion.  You can place a towel over your head and breathe in the steam from hot water coming from a faucet.  Avoid close contacts especially the very young and the elderly.  Cover your mouth when you cough or  sneeze.  Always remember to wash your hands.  Get Help Right Away If:  You develop worsening fever or sinus pain.  You develop a severe head ache or visual changes.  Your symptoms persist after you have completed your treatment plan.  Make sure you  Understand these instructions.  Will watch your condition.  Will get help right away if you are not doing well or get worse.  Your e-visit answers were reviewed by a board certified advanced clinical practitioner to complete your personal care plan.  Depending on the condition, your plan could have included both over the counter or prescription medications.  If there is a problem please reply  once you have received a response from your provider.  Your safety is important to [place].  If you have drug allergies check your prescription carefully.    You can use MyChart to ask questions about today's visit, request a non-urgent call back, or ask for a work or school excuse for 24 hours related to this e-Visit. If it has been greater than 24 hours you will need to follow up with your provider, or enter a new e-Visit to address those concerns.  You will get an e-mail in the next two days asking about your experience.  I hope that your e-visit has been valuable and will speed your recovery. Thank you for using e-visits.   5-10 minutes spent reviewing  and documenting in chart.  

## 2019-02-26 ENCOUNTER — Other Ambulatory Visit: Payer: Self-pay

## 2019-02-26 ENCOUNTER — Ambulatory Visit: Payer: Self-pay | Attending: Family Medicine | Admitting: Family Medicine

## 2019-02-26 ENCOUNTER — Encounter: Payer: Self-pay | Admitting: Family Medicine

## 2019-02-26 DIAGNOSIS — R05 Cough: Secondary | ICD-10-CM

## 2019-02-26 DIAGNOSIS — R058 Other specified cough: Secondary | ICD-10-CM

## 2019-02-26 DIAGNOSIS — Z5731 Occupational exposure to environmental tobacco smoke: Secondary | ICD-10-CM

## 2019-02-26 DIAGNOSIS — R0981 Nasal congestion: Secondary | ICD-10-CM

## 2019-02-26 MED ORDER — AMOXICILLIN-POT CLAVULANATE 875-125 MG PO TABS: 1.0000 | ORAL_TABLET | Freq: Two times a day (BID) | ORAL | 0 refills | Status: DC

## 2019-02-26 MED ORDER — AZITHROMYCIN 250 MG PO TABS: ORAL_TABLET | ORAL | 0 refills | Status: DC

## 2019-02-26 NOTE — Progress Notes (Signed)
Pt. Is here to establish care.  Pt. Had a cold, now she is dealing with sinus congestion and cough.

## 2019-02-26 NOTE — Progress Notes (Signed)
Virtual Visit via Telephone Note  I connected with Carolyn Lynn on 05/24/19 at  1:50 PM EST by telephone and verified that I am speaking with the correct person using two identifiers.   I discussed the limitations, risks, security and privacy concerns of performing an evaluation and management service by telephone and the availability of in person appointments. I also discussed with the patient that there may be a patient responsible charge related to this service. The patient expressed understanding and agreed to proceed.  Patient Location: Home Provider Location: [facility] office Others participating in call: None   History of Present Illness:        34 yo female with complaint of a recurrent cough since she and her son caught a cold back in October. Cough is non-productive. Cough is worse at night. OTC allergy medication has not helped.  Patient reports nasal congestion which has been somewhat chronic.  Sputum is not discolored with cough.  Cough usually occurs without sputum production.  Cough does interrupt her sleep.  She currently takes over-the-counter Benadryl to help with nasal congestion.  She does not really have sensation of chest congestion or wheezing.  Occasional frontal headache.  No dizziness.  She has had no fever or chills.  She does not currently smoke but lives with a smoker.  She does have history of heart failure after childbirth but believes that this has resolved.   Past Medical History:  Diagnosis Date  . Anemia   . Chronic combined systolic and diastolic heart failure (HCC) 03/29/2016  . Chronic kidney disease    left kidney smaller than right  . History of blood transfusion 12/2015  . Medical history non-contributory     Past Surgical History:  Procedure Laterality Date  . LAPAROSCOPY N/A 05/03/2016   Procedure: LAPAROSCOPY DIAGNOSTIC;  Surgeon: Adam Phenix, MD;  Location: [facility];  Service: Gynecology;  Laterality: N/A;  . LAPAROTOMY N/A 05/03/2016   Procedure: LAPAROTOMY;  Surgeon: Adam Phenix, MD;  Location: [facility];  Service: Gynecology;  Laterality: N/A;  . NO PAST SURGERIES    . SALPINGOOPHORECTOMY Left 05/03/2016   Procedure: SALPINGO OOPHORECTOMY;  Surgeon: Adam Phenix, MD;  Location: [facility];  Service: Gynecology;  Laterality: Left;    Family History  Problem Relation Age of Onset  . Hypertension Mother   . Asthma Brother     Social History   Tobacco Use  . Smoking status: Never Smoker  . Smokeless tobacco: Never Used  Substance Use Topics  . Alcohol use: No  . Drug use: No     No Known Allergies     Observations/Objective: No vital signs or physical exam conducted as visit was done via telephone  Assessment and Plan: 1. Recurrent non-productive; 2.  Nasal congestion Discussed with patient that she likely has allergic rhinitis/postnasal drainage and potentially a sinus infection.  She denies any current fever chills and cough is nonproductive therefore less likely that her cough represents a pneumonia or bacterial bronchitis.  In case she does have an atypical pneumonia, she has been asked to obtain a chest x-ray and this would also show if she has any other issues such as pulmonary edema which may be contributing to her chronic cough.  She is encouraged to take an over-the-counter 24-hour antihistamine such as Allegra, Zyrtec or Claritin to see if this helps with her postnasal drainage versus her current use of short acting Benadryl.  If her blood pressure is normal, she may also wish  to try an over-the-counter medication such as Sudafed to help with nasal congestion.  Patient was initially offered azithromycin Z-Pak in case of atypical pneumonia as a cause of her cough but she states that she has recently been on this antibiotic therefore will prescribe Augmentin which would treat community-acquired pneumonia or sinusitis.  Follow-up in approximately 3 weeks if cough is not improved but sooner if worsening of cough,  discolored sputum, fever and chills or any other concerns. - DG Chest 2 View; Future     Follow Up Instructions:Return in about 3 weeks (around 03/19/2019) for cough.    I discussed the assessment and treatment plan with the patient. The patient was provided an opportunity to ask questions and all were answered. The patient agreed with the plan and demonstrated an understanding of the instructions.   The patient was advised to call back or seek an in-person evaluation if the symptoms worsen or if the condition fails to improve as anticipated.  I provided 12 minutes of non-face-to-face time during this encounter.   Antony Blackbird, MD

## 2019-02-28 ENCOUNTER — Emergency Department (HOSPITAL_COMMUNITY): Payer: Medicaid Other

## 2019-02-28 ENCOUNTER — Other Ambulatory Visit: Payer: Self-pay

## 2019-02-28 ENCOUNTER — Emergency Department (HOSPITAL_COMMUNITY)
Admission: EM | Admit: 2019-02-28 | Discharge: 2019-02-28 | Disposition: A | Discharge status: Home / Self Care | Payer: Medicaid Other | Attending: Emergency Medicine | Admitting: Emergency Medicine

## 2019-02-28 ENCOUNTER — Encounter (HOSPITAL_COMMUNITY): Payer: Self-pay | Admitting: Emergency Medicine

## 2019-02-28 DIAGNOSIS — R053 Chronic cough: Secondary | ICD-10-CM

## 2019-02-28 DIAGNOSIS — Z20822 Contact with and (suspected) exposure to covid-19: Secondary | ICD-10-CM | POA: Insufficient documentation

## 2019-02-28 DIAGNOSIS — I5042 Chronic combined systolic (congestive) and diastolic (congestive) heart failure: Secondary | ICD-10-CM | POA: Insufficient documentation

## 2019-02-28 DIAGNOSIS — R05 Cough: Secondary | ICD-10-CM

## 2019-02-28 DIAGNOSIS — J309 Allergic rhinitis, unspecified: Secondary | ICD-10-CM | POA: Insufficient documentation

## 2019-02-28 LAB — SARS CORONAVIRUS 2 (TAT 6-24 HRS): SARS Coronavirus 2: NEGATIVE

## 2019-02-28 MED ORDER — PROMETHAZINE-DM 6.25-15 MG/5ML PO SYRP: 5.0000 mL | ORAL_SOLUTION | Freq: Four times a day (QID) | ORAL | 0 refills | Status: DC | PRN

## 2019-02-28 NOTE — Discharge Instructions (Addendum)
You were seen in the ER for chronic nasal congestion and runny nose.  Chest x-ray today was normal.  You had a cold in October and I suspect part of your cough is due to post viral cough inflammation in your airways.  Untreated allergies can also cause nasal congestion, postnasal drip and irritate your throat causing a cough reflex  We will treat your symptoms with over-the-counter medicines to help with allergic sinusitis and cough  Use over the counter flonase morning and night  Normal saline or water nasal rinses to remove allergens morning and night  Allergy medicine (loratadine, ceterizine, etc) every morning  Use promethazine DM prescribed suspension for next 7 days to help with cough  Follow up with primary care doctor in 7-10 days if symptoms do not improve  Return to ER for fever, worsening or productive cough, chest pain or shortness of breath   We tested you for COVID-19, follow up on these results on MyChart.  Isolate until your test result returns.

## 2019-02-28 NOTE — ED Notes (Signed)
Patient verbalizes understanding of discharge instructions. Opportunity for questioning and answers were provided. Armband removed by staff, pt discharged from ED.  

## 2019-02-28 NOTE — ED Provider Notes (Signed)
[name] EMERGENCY DEPARTMENT Provider Note   CSN: [id number] Arrival date & time: 02/28/19  1572     History Chief Complaint  Patient presents with  . Cough    Carolyn Lynn is a 34 y.o. female with history of peripartum cardiomyopathy and chronic systolic/diastolic HF in 6203, seasonal allergies presents to ER for chest x-ray.  [facility] who advised her to come to ER for chest x-ray.  Reports non productive daily cough since 11/2018.  Both her and her son had a bad cold that month prior to onset of cough.  Reports associated nasal congestion, rhinorrhea, post nasal drip.  She take benadryl as needed for allergies but not very often.  Has a dog at her home for many years.  Boyfriend smokes cigarettes.  Denies personal tobacco use.  No recent medicines. She does not take lisinopril anymore.  Denies sick contacts. Denies associated fever, sore throat, chest pain, shortness of breath, vomiting, diarrhea, loss of taste or smell, headaches, body aches. Denies peripheral edema. Denies history of acid reflux.   HPI     Past Medical History:  Diagnosis Date  . Anemia   . Chronic combined systolic and diastolic heart failure ([place]) 03/29/2016  . Chronic kidney disease    left kidney smaller than right  . History of blood transfusion 12/2015  . Medical history non-contributory     Patient Active Problem List   Diagnosis Date Noted  . Oral contraception initial prescription 06/21/2016  . Left tubo-ovarian abscess 05/03/2016  . Chronic combined systolic and diastolic heart failure ([place]) 03/29/2016  . Hydronephrosis, left   . Peripartum cardiomyopathy   . Hepatosplenomegaly 12/14/2015  . Anemia, iron deficiency 12/14/2015    Past Surgical History:  Procedure Laterality Date  . LAPAROSCOPY N/A 05/03/2016   Procedure: LAPAROSCOPY DIAGNOSTIC;  Surgeon: Woodroe Mode, MD;  Location: [facility];  Service: Gynecology;  Laterality: N/A;  .  LAPAROTOMY N/A 05/03/2016   Procedure: LAPAROTOMY;  Surgeon: Woodroe Mode, MD;  Location: [facility];  Service: Gynecology;  Laterality: N/A;  . NO PAST SURGERIES    . SALPINGOOPHORECTOMY Left 05/03/2016   Procedure: SALPINGO OOPHORECTOMY;  Surgeon: Woodroe Mode, MD;  Location: [facility];  Service: Gynecology;  Laterality: Left;     OB History    Gravida  1   Para  1   Term  1   Preterm      AB      Living  1     SAB      TAB      Ectopic      Multiple  0   Live Births  1           Family History  Problem Relation Age of Onset  . Hypertension Mother   . Asthma Brother     Social History   Tobacco Use  . Smoking status: Never Smoker  . Smokeless tobacco: Never Used  Substance Use Topics  . Alcohol use: No  . Drug use: No    Home Medications Prior to Admission medications   Medication Sig Start Date End Date Taking? Authorizing Provider  amoxicillin-clavulanate (AUGMENTIN) 875-125 MG tablet Take 1 tablet by mouth 2 (two) times daily. 02/26/19   Fulp, Cammie, MD  azithromycin (ZITHROMAX) 250 MG tablet Take 2 pills on the first day then one pill daily for 4 days 02/26/19   Antony Blackbird, MD  promethazine-dextromethorphan (PROMETHAZINE-DM) 6.25-15 MG/5ML syrup Take  5 mLs by mouth 4 (four) times daily as needed for cough. 02/28/19   Liberty Handy, PA-C    Allergies    Patient has no known allergies.  Review of Systems   Review of Systems  HENT: Positive for congestion, postnasal drip and rhinorrhea.   Respiratory: Positive for cough.   All other systems reviewed and are negative.   Physical Exam Updated Vital Signs BP 114/84 (BP Location: Right Arm)   Pulse 67   Temp 98.3 F (36.8 C) (Oral)   Resp 16   Ht 5\' 4"  (1.626 m)   Wt 71.7 kg   LMP  (LMP Unknown)   SpO2 100%   BMI 27.12 kg/m   Physical Exam Vitals and nursing note reviewed.  Constitutional:      General: She is not in acute distress.    Appearance: She is well-developed.      Comments: NAD.  HENT:     Head: Normocephalic and atraumatic.     Right Ear: External ear normal.     Left Ear: External ear normal.     Nose: Congestion and rhinorrhea present.     Comments: Moderately edematous nasal mucosa/turbinates with erythema and clear rhinorrhea. Septum midline.  Eyes:     General: No scleral icterus.    Conjunctiva/sclera: Conjunctivae normal.  Cardiovascular:     Rate and Rhythm: Normal rate and regular rhythm.     Heart sounds: Normal heart sounds.     Comments: No LE edema  Pulmonary:     Effort: Pulmonary effort is normal.     Breath sounds: Normal breath sounds.  Musculoskeletal:        General: No deformity. Normal range of motion.     Cervical back: Normal range of motion and neck supple.  Skin:    General: Skin is warm and dry.     Capillary Refill: Capillary refill takes less than 2 seconds.  Neurological:     Mental Status: She is alert and oriented to person, place, and time.  Psychiatric:        Behavior: Behavior normal.        Thought Content: Thought content normal.        Judgment: Judgment normal.     ED Results / Procedures / Treatments   Labs (all labs ordered are listed, but only abnormal results are displayed) Labs Reviewed  SARS CORONAVIRUS 2 (TAT 6-24 HRS)    EKG None  Radiology DG Chest Portable 1 View  Result Date: 02/28/2019 CLINICAL DATA:  Cough EXAM: PORTABLE CHEST 1 VIEW COMPARISON:  None. FINDINGS: Normal mediastinum and cardiac silhouette. Normal pulmonary vasculature. No evidence of effusion, infiltrate, or pneumothorax. No acute bony abnormality. IMPRESSION: Normal chest radiograph. Electronically Signed   By: 03/02/2019 M.D.   On: 02/28/2019 10:21    Procedures Procedures (including critical care time)  Medications Ordered in ED Medications - No data to display  ED Course  I have reviewed the triage vital signs and the nursing notes.  Pertinent labs & imaging results that were available during  my care of the patient were reviewed by me and considered in my medical decision making (see chart for details).    MDM Rules/Calculators/A&P                      EMR reviewed.  Peripartum cardiomyopathy with combined heart failure in 2018.  She has had cardiology follow-up appointment since and appears to be stable from this standpoint.  She is no longer taking lisinopril.  She reports a chronic nonproductive cough with congestion, postnasal drip.  Untreated seasonal allergies.  She also had a bad cold prior to this.  Highest  On DDX is post viral cough syndrome versus allergic cough.  She denies history of acid reflux or symptoms of this.  Exam is very benign, afebrile.  Lungs clear.  No signs of hypervolemia.  Chest x-ray is nonacute without edema, infiltrates.  Given symptoms and current pandemic Covid test sent but I have low suspicion for this.  She has a dog and boyfriend smokes, discussed this could be exacerbating her cough.  Recommended adherence to daily allergic rhinitis medicines including antihistamine, Flonase and cough medicine.  Recommended follow-up with PCP in 7 to 10 days for reevaluation if symptoms do not improve. Return precautions given. Patient in agreement with POC.   Final Clinical Impression(s) / ED Diagnoses Final diagnoses:  Chronic cough  Allergic rhinitis, mild    Rx / DC Orders ED Discharge Orders         Ordered    promethazine-dextromethorphan (PROMETHAZINE-DM) 6.25-15 MG/5ML syrup  4 times daily PRN     02/28/19 1250           Liberty Handy, PA-C 02/28/19 1250    Geoffery Lyons, MD 03/01/19 0710

## 2019-02-28 NOTE — ED Triage Notes (Signed)
[facility] and was advised to come to the ED to have a chest XR done d/t a dry cough that has been persistent since 11/2018. Endorses congestion, denies fever, denies covid exposure.

## 2019-04-01 ENCOUNTER — Other Ambulatory Visit: Payer: Self-pay

## 2019-04-01 ENCOUNTER — Encounter: Payer: Self-pay | Admitting: Cardiology

## 2019-04-01 ENCOUNTER — Ambulatory Visit (INDEPENDENT_AMBULATORY_CARE_PROVIDER_SITE_OTHER): Payer: Self-pay | Admitting: Cardiology

## 2019-04-01 VITALS — BP 130/72 | HR 67 | Temp 97.1°F | Ht 64.0 in | Wt 156.0 lb

## 2019-04-01 DIAGNOSIS — O903 Peripartum cardiomyopathy: Secondary | ICD-10-CM

## 2019-04-01 NOTE — Progress Notes (Signed)
Cardiology Office Note:    Date:  04/01/2019   ID:  Carolyn Lynn, DOB 11-30-1985, MRN [id number]  PCP:  Pete Glatter, MD (Inactive)  Cardiologist:  Dr [facility] Electrophysiologist:  None   Referring MD: No ref. provider found   No chief complaint on file.   History of Present Illness:    Carolyn Lynn is a 34 y.o. female with a hx of postpartum cardiomyopathy in 2018.  Ejection fraction by echocardiogram was 45%.  She was placed on an ACE inhibitor for time but has since stopped this on her own.  Her hospital course was complicated by bleeding requiring transfusion and ultimately another operation.  She is done well since.  Her son is now 67 years old and doing well.  The patient works in Building control surveyor.  She is not sure if she wants to get pregnant again or not.  Since we saw her last she has been doing well.  She denies any unusual shortness of breath, orthopnea, or lower extremity edema.  She was seen in the emergency room in January of this year with a cough and treated with antibiotics.  She has since recovered.  She does not exercise on a regular basis but knows this is something she should do, she is waiting for the weather to get a little better.  Past Medical History:  Diagnosis Date  . Anemia   . Chronic combined systolic and diastolic heart failure (HCC) 03/29/2016  . Chronic kidney disease    left kidney smaller than right  . History of blood transfusion 12/2015  . Medical history non-contributory     Past Surgical History:  Procedure Laterality Date  . LAPAROSCOPY N/A 05/03/2016   Procedure: LAPAROSCOPY DIAGNOSTIC;  Surgeon: Adam Phenix, MD;  Location: [facility];  Service: Gynecology;  Laterality: N/A;  . LAPAROTOMY N/A 05/03/2016   Procedure: LAPAROTOMY;  Surgeon: Adam Phenix, MD;  Location: [facility];  Service: Gynecology;  Laterality: N/A;  . NO PAST SURGERIES    . SALPINGOOPHORECTOMY Left 05/03/2016   Procedure: SALPINGO OOPHORECTOMY;  Surgeon: Adam Phenix, MD;  Location: [facility];  Service: Gynecology;  Laterality: Left;    Current Medications: Current Meds  Medication Sig  . amoxicillin-clavulanate (AUGMENTIN) 875-125 MG tablet Take 1 tablet by mouth 2 (two) times daily.  Marland Kitchen azithromycin (ZITHROMAX) 250 MG tablet Take 2 pills on the first day then one pill daily for 4 days  . promethazine-dextromethorphan (PROMETHAZINE-DM) 6.25-15 MG/5ML syrup Take 5 mLs by mouth 4 (four) times daily as needed for cough.     Allergies:   Patient has no known allergies.   Social History   Socioeconomic History  . Marital status: Single    Spouse name: Not on file  . Number of children: Not on file  . Years of education: Not on file  . Highest education level: Not on file  Occupational History  . Not on file  Tobacco Use  . Smoking status: Never Smoker  . Smokeless tobacco: Never Used  Substance and Sexual Activity  . Alcohol use: No  . Drug use: No  . Sexual activity: Yes    Birth control/protection: None  Other Topics Concern  . Not on file  Social History Narrative  . Not on file   Social Determinants of Health   Financial Resource Strain:   . Difficulty of Paying Living Expenses: Not on file  Food Insecurity:   . Worried About Programme researcher, broadcasting/film/video in the  Last Year: Not on file  . Ran Out of Food in the Last Year: Not on file  Transportation Needs:   . Lack of Transportation (Medical): Not on file  . Lack of Transportation (Non-Medical): Not on file  Physical Activity:   . Days of Exercise per Week: Not on file  . Minutes of Exercise per Session: Not on file  Stress:   . Feeling of Stress : Not on file  Social Connections:   . Frequency of Communication with Friends and Family: Not on file  . Frequency of Social Gatherings with Friends and Family: Not on file  . Attends Religious Services: Not on file  . Active Member of Clubs or Organizations: Not on file  . Attends Banker Meetings: Not on file  . Marital  Status: Not on file     Family History: The patient's family history includes Asthma in her brother; Hypertension in her mother.  ROS:   Please see the history of present illness.     All other systems reviewed and are negative.  EKGs/Labs/Other Studies Reviewed:    The following studies were reviewed today: Echo 2018  EKG:  EKG is ordered today.  The ekg ordered today demonstrates NSR, HR 67  Recent Labs: No results found for requested labs within last 8760 hours.  Recent Lipid Panel No results found for: CHOL, TRIG, HDL, CHOLHDL, VLDL, LDLCALC, LDLDIRECT  Physical Exam:    VS:  BP 130/72   Pulse 67   Temp (!) 97.1 F (36.2 C)   Ht 5\' 4"  (1.626 m)   Wt 156 lb (70.8 kg)   SpO2 99%   BMI 26.78 kg/m     Wt Readings from Last 3 Encounters:  04/01/19 156 lb (70.8 kg)  02/28/19 158 lb (71.7 kg)  12/23/18 158 lb (71.7 kg)     GEN:  Well nourished, well developed in no acute distress HEENT: Normal NECK: No JVD; No carotid bruits CARDIAC: RRR, no murmurs, rubs, gallops RESPIRATORY:  Clear to auscultation without rales, wheezing or rhonchi  ABDOMEN: Soft, non-tender, non-distended MUSCULOSKELETAL:  No edema; No deformity  SKIN: Warm and dry NEUROLOGIC:  Alert and oriented x 3 PSYCHIATRIC:  Normal affect   ASSESSMENT:    Peripartum cardiomyopathy EF 45% by echo Feb 2018-no symptoms concerning for CHF currently  PLAN:    I'll discuss with Dr 03-04-1999- ? Consider repeat echo- otherwise f/u in on year.    Medication Adjustments/Labs and Tests Ordered: Current medicines are reviewed at length with the patient today.  Concerns regarding medicines are outlined above.  No orders of the defined types were placed in this encounter.  No orders of the defined types were placed in this encounter.   Patient Instructions  Medication Instructions:  Your physician recommends that you continue on your current medications as directed. Please refer to the Current Medication  list given to you today.  *If you need a refill on your cardiac medications before your next appointment, please call your pharmacy*  Lab Work: NONE  Testing/Procedures: NONE  Follow-Up: At [facility], you and your health needs are our priority.  As part of our continuing mission to provide you with exceptional heart care, we have created designated Provider Care Teams.  These Care Teams include your primary Cardiologist (physician) and Advanced Practice Providers (APPs -  Physician Assistants and Nurse Practitioners) who all work together to provide you with the care you need, when you need it.  Your next appointment:  12 month(s)  The format for your next appointment:   In Person  Provider:   Skeet Latch, MD       Signed, Kerin Ransom, PA-C  04/01/2019 2:12 PM    [place]

## 2019-04-01 NOTE — Assessment & Plan Note (Addendum)
EF 45% by echo Feb 2018-no symptoms concerning for CHF currently

## 2019-04-01 NOTE — Patient Instructions (Signed)
Medication Instructions:  Your physician recommends that you continue on your current medications as directed. Please refer to the Current Medication list given to you today.  *If you need a refill on your cardiac medications before your next appointment, please call your pharmacy*  Lab Work: NONE  Testing/Procedures: NONE  Follow-Up: At [facility], you and your health needs are our priority.  As part of our continuing mission to provide you with exceptional heart care, we have created designated Provider Care Teams.  These Care Teams include your primary Cardiologist (physician) and Advanced Practice Providers (APPs -  Physician Assistants and Nurse Practitioners) who all work together to provide you with the care you need, when you need it.  Your next appointment:   12 month(s)  The format for your next appointment:   In Person  Provider:   Chilton Si, MD

## 2019-04-08 ENCOUNTER — Ambulatory Visit: Payer: Medicaid Other | Admitting: Cardiology

## 2019-05-01 ENCOUNTER — Ambulatory Visit: Payer: Medicaid Other

## 2019-05-05 ENCOUNTER — Telehealth: Payer: Medicaid Other | Admitting: Nurse Practitioner

## 2019-05-05 DIAGNOSIS — R059 Cough, unspecified: Secondary | ICD-10-CM

## 2019-05-05 DIAGNOSIS — R05 Cough: Secondary | ICD-10-CM

## 2019-05-05 MED ORDER — BENZONATATE 100 MG PO CAPS: 100.0000 mg | ORAL_CAPSULE | Freq: Three times a day (TID) | ORAL | 0 refills | Status: DC | PRN

## 2019-05-05 MED ORDER — AZITHROMYCIN 250 MG PO TABS
ORAL_TABLET | ORAL | 0 refills | Status: DC
Start: 2019-05-05 — End: 2019-07-23

## 2019-05-05 NOTE — Progress Notes (Signed)
We are sorry that you are not feeling well.  Here is how we plan to help!  Based on your presentation I believe you most likely have A cough due to bacteria.  When patients have a fever and a productive cough with a change in color or increased sputum production, we are concerned about bacterial bronchitis.  If left untreated it can progress to pneumonia.  If your symptoms do not improve with your treatment plan it is important that you contact your provider.   I have prescribed Azithromyin 250 mg: two tablets now and then one tablet daily for 4 additonal days    In addition you may use A prescription cough medication called Tessalon Perles 100mg. You may take 1-2 capsules every 8 hours as needed for your cough.   From your responses in the eVisit questionnaire you describe inflammation in the upper respiratory tract which is causing a significant cough.  This is commonly called Bronchitis and has four common causes:    Allergies  Viral Infections  Acid Reflux  Bacterial Infection Allergies, viruses and acid reflux are treated by controlling symptoms or eliminating the cause. An example might be a cough caused by taking certain blood pressure medications. You stop the cough by changing the medication. Another example might be a cough caused by acid reflux. Controlling the reflux helps control the cough.  USE OF BRONCHODILATOR ("RESCUE") INHALERS: There is a risk from using your bronchodilator too frequently.  The risk is that over-reliance on a medication which only relaxes the muscles surrounding the breathing tubes can reduce the effectiveness of medications prescribed to reduce swelling and congestion of the tubes themselves.  Although you feel brief relief from the bronchodilator inhaler, your asthma may actually be worsening with the tubes becoming more swollen and filled with mucus.  This can delay other crucial treatments, such as oral steroid medications. If you need to use a  bronchodilator inhaler daily, several times per day, you should discuss this with your provider.  There are probably better treatments that could be used to keep your asthma under control.     HOME CARE . Only take medications as instructed by your medical team. . Complete the entire course of an antibiotic. . Drink plenty of fluids and get plenty of rest. . Avoid close contacts especially the very young and the elderly . Cover your mouth if you cough or cough into your sleeve. . Always remember to wash your hands . A steam or ultrasonic humidifier can help congestion.   GET HELP RIGHT AWAY IF: . You develop worsening fever. . You become short of breath . You cough up blood. . Your symptoms persist after you have completed your treatment plan MAKE SURE YOU   Understand these instructions.  Will watch your condition.  Will get help right away if you are not doing well or get worse.  Your e-visit answers were reviewed by a board certified advanced clinical practitioner to complete your personal care plan.  Depending on the condition, your plan could have included both over the counter or prescription medications. If there is a problem please reply  once you have received a response from your provider. Your safety is important to us.  If you have drug allergies check your prescription carefully.    You can use MyChart to ask questions about today's visit, request a non-urgent call back, or ask for a work or school excuse for 24 hours related to this e-Visit. If it has been   greater than 24 hours you will need to follow up with your provider, or enter a new e-Visit to address those concerns. You will get an e-mail in the next two days asking about your experience.  I hope that your e-visit has been valuable and will speed your recovery. Thank you for using e-visits.  5-10 minutes spent reviewing and documenting in chart.  

## 2019-05-24 ENCOUNTER — Encounter: Payer: Self-pay | Admitting: Family Medicine

## 2019-06-04 ENCOUNTER — Encounter: Payer: Self-pay | Admitting: Family Medicine

## 2019-06-05 ENCOUNTER — Other Ambulatory Visit: Payer: Self-pay | Admitting: Family Medicine

## 2019-06-05 DIAGNOSIS — R05 Cough: Secondary | ICD-10-CM

## 2019-06-05 DIAGNOSIS — R058 Other specified cough: Secondary | ICD-10-CM

## 2019-06-05 DIAGNOSIS — R0981 Nasal congestion: Secondary | ICD-10-CM

## 2019-06-05 MED ORDER — CETIRIZINE HCL 10 MG PO TABS: 10.0000 mg | ORAL_TABLET | Freq: Every day | ORAL | 11 refills | Status: DC

## 2019-06-05 MED ORDER — IPRATROPIUM BROMIDE 0.03 % NA SOLN: 2.0000 | Freq: Two times a day (BID) | NASAL | 12 refills | Status: DC

## 2019-06-05 NOTE — Progress Notes (Signed)
Patient ID: Carolyn Lynn, female   DOB: Oct 13, 1985, 34 y.o.   MRN: [id number]   Patient with complaint of chronic issues with clear thin nasal drainage as well as chronic cough.  Patient will be referred to an allergist for further evaluation and treatment.  Prescription sent to patient's pharmacy for cetirizine and ipratropium nasal spray

## 2019-07-14 ENCOUNTER — Ambulatory Visit (INDEPENDENT_AMBULATORY_CARE_PROVIDER_SITE_OTHER): Payer: Self-pay | Admitting: Allergy and Immunology

## 2019-07-14 ENCOUNTER — Encounter: Payer: Self-pay | Admitting: Allergy and Immunology

## 2019-07-14 ENCOUNTER — Other Ambulatory Visit: Payer: Self-pay

## 2019-07-14 VITALS — HR 79 | Temp 98.3°F | Resp 18 | Ht 64.0 in | Wt 163.3 lb

## 2019-07-14 DIAGNOSIS — Z7722 Contact with and (suspected) exposure to environmental tobacco smoke (acute) (chronic): Secondary | ICD-10-CM

## 2019-07-14 DIAGNOSIS — J3089 Other allergic rhinitis: Secondary | ICD-10-CM

## 2019-07-14 DIAGNOSIS — K219 Gastro-esophageal reflux disease without esophagitis: Secondary | ICD-10-CM

## 2019-07-14 DIAGNOSIS — J4530 Mild persistent asthma, uncomplicated: Secondary | ICD-10-CM

## 2019-07-14 NOTE — Patient Instructions (Addendum)
  1.  Allergen avoidance measures? Eliminate tobacco smoke exposure  2.  Treat and prevent inflammation:   A.  Prednisone 10 mg - 1 tablet one time per day for 10 days  B.  OTC Nasacort -2 sprays each nostril one time per day  C.  Alvesco 160 -1 inhalation twice a day with spacer  3.  Treat and prevent reflux:   A.  Minimize all caffeine and chocolate consumption  B.  Omeprazole 40 mg - 1 tablet in a.m.  C.  Famotidine 40 mg - 1 tablet in p.m.  4.  If needed:   A. Albuterol HFA - 2 inhalations every 4-6 hours  B. OTC antihistamine  5.  Return to clinic in 3 weeks or earlier if problem  6.  Sinus CT scan???  Blood test???

## 2019-07-14 NOTE — Progress Notes (Signed)
- [place] - [place] - [place] - [place]   Dear Dr. Jillyn Hidden,  Thank you for referring Carolyn Lynn to the [facility] on 07/14/2019.   Below is a summation of this patient's evaluation and recommendations.  Thank you for your referral. I will keep you informed about this patient's response to treatment.   If you have any questions please do not hesitate to contact me.   Sincerely,  Jessica Priest, MD Allergy / Immunology [facility]   ______________________________________________________________________    NEW PATIENT NOTE  Referring Provider: Cain Saupe, MD Primary Provider: Pete Glatter, MD (Inactive) Date of office visit: 07/14/2019    Subjective:   Chief Complaint:  Carolyn Lynn (DOB: 1985/07/16) is a 34 y.o. female who presents to the clinic on 07/14/2019 with a chief complaint of Allergic Rhinitis  (constant drainage, runny nose) and Cough .     HPI: Carolyn Lynn presents to this clinic in evaluation of respiratory tract symptoms that have been present since October 2020.  Apparently in October 2020 she contracted a "cold" from her son with lots of nasal symptoms and cough.  Ever since that point in time she has been having spells of cough associated with posttussive emesis.  She coughs both during the daytime and nighttime.  This disturbs her sleep every night.  She does not really have any chest tightness or wheezing or inability to exercise in association with her cough.  She has been treated with a multitude of various agents to address this cough including several courses of antibiotics and antihistamines which has not helped.  Apparently she has had a chest x-ray which has been normal.  In addition, she has been having sneezing and some decreased ability to smell and clear rhinorrhea with a predilection for involvement of her right nostril more than her  left nostril since that point in time.  The material emanating from her nose has been clear.  She does not have any associated headaches.  She has tried various nasal sprays including what sounds like nasal steroids and saline and Afrin and antihistamines and as noted above antibiotics which has not really helped.  She does not have a history of reflux.  Past Medical History:  Diagnosis Date  . Anemia   . Chronic combined systolic and diastolic heart failure (HCC) 03/29/2016  . Chronic kidney disease    left kidney smaller than right  . History of blood transfusion 12/2015  . Medical history non-contributory     Past Surgical History:  Procedure Laterality Date  . LAPAROSCOPY N/A 05/03/2016   Procedure: LAPAROSCOPY DIAGNOSTIC;  Surgeon: Adam Phenix, MD;  Location: [facility];  Service: Gynecology;  Laterality: N/A;  . LAPAROTOMY N/A 05/03/2016   Procedure: LAPAROTOMY;  Surgeon: Adam Phenix, MD;  Location: [facility];  Service: Gynecology;  Laterality: N/A;  . NO PAST SURGERIES    . SALPINGOOPHORECTOMY Left 05/03/2016   Procedure: SALPINGO OOPHORECTOMY;  Surgeon: Adam Phenix, MD;  Location: [facility];  Service: Gynecology;  Laterality: Left;    Allergies as of 07/14/2019   No Known Allergies     Medication List      azithromycin 250 MG tablet Commonly known as: Zithromax Z-Pak As directed   benzonatate 100 MG capsule Commonly known as: Tessalon Perles Take 1 capsule (100 mg total) by mouth 3 (three) times daily as needed.   cetirizine 10 MG  tablet Commonly known as: ZYRTEC Take 1 tablet (10 mg total) by mouth daily.   Delsym 30 MG/5ML liquid Generic drug: dextromethorphan Take by mouth as needed for cough.   diphenhydrAMINE 25 MG tablet Commonly known as: BENADRYL Take 25 mg by mouth every 6 (six) hours as needed.   fexofenadine 30 MG/5ML suspension Commonly known as: ALLEGRA Take 30 mg by mouth daily.   ipratropium 0.03 % nasal spray Commonly known as: ATROVENT Place  2 sprays into both nostrils every 12 (twelve) hours.   promethazine-dextromethorphan 6.25-15 MG/5ML syrup Commonly known as: PROMETHAZINE-DM Take 5 mLs by mouth 4 (four) times daily as needed for cough.       Review of systems negative except as noted in HPI / PMHx or noted below:  Review of Systems  Constitutional: Negative.   HENT: Negative.   Eyes: Negative.   Respiratory: Negative.   Cardiovascular: Negative.   Gastrointestinal: Negative.   Genitourinary: Negative.   Musculoskeletal: Negative.   Skin: Negative.   Neurological: Negative.   Endo/Heme/Allergies: Negative.   Psychiatric/Behavioral: Negative.     Family History  Problem Relation Age of Onset  . Hypertension Mother   . Asthma Brother   . Cancer Sister     Social History   Socioeconomic History  . Marital status: Single    Spouse name: Not on file  . Number of children: Not on file  . Years of education: Not on file  . Highest education level: Not on file  Occupational History  . Not on file  Tobacco Use  . Smoking status: Never Smoker  . Smokeless tobacco: Never Used  Substance and Sexual Activity  . Alcohol use: No  . Drug use: No  . Sexual activity: Yes    Birth control/protection: None  Other Topics Concern  . Not on file  Social History Narrative  . 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  Objective:   Vitals:   07/14/19 1338  Pulse: 79  Resp: 18  Temp: 98.3 F (36.8 C)  SpO2: 98%   Height: 5\' 4"  (162.6 cm) Weight: 163 lb 4.8 oz (74.1 kg)  Physical Exam Constitutional:      Appearance: She is not diaphoretic.  HENT:     Head: Normocephalic.     Right Ear: Tympanic membrane, ear canal and external ear normal.     Left  Ear: Tympanic membrane, ear canal and external ear normal.     Nose: Nose normal. No mucosal edema or rhinorrhea.     Mouth/Throat:     Pharynx: Uvula midline. No oropharyngeal exudate.  Eyes:     Conjunctiva/sclera: Conjunctivae normal.  Neck:     Thyroid: No thyromegaly.     Trachea: Trachea normal. No tracheal tenderness or tracheal deviation.  Cardiovascular:     Rate and Rhythm: Normal rate and regular rhythm.     Heart sounds: Normal heart sounds, S1 normal and S2 normal. No murmur.  Pulmonary:     Effort: No respiratory distress.     Breath sounds: Normal breath sounds. No stridor. No wheezing or rales.  Lymphadenopathy:     Head:     Right side of head: No tonsillar adenopathy.     Left side of  head: No tonsillar adenopathy.     Cervical: No cervical adenopathy.  Skin:    Findings: No erythema or rash.     Nails: There is no clubbing.  Neurological:     Mental Status: She is alert.     Diagnostics: Allergy skin tests were performed.  She did not demonstrate any hypersensitivity against a screening panel of aeroallergens or foods.  Spirometry was performed and demonstrated an FEV1 of 2.04 @ 76 % of predicted. FEV1/FVC = 0.75.  Following administration of inhaled albuterol her FEV1 did not improve significantly.  Results of a chest x-ray obtained 28 February 2019 identified the following:  Normal mediastinum and cardiac silhouette. Normal pulmonary vasculature. No evidence of effusion, infiltrate, or pneumothorax. No acute bony abnormality.   Assessment and Plan:    1. Not well controlled mild persistent asthma   2. Perennial allergic rhinitis   3. LPRD (laryngopharyngeal reflux disease)   4. Secondhand smoke exposure     1.  Allergen avoidance measures? Eliminate tobacco smoke exposure  2.  Treat and prevent inflammation:   A.  Prednisone 10 mg - 1 tablet one time per day for 10 days  B.  OTC Nasacort -2 sprays each nostril one time per day  C.  Alvesco 160  -1 inhalation twice a day with spacer  3.  Treat and prevent reflux:   A.  Minimize all caffeine and chocolate consumption  B.  Omeprazole 40 mg - 1 tablet in a.m.  C.  Famotidine 40 mg - 1 tablet in p.m.  4.  If needed:   A. Albuterol HFA - 2 inhalations every 4-6 hours  B. OTC antihistamine  5.  Return to clinic in 3 weeks or earlier if problem  6.  Sinus CT scan???  Blood test???  It sounds as though Carolyn Lynn has a inflamed and irritated respiratory tract and the exact etiologic factor responsible for that issue is not entirely clear.  We will treat her with anti-inflammatory agents for airway inflammation and aggressive therapy directed against LPR and hold off on imaging her upper airway in investigation of a chronic sinus infection at this point in time until we can see her back in this clinic in 3 weeks to assess her response to the treatment noted above.  Jiles Prows, MD Allergy / Immunology [facility]

## 2019-07-15 ENCOUNTER — Encounter: Payer: Self-pay | Admitting: Allergy and Immunology

## 2019-07-15 MED ORDER — ALBUTEROL SULFATE HFA 108 (90 BASE) MCG/ACT IN AERS: INHALATION_SPRAY | RESPIRATORY_TRACT | 1 refills | Status: DC

## 2019-07-15 MED ORDER — OMEPRAZOLE 40 MG PO CPDR: 40.0000 mg | DELAYED_RELEASE_CAPSULE | Freq: Every day | ORAL | 5 refills | Status: DC

## 2019-07-15 MED ORDER — FAMOTIDINE 40 MG PO TABS: 40.0000 mg | ORAL_TABLET | Freq: Every day | ORAL | 5 refills | Status: DC

## 2019-07-15 MED ORDER — ALVESCO 160 MCG/ACT IN AERS: 1.0000 | INHALATION_SPRAY | Freq: Two times a day (BID) | RESPIRATORY_TRACT | 0 refills | Status: DC

## 2019-07-16 NOTE — Addendum Note (Signed)
Addended by: Deborra Medina on: 07/16/2019 06:26 PM   Modules accepted: Orders

## 2019-07-16 NOTE — Addendum Note (Signed)
Addended by: Deborra Medina on: 07/16/2019 04:51 PM   Modules accepted: Orders

## 2019-07-17 ENCOUNTER — Telehealth: Payer: Self-pay

## 2019-07-17 NOTE — Telephone Encounter (Signed)
Pa submitted via  tracks for alvesco 160

## 2019-07-23 ENCOUNTER — Emergency Department (HOSPITAL_COMMUNITY): Payer: Self-pay

## 2019-07-23 ENCOUNTER — Emergency Department (HOSPITAL_COMMUNITY)
Admission: EM | Admit: 2019-07-23 | Discharge: 2019-07-23 | Disposition: A | Discharge status: Home / Self Care | Payer: Self-pay | Attending: Emergency Medicine | Admitting: Emergency Medicine

## 2019-07-23 ENCOUNTER — Encounter (HOSPITAL_COMMUNITY): Payer: Self-pay | Admitting: Emergency Medicine

## 2019-07-23 ENCOUNTER — Other Ambulatory Visit: Payer: Self-pay

## 2019-07-23 DIAGNOSIS — N83202 Unspecified ovarian cyst, left side: Secondary | ICD-10-CM | POA: Insufficient documentation

## 2019-07-23 DIAGNOSIS — R102 Pelvic and perineal pain: Secondary | ICD-10-CM | POA: Insufficient documentation

## 2019-07-23 DIAGNOSIS — R1900 Intra-abdominal and pelvic swelling, mass and lump, unspecified site: Secondary | ICD-10-CM

## 2019-07-23 DIAGNOSIS — I5042 Chronic combined systolic (congestive) and diastolic (congestive) heart failure: Secondary | ICD-10-CM | POA: Insufficient documentation

## 2019-07-23 DIAGNOSIS — Z79899 Other long term (current) drug therapy: Secondary | ICD-10-CM | POA: Insufficient documentation

## 2019-07-23 DIAGNOSIS — R109 Unspecified abdominal pain: Secondary | ICD-10-CM

## 2019-07-23 DIAGNOSIS — N83201 Unspecified ovarian cyst, right side: Secondary | ICD-10-CM | POA: Insufficient documentation

## 2019-07-23 LAB — URINALYSIS, ROUTINE W REFLEX MICROSCOPIC
Bacteria, UA: NONE SEEN
Bilirubin Urine: NEGATIVE
Glucose, UA: NEGATIVE mg/dL
Hgb urine dipstick: NEGATIVE
Ketones, ur: NEGATIVE mg/dL
Nitrite: NEGATIVE
Protein, ur: NEGATIVE mg/dL
Specific Gravity, Urine: 1.02 (ref 1.005–1.030)
pH: 7 (ref 5.0–8.0)

## 2019-07-23 LAB — CBC
HCT: 39.7 % (ref 36.0–46.0)
Hemoglobin: 11.9 g/dL — ABNORMAL LOW (ref 12.0–15.0)
MCH: 24.6 pg — ABNORMAL LOW (ref 26.0–34.0)
MCHC: 30 g/dL (ref 30.0–36.0)
MCV: 82.2 fL (ref 80.0–100.0)
Platelets: 157 10*3/uL (ref 150–400)
RBC: 4.83 MIL/uL (ref 3.87–5.11)
RDW: 12.6 % (ref 11.5–15.5)
WBC: 6 10*3/uL (ref 4.0–10.5)
nRBC: 0 % (ref 0.0–0.2)

## 2019-07-23 LAB — BASIC METABOLIC PANEL
Anion gap: 9 (ref 5–15)
BUN: 10 mg/dL (ref 6–20)
CO2: 22 mmol/L (ref 22–32)
Calcium: 8.8 mg/dL — ABNORMAL LOW (ref 8.9–10.3)
Chloride: 105 mmol/L (ref 98–111)
Creatinine, Ser: 0.89 mg/dL (ref 0.44–1.00)
GFR calc Af Amer: >60 mL/min (ref >60)
GFR calc non Af Amer: >60 mL/min (ref >60)
Glucose, Bld: 104 mg/dL — ABNORMAL HIGH (ref 70–99)
Potassium: 3.7 mmol/L (ref 3.5–5.1)
Sodium: 136 mmol/L (ref 135–145)

## 2019-07-23 LAB — HEPATIC FUNCTION PANEL
ALT: 12 U/L (ref 0–44)
AST: 16 U/L (ref 15–41)
Albumin: 3.8 g/dL (ref 3.5–5.0)
Alkaline Phosphatase: 32 U/L — ABNORMAL LOW (ref 38–126)
Bilirubin, Direct: 0.1 mg/dL (ref 0.0–0.2)
Indirect Bilirubin: 0.7 mg/dL (ref 0.3–0.9)
Total Bilirubin: 0.8 mg/dL (ref 0.3–1.2)
Total Protein: 7 g/dL (ref 6.5–8.1)

## 2019-07-23 LAB — I-STAT BETA HCG BLOOD, ED (MC, WL, AP ONLY): I-stat hCG, quantitative: <5 m[IU]/mL (ref <5)

## 2019-07-23 LAB — LIPASE, BLOOD: Lipase: 27 U/L (ref 11–51)

## 2019-07-23 MED ORDER — SODIUM CHLORIDE 0.9 % IV BOLUS
500.0000 mL | Freq: Once | INTRAVENOUS | Status: AC
  Administered 2019-07-23: 500 mL via INTRAVENOUS

## 2019-07-23 MED ORDER — KETOROLAC TROMETHAMINE 30 MG/ML IJ SOLN
30.0000 mg | Freq: Once | INTRAMUSCULAR | Status: AC
  Administered 2019-07-23: 30 mg via INTRAVENOUS
  Filled 2019-07-23: qty 1

## 2019-07-23 NOTE — ED Notes (Signed)
Patient given discharge instructions. Questions were answered. Patient verbalized understanding of discharge instructions and care at home.  

## 2019-07-23 NOTE — Discharge Instructions (Addendum)
Your ultrasound showed bilateral ovarian cysts - right greater than left. It is recommended that you have a repeat ultrasound in 3-6 months to ensure resolution. Please follow up with [facility] for further evaluation.   I would recommend Tylenol as needed for pain as well as heating pads.   Return to the ED for any worsening symptoms

## 2019-07-23 NOTE — ED Triage Notes (Signed)
Pt presents to ED POv. Pt states has been having bilateral flank pain x3d. Pt states she has "pressure" w/ urination.

## 2019-07-23 NOTE — ED Provider Notes (Signed)
[facility] EMERGENCY DEPARTMENT Provider Note   CSN: [id number] Arrival date & time: 07/23/19  0536     History Chief Complaint  Patient presents with  . Flank Pain    Rosita KeaJervita J Groleau is a 34 y.o. 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 Pt has not taken anything for pain. Reports it is getting worse, prompting her to come to the ED today. Per triage report pt complained of "pressure" with urination however denies this currently. Pt denies fevers, chills, nausea, vomiting, diarrhea, constipation, dysuria, urinary frequency, urgency, or any other associated symptoms.   Pt reports similar symptoms in the past however she cannot recall what was wrong - she reports she was admitted in 2017 for same. Per chart review - pt admitted to the hospital on 10/31-11/14/2017 after vaginal delivery on 10/01. She was found to be tachycardic and hypotensive with CT scan and abd ultrasound showing bilateral hydrosalpinx, left hydronephrosis, hepatosplenomegaly and a small amount of ascites. She was found to have a periaortic adenopathy s/2 postpartum cardiomyopathy, hgb of 4.9, and an AKI with urine culture growing klebsiella - treated with vanc and zosyn and then switched to ceftriaxone.   The history is provided by the patient and medical records.       Past Medical History:  Diagnosis Date  . Anemia   . Chronic combined systolic and diastolic heart failure (HCC) 03/29/2016  . Chronic kidney disease    left kidney smaller than right  . History of blood transfusion 12/2015  . Medical history non-contributory     Patient Active Problem List   Diagnosis Date Noted  . Oral contraception initial prescription 06/21/2016  . Left tubo-ovarian abscess 05/03/2016  . Chronic combined systolic and diastolic heart failure (HCC)  40/98/119102/15/2018  . Hydronephrosis, left   . Peripartum cardiomyopathy   . Hepatosplenomegaly 12/14/2015  . Anemia, iron deficiency 12/14/2015    Past Surgical History:  Procedure Laterality Date  . LAPAROSCOPY N/A 05/03/2016   Procedure: LAPAROSCOPY DIAGNOSTIC;  Surgeon: Adam PhenixJames G Arnold, MD;  Location: [facility];  Service: Gynecology;  Laterality: N/A;  . LAPAROTOMY N/A 05/03/2016   Procedure: LAPAROTOMY;  Surgeon: Adam PhenixJames G Arnold, MD;  Location: [facility];  Service: Gynecology;  Laterality: N/A;  . NO PAST SURGERIES    . SALPINGOOPHORECTOMY Left 05/03/2016   Procedure: SALPINGO OOPHORECTOMY;  Surgeon: Adam PhenixJames G Arnold, MD;  Location: [facility];  Service: Gynecology;  Laterality: Left;     OB History    Gravida  1   Para  1   Term  1   Preterm      AB      Living  1     SAB      TAB      Ectopic      Multiple  0   Live Births  1           Family History  Problem Relation Age of Onset  . Hypertension Mother   . Asthma Brother   . Cancer Sister     Social History   Tobacco Use  . Smoking status: Never Smoker  . Smokeless tobacco: Never Used  Vaping Use  . Vaping Use: Never used  Substance Use Topics  . Alcohol use: No  . Drug use: No    Home Medications Prior to Admission  medications   Medication Sig Start Date End Date Taking? Authorizing Provider  ciclesonide (ALVESCO) 160 MCG/ACT inhaler Inhale 1 puff into the lungs 2 (two) times daily. With spacer 07/15/19  Yes Kozlow, Donnamarie Poag, MD  dextromethorphan (DELSYM) 30 MG/5ML liquid Take 30 mg by mouth as needed for cough.    Yes [provider]  famotidine (PEPCID) 40 MG tablet Take 1 tablet (40 mg total) by mouth daily. Patient taking differently: Take 40 mg by mouth as needed for heartburn.  07/15/19  Yes Kozlow, Donnamarie Poag, MD  omeprazole (PRILOSEC) 40 MG capsule Take 1 capsule (40 mg total) by mouth daily. 07/15/19  Yes Kozlow, Donnamarie Poag, MD  PREDNISONE PO Take 1 tablet by mouth daily. Take for 10 days starting 07/14/19    Yes [provider]  albuterol (VENTOLIN HFA) 108 (90 Base) MCG/ACT inhaler 2 inhalations every 4-6 hours Patient not taking: Reported on 07/23/2019 07/15/19   Kozlow, Donnamarie Poag, MD  benzonatate (TESSALON PERLES) 100 MG capsule Take 1 capsule (100 mg total) by mouth 3 (three) times daily as needed. Patient not taking: Reported on 07/23/2019 05/05/19   Chevis Pretty, FNP  cetirizine (ZYRTEC) 10 MG tablet Take 1 tablet (10 mg total) by mouth daily. Patient not taking: Reported on 07/14/2019 06/05/19   Fulp, Cammie, MD  ipratropium (ATROVENT) 0.03 % nasal spray Place 2 sprays into both nostrils every 12 (twelve) hours. Patient not taking: Reported on 07/23/2019 06/05/19   Antony Blackbird, MD  promethazine-dextromethorphan (PROMETHAZINE-DM) 6.25-15 MG/5ML syrup Take 5 mLs by mouth 4 (four) times daily as needed for cough. Patient not taking: Reported on 07/14/2019 02/28/19   Kinnie Feil, PA-C    Allergies    Patient has no known allergies.  Review of Systems   Review of Systems  Constitutional: Negative for chills and fever.  Gastrointestinal: Positive for abdominal pain. Negative for blood in stool, constipation, diarrhea, nausea and vomiting.  Genitourinary: Positive for flank pain. Negative for difficulty urinating, dysuria, frequency, pelvic pain, urgency and vaginal discharge.  All other systems reviewed and are negative.   Physical Exam Updated Vital Signs BP 113/79 (BP Location: Right Arm)   Pulse 99   Temp 99.5 F (37.5 C) (Oral)   Resp 18   Ht 5\' 4"  (1.626 m)   Wt 73.9 kg   SpO2 97%   BMI 27.98 kg/m   Physical Exam Vitals and nursing note reviewed.  Constitutional:      Appearance: She is not ill-appearing or diaphoretic.  HENT:     Head: Normocephalic and atraumatic.  Eyes:     Conjunctiva/sclera: Conjunctivae normal.  Cardiovascular:     Rate and Rhythm: Normal rate and regular rhythm.     Pulses: Normal pulses.  Pulmonary:     Effort: Pulmonary effort is  normal.     Breath sounds: Normal breath sounds. No wheezing, rhonchi or rales.  Abdominal:     Palpations: Abdomen is soft.     Tenderness: There is abdominal tenderness. There is left CVA tenderness. There is no guarding or rebound.     Comments: Soft, + left CVA TTP as well as left flank tenderness to palpation, +BS throughout, no r/g/r, neg murphy's, neg mcburney's  Musculoskeletal:     Cervical back: Neck supple.  Skin:    General: Skin is warm and dry.  Neurological:     Mental Status: She is alert.     ED Results / Procedures / Treatments   Labs (all labs ordered are  listed, but only abnormal results are displayed) Labs Reviewed  URINALYSIS, ROUTINE W REFLEX MICROSCOPIC - Abnormal; Notable for the following components:      Result Value   APPearance HAZY (*)    Leukocytes,Ua MODERATE (*)    All other components within normal limits  BASIC METABOLIC PANEL - Abnormal; Notable for the following components:   Glucose, Bld 104 (*)    Calcium 8.8 (*)    All other components within normal limits  CBC - Abnormal; Notable for the following components:   Hemoglobin 11.9 (*)    MCH 24.6 (*)    All other components within normal limits  HEPATIC FUNCTION PANEL - Abnormal; Notable for the following components:   Alkaline Phosphatase 32 (*)    All other components within normal limits  LIPASE, BLOOD  I-STAT BETA HCG BLOOD, ED ([facility], [facility], [facility] ONLY)    EKG None  Radiology CT Renal Stone Study  Result Date: 07/23/2019 CLINICAL DATA:  34 year old female with history of left-sided flank pain. Suspected kidney stone. EXAM: CT ABDOMEN AND PELVIS WITHOUT CONTRAST TECHNIQUE: Multidetector CT imaging of the abdomen and pelvis was performed following the standard protocol without IV contrast. COMPARISON:  CT of the abdomen and pelvis 12/25/2015. FINDINGS: Lower chest: 3 mm right lower lobe pulmonary nodule (axial image 4 of series 5), and 3 mm left lower lobe pulmonary nodule in the left lower  lobe (axial image 8 of series 5), nonspecific. Hepatobiliary: A 1.6 cm low-attenuation lesion in segment 7 of the liver, incompletely characterized on today's non-contrast CT examination but similar to the prior study from 2017, presumably a cyst. No other definite suspicious hepatic lesions are confidently identified on today's noncontrast CT examination. Unenhanced appearance of the gallbladder is normal. Pancreas: No definite pancreatic mass or peripancreatic fluid collections or inflammatory changes are noted on today's noncontrast CT examination. Spleen: Unremarkable. Adrenals/Urinary Tract: No calcifications are identified within the collecting system of either kidney, along the course of either ureter, or within the lumen of the urinary bladder. No hydroureteronephrosis. Mild atrophy of the left kidney. Unenhanced appearance of the right kidney is normal. Bilateral adrenal glands are normal in appearance. Urinary bladder is normal in appearance. Stomach/Bowel: Unenhanced appearance of the stomach is normal. No pathologic dilatation of small bowel or colon. Normal appendix. Vascular/Lymphatic: No atherosclerotic calcifications in the abdominal aorta or pelvic vasculature. No lymphadenopathy noted in the abdomen or pelvis. Reproductive: Extending off the posterior aspect of the uterine body there is a large lesion measuring approximately 4.7 x 4.6 x 5.0 cm (axial image 65 of series 3 and sagittal image 48 of series 7), likely to represent an ovarian fibroid, although this is also intimately associated with the posterior aspect of the right adnexal region raising the possibility of an ovarian lesion. Unenhanced appearance of the left ovary is unremarkable. Other: No significant volume of ascites.  No pneumoperitoneum. Musculoskeletal: There are no aggressive appearing lytic or blastic lesions noted in the visualized portions of the skeleton. IMPRESSION: 1. No acute findings are noted in the abdomen or pelvis.  Specifically, no urinary tract calculi no findings of urinary tract obstruction. 2. Atrophy of the left kidney. 3. Indeterminate pelvic lesion measuring approximately 4.7 x 4.6 x 5.0 cm. This could represent an exophytic uterine fibroid, however, the possibility of a primary ovarian mass is not excluded. Further evaluation with pelvic ultrasound is recommended to better characterize this finding. Electronically Signed   By: Trudie Reed M.D.   On: 07/23/2019 08:12  US PELVIC COMPLETE W TRANSVAGINAL AND TORSION R/O  Result Date: 07/23/2019 CLINICAL DATA:  Pelvic mass on CT EXAM: TRANSABDOMINAL AND TRANSVAGINAL ULTRASOUND OF PELVIS DOPPLER ULTRASOUND OF OVARIES TECHNIQUE: Both transabdominal and transvaginal ultrasound examinations of the pelvis were performed. Transabdominal technique was performed for global imaging of the pelvis including uterus, ovaries, adnexal regions, and pelvic cul-de-sac. It was necessary to proceed with endovaginal exam following the transabdominal exam to visualize the ovaries/adnexa. Color and duplex Doppler ultrasound was utilized to evaluate blood flow to the ovaries. COMPARISON:  CT earlier today FINDINGS: Uterus Measurements: 11.1 x 4.7 x 6.0 cm = volume: 171 mL. Her retroverted. Heterogeneous echotexture without focal mass. Endometrium Thickness: 14 mm in thickness.  No focal abnormality visualized. Right ovary Measurements: 6.9 x 3.9 x 5.4 cm = volume: 76 mL. Complex cystic areas within the right ovary, the largest measuring 4.5 cm. Adjacent 2.8 cm complex cystic area. Findings likely reflect hemorrhagic cysts. Left ovary Measurements: 2.6 x 1.9 x 2.7 cm = volume: 6.9 mL. 2.5 cm complex cystic area with thin internal septations. Pulsed Doppler evaluation of both ovaries demonstrates normal low-resistance arterial and venous waveforms. Other findings No abnormal free fluid. IMPRESSION: Bilateral complex cystic structures within the ovaries, right larger than left. Complex  cystic area in the right ovary measures up to 4.5 cm. These likely reflect hemorrhagic cysts. These could be followed with repeat ultrasound in 3-6 months to ensure resolution. No evidence of ovarian torsion. Electronically Signed   By: Charlett Nose M.D.   On: 07/23/2019 10:41    Procedures Procedures (including critical care time)  Medications Ordered in ED Medications  sodium chloride 0.9 % bolus 500 mL (0 mLs Intravenous Stopped 07/23/19 0819)  ketorolac (TORADOL) 30 MG/ML injection 30 mg (30 mg Intravenous Given 07/23/19 0719)    ED Course  I have reviewed the triage vital signs and the nursing notes.  Pertinent labs & imaging results that were available during my care of the patient were reviewed by me and considered in my medical decision making (see chart for details).    MDM Rules/Calculators/A&P                          34 year old female presents to the ED today complaining of left flank pain radiating to abdomen the left side x3 to 4 days.  No other associated symptoms.  On arrival to the ED patient is afebrile, nontachycardic and nontachypneic.  She is noted to have left CVA tenderness as well as left flank tenderness.  No history of kidney stones.  She does report similar symptoms in 2017 she was noted to have left hydronephrosis secondary to bilateral hydrosalpinx.  Denies any pelvic pain or vaginal discharge at this time. LNMP 2-3 weeks ago. Labwork was obtained prior to being seen including - U/A with moderate leuks however 0-5 RBCs and WBCs, 6-10 squamous epithelial. Suspect contamination. CBC without leukocytosis. Hgb stable. BMP without electrolyte abnormalities. Given pain radiates into abdomen have added on LFTs and lipase. Will obtain CT renal stone study at this time to further assess. Toradol for pain.   CT renal stone with findings of atrophy of the left kidney as well as indeterminate pelvic lesion measuring 4.7 x 5.0 cm. Recommend ultrasound for further evaluation.  Will add on at this time. It appears pt had nuclear medicine renal scan on 03/28/2016 for eval of left hydronephrosis with findings of diminished and slightly delayed flow to a small L  kidney.   Ultrasound with findings consistent with bilateral ovarian cysts; for hemorrhagic cysts.  Patient states last normal menstrual period 2 to 3 weeks ago, not currently bleeding.  Denies heavy vaginal bleeding.  This could very well be causing patient's pain given no findings of kidney stones or other acute findings. Pt continues to rest comfortably.  Will discharge home at this time with close OBGYN follow up. Recommend Tylenol PRN for pain as well as heating pads. Pt is in agreement with plan and stable for discharge home.   This note was prepared using Dragon voice recognition software and may include unintentional dictation errors due to the inherent limitations of voice recognition software.  Final Clinical Impression(s) / ED Diagnoses Final diagnoses:  Cysts of both ovaries  Flank pain    Rx / DC Orders ED Discharge Orders    None       Discharge Instructions     Your ultrasound showed bilateral ovarian cysts - right greater than left. It is recommended that you have a repeat ultrasound in 3-6 months to ensure resolution. Please follow up with [facility] for further evaluation.   I would recommend Tylenol as needed for pain as well as heating pads.   Return to the ED for any worsening symptoms       Tanda Rockers, Cordelia Poche 07/23/19 1106    Eber Hong, MD 07/24/19 [phone]

## 2019-08-04 ENCOUNTER — Ambulatory Visit: Payer: Self-pay | Admitting: Allergy and Immunology

## 2019-08-13 ENCOUNTER — Other Ambulatory Visit: Payer: Self-pay

## 2019-08-13 ENCOUNTER — Encounter: Payer: Self-pay | Admitting: Obstetrics & Gynecology

## 2019-08-13 ENCOUNTER — Ambulatory Visit (INDEPENDENT_AMBULATORY_CARE_PROVIDER_SITE_OTHER): Payer: Self-pay | Admitting: Obstetrics & Gynecology

## 2019-08-13 VITALS — BP 130/89 | HR 83 | Ht 64.0 in | Wt 167.0 lb

## 2019-08-13 DIAGNOSIS — N83201 Unspecified ovarian cyst, right side: Secondary | ICD-10-CM

## 2019-08-13 NOTE — Patient Instructions (Signed)
Ovarian Cyst An ovarian cyst is a fluid-filled sac on an ovary. The ovaries are organs that make eggs in women. Most ovarian cysts go away on their own and are not cancerous (are benign). Some cysts need treatment. Follow these instructions at home:  Take over-the-counter and prescription medicines only as told by your doctor.  Do not drive or use heavy machinery while taking prescription pain medicine.  Get pelvic exams and Pap tests as often as told by your doctor.  Return to your normal activities as told by your doctor. Ask your doctor what activities are safe for you.  Do not use any products that contain nicotine or tobacco, such as cigarettes and e-cigarettes. If you need help quitting, ask your doctor.  Keep all follow-up visits as told by your doctor. This is important. Contact a doctor if:  Your periods are: ? Late. ? Irregular. ? Painful.   Your periods stop.  You have pelvic pain that does not go away.  You have pressure on your bladder.  You have trouble making your bladder empty when you pee (urinate).  You have pain during sex.  You have any of the following in your belly (abdomen): ? A feeling of fullness. ? Pressure. ? Discomfort. ? Pain that does not go away. ? Swelling.  You feel sick most of the time.  You have trouble pooping (have constipation).  You are not as hungry as usual (you lose your appetite).  You get very bad acne.  You start to have more hair on your body and face.  You are gaining weight or losing weight without changing your exercise and eating habits.  You think you may be pregnant. Get help right away if:  You have belly pain that is very bad or gets worse.  You cannot eat or drink without throwing up (vomiting).  You suddenly get a fever.  Your period is a lot heavier than usual. This information is not intended to replace advice given to you by your health care provider. Make sure you discuss any questions you have  with your health care provider. Document Revised: 01/11/2017 Document Reviewed: 07/03/2015 Elsevier Patient Education  2020 Elsevier Inc.  

## 2019-08-13 NOTE — Progress Notes (Signed)
Patient ID: Carolyn Lynn, female   DOB: 08/28/1985, 34 y.o.   MRN: [id number]  Chief Complaint  Patient presents with  . Follow-up  ED f/u 07/23/19  HPI Carolyn Lynn is a 34 y.o. female.  G1P1001 Patient's last menstrual period was 07/30/2019. She was seen in ED for pelvic pain and is referred due to right pelvic cystic mass. Her pain resolved after 4 days. Menses are regular. S/p LSO for TOA 2018 HPI  Past Medical History:  Diagnosis Date  . Anemia   . Chronic combined systolic and diastolic heart failure (HCC) 03/29/2016  . Chronic kidney disease    left kidney smaller than right  . History of blood transfusion 12/2015  . Medical history non-contributory     Past Surgical History:  Procedure Laterality Date  . LAPAROSCOPY N/A 05/03/2016   Procedure: LAPAROSCOPY DIAGNOSTIC;  Surgeon: Adam Phenix, MD;  Location: [facility];  Service: Gynecology;  Laterality: N/A;  . LAPAROTOMY N/A 05/03/2016   Procedure: LAPAROTOMY;  Surgeon: Adam Phenix, MD;  Location: [facility];  Service: Gynecology;  Laterality: N/A;  . NO PAST SURGERIES    . SALPINGOOPHORECTOMY Left 05/03/2016   Procedure: SALPINGO OOPHORECTOMY;  Surgeon: Adam Phenix, MD;  Location: [facility];  Service: Gynecology;  Laterality: Left;    Family History  Problem Relation Age of Onset  . Hypertension Mother   . Asthma Brother   . Cancer Sister     Social History Social History   Tobacco Use  . Smoking status: Never Smoker  . Smokeless tobacco: Never Used  Vaping Use  . Vaping Use: Never used  Substance Use Topics  . Alcohol use: No  . Drug use: No    No Known Allergies  Current Outpatient Medications  Medication Sig Dispense Refill  . albuterol (VENTOLIN HFA) 108 (90 Base) MCG/ACT inhaler 2 inhalations every 4-6 hours (Patient not taking: Reported on 07/23/2019) 18 g 1  . benzonatate (TESSALON PERLES) 100 MG capsule Take 1 capsule (100 mg total) by mouth 3 (three) times daily as needed. (Patient not taking:  Reported on 07/23/2019) 20 capsule 0  . cetirizine (ZYRTEC) 10 MG tablet Take 1 tablet (10 mg total) by mouth daily. (Patient not taking: Reported on 07/14/2019) 30 tablet 11  . ciclesonide (ALVESCO) 160 MCG/ACT inhaler Inhale 1 puff into the lungs 2 (two) times daily. With spacer (Patient not taking: Reported on 08/13/2019) 1 Inhaler 0  . dextromethorphan (DELSYM) 30 MG/5ML liquid Take 30 mg by mouth as needed for cough.     . famotidine (PEPCID) 40 MG tablet Take 1 tablet (40 mg total) by mouth daily. (Patient not taking: Reported on 08/13/2019) 30 tablet 5  . ipratropium (ATROVENT) 0.03 % nasal spray Place 2 sprays into both nostrils every 12 (twelve) hours. (Patient not taking: Reported on 07/23/2019) 30 mL 12  . omeprazole (PRILOSEC) 40 MG capsule Take 1 capsule (40 mg total) by mouth daily. (Patient not taking: Reported on 08/13/2019) 30 capsule 5  . PREDNISONE PO Take 1 tablet by mouth daily. Take for 10 days starting 07/14/19 (Patient not taking: Reported on 08/13/2019)    . promethazine-dextromethorphan (PROMETHAZINE-DM) 6.25-15 MG/5ML syrup Take 5 mLs by mouth 4 (four) times daily as needed for cough. (Patient not taking: Reported on 07/14/2019) 118 mL 0   No current facility-administered medications for this visit.    Review of Systems Review of Systems  Constitutional: Negative.   Gastrointestinal: Negative.   Genitourinary: Negative for menstrual problem, pelvic  pain, vaginal bleeding and vaginal discharge.    Blood pressure 130/89, pulse 83, height 5\' 4"  (1.626 m), weight 167 lb (75.8 kg), last menstrual period 07/30/2019.  Physical Exam Physical Exam Vitals and nursing note reviewed.  Constitutional:      Appearance: Normal appearance. She is not ill-appearing.  Pulmonary:     Effort: Pulmonary effort is normal.  Abdominal:     General: Abdomen is flat. There is no distension.     Palpations: Abdomen is soft.     Tenderness: There is no abdominal tenderness.  Skin:    General: Skin  is warm and dry.  Neurological:     Mental Status: She is alert.  Psychiatric:        Mood and Affect: Mood normal.        Behavior: Behavior normal.     Data Reviewed CLINICAL DATA:  Pelvic mass on CT  EXAM: TRANSABDOMINAL AND TRANSVAGINAL ULTRASOUND OF PELVIS  DOPPLER ULTRASOUND OF OVARIES  TECHNIQUE: Both transabdominal and transvaginal ultrasound examinations of the pelvis were performed. Transabdominal technique was performed for global imaging of the pelvis including uterus, ovaries, adnexal regions, and pelvic cul-de-sac.  It was necessary to proceed with endovaginal exam following the transabdominal exam to visualize the ovaries/adnexa. Color and duplex Doppler ultrasound was utilized to evaluate blood flow to the ovaries.  COMPARISON:  CT earlier today  FINDINGS: Uterus  Measurements: 11.1 x 4.7 x 6.0 cm = volume: 171 mL. Her retroverted. Heterogeneous echotexture without focal mass.  Endometrium  Thickness: 14 mm in thickness.  No focal abnormality visualized.  Right ovary  Measurements: 6.9 x 3.9 x 5.4 cm = volume: 76 mL. Complex cystic areas within the right ovary, the largest measuring 4.5 cm. Adjacent 2.8 cm complex cystic area. Findings likely reflect hemorrhagic cysts.  Left ovary  Measurements: 2.6 x 1.9 x 2.7 cm = volume: 6.9 mL. 2.5 cm complex cystic area with thin internal septations.  Pulsed Doppler evaluation of both ovaries demonstrates normal low-resistance arterial and venous waveforms.  Other findings  No abnormal free fluid.  IMPRESSION: Bilateral complex cystic structures within the ovaries, right larger than left. Complex cystic area in the right ovary measures up to 4.5 cm. These likely reflect hemorrhagic cysts. These could be followed with repeat ultrasound in 3-6 months to ensure resolution.  No evidence of ovarian torsion.   Electronically Signed   By: 08/01/2019 M.D.   On: 07/23/2019  10:41  Assessment Right ovarian cyst - Plan: 09/22/2019 PELVIC COMPLETE WITH TRANSVAGINAL Possible s/p hemorrhagic cyst   Plan F/u Carolyn Lynn as recommended for resolution Needs pap, annual examination    Carolyn Lynn 08/13/2019, 11:04 AM

## 2019-08-27 ENCOUNTER — Other Ambulatory Visit: Payer: Self-pay | Admitting: Obstetrics & Gynecology

## 2019-08-27 ENCOUNTER — Ambulatory Visit
Admission: RE | Admit: 2019-08-27 | Discharge: 2019-08-27 | Disposition: A | Discharge status: Home / Self Care | Payer: Medicaid Other | Source: Ambulatory Visit | Attending: Obstetrics & Gynecology | Admitting: Obstetrics & Gynecology

## 2019-08-27 ENCOUNTER — Other Ambulatory Visit: Payer: Self-pay

## 2019-08-27 DIAGNOSIS — N83201 Unspecified ovarian cyst, right side: Secondary | ICD-10-CM

## 2019-09-02 ENCOUNTER — Other Ambulatory Visit: Payer: Self-pay

## 2019-09-02 DIAGNOSIS — N83201 Unspecified ovarian cyst, right side: Secondary | ICD-10-CM

## 2019-09-02 NOTE — Progress Notes (Signed)
[place] order put in per Dr. Debroah Loop

## 2019-09-07 ENCOUNTER — Telehealth (INDEPENDENT_AMBULATORY_CARE_PROVIDER_SITE_OTHER): Payer: Self-pay | Admitting: Lactation Services

## 2019-09-07 DIAGNOSIS — N83201 Unspecified ovarian cyst, right side: Secondary | ICD-10-CM

## 2019-09-07 NOTE — Telephone Encounter (Signed)
Called radiology to schedule Transvaginal  US non OB in 8-12 weeks.   [place] scheduled for 9/3 at 9 am. Patient to arrive at 08:45 with a full bladder.   Message to front desk to obtain Pre Auth.   Called patient to inform her of appt. And results. Patient voiced understanding.   patient reports she had intercourse with some light bleeding noted. Discussed this can be normal for some depending on where they are in their cycle. She is to let us know if it continues. She has no other signs or concerning symptoms.

## 2019-09-07 NOTE — Telephone Encounter (Signed)
-----   Message from Eldred Manges, RN sent at 09/02/2019  2:30 PM EDT -----  ----- Message ----- From: Almon Register D Sent: 09/02/2019   1:58 PM EDT To: Eldred Manges, RN    This is not our patient.  Carolyn Lynn ----- Message ----- From: Eldred Manges, RN Sent: 09/02/2019  12:00 PM EDT To: Donn[facility]   ----- Message ----- From: Adam Phenix, MD Sent: 09/02/2019  11:25 AM EDT To: [facility]  Please schedule f/u US in 8-12 weeks

## 2019-10-16 ENCOUNTER — Other Ambulatory Visit: Payer: Self-pay

## 2019-10-16 ENCOUNTER — Ambulatory Visit
Admission: RE | Admit: 2019-10-16 | Discharge: 2019-10-16 | Disposition: A | Discharge status: Home / Self Care | Payer: Medicaid Other | Source: Ambulatory Visit | Attending: Obstetrics & Gynecology | Admitting: Obstetrics & Gynecology

## 2019-10-16 DIAGNOSIS — N83201 Unspecified ovarian cyst, right side: Secondary | ICD-10-CM | POA: Insufficient documentation

## 2019-11-11 ENCOUNTER — Encounter: Payer: Self-pay | Admitting: Family Medicine

## 2019-12-24 ENCOUNTER — Telehealth: Payer: Medicaid Other | Admitting: Physician Assistant

## 2019-12-24 DIAGNOSIS — R059 Cough, unspecified: Secondary | ICD-10-CM

## 2019-12-24 NOTE — Progress Notes (Signed)
Hi Carolyn Lynn,   I am sorry you've had this cough for such a long time.  I can see where you had a visit with Dr. Jessica Priest at [facility].  Looks like he initiated treatment and asked you to come back 3 weeks later, however I am not seeing where you attended a follow-up appointment.  It is important to follow-up with your provider once you initiate treatment for a problem like this.  Unfortunately due to the longitudinality of this problem, this is too complex for an E-visit.  I recommend following up with the Allergy and Asthma center    Based on what you shared with me, I feel your condition warrants further evaluation and I recommend that you be seen for a face to face visit.  Please contact your primary care physician practice to be seen. Many offices offer virtual options to be seen via video if you are not comfortable going in person to a medical facility at this time.  If you do not have a PCP, [facility] offers a free physician referral service available at [phone]. Our trained staff has the experience, knowledge and resources to put you in touch with a physician who is right for you.   You also have the option of a video visit through [email or web address]  If you are having a true medical emergency please call 911.  NOTE: If you entered your credit card information for this eVisit, you will not be charged. You may see a "hold" on your card for the $35 but that hold will drop off and you will not have a charge processed.  Your e-visit answers were reviewed by a board certified advanced clinical practitioner to complete your personal care plan.  Thank you for using e-Visits.

## 2019-12-30 NOTE — Patient Instructions (Addendum)
Asthma Begin montelukast 10 mg once a day to prevent cough or wheeze  begin ArmonAir DigiHaler113-1 puff twice a day to prevent cough or wheeze Continue albuterol 2 puffs every 4 hours as needed for cough or wheeze  Rhinitis Begin Flonase 2 sprays in each nostril once a day for a stuffy nose Begin montelukast as listed above to help with allergy symptoms Consider saline nasal rinses as needed for nasal symptoms. Use this before any medicated nasal sprays for best result For thick post nasal drainage, begin Mucinex [phone] mg twice a day and increase fluid intake in order to this mucus.   Reflux Continue dietary and lifestyle modifications as listed below Begin omeprazole 40 mg once a day to control reflux  Secondhand smoke exposure Continue to avoid second hand smoke inhalation  Call the clinic if this treatment plan is not working well for you  Follow up in 3 months or sooner if needed.   Lifestyle Changes for Controlling GERD When you have GERD, stomach acid feels as if it's backing up toward your mouth. Whether or not you take medication to control your GERD, your symptoms can often be improved with lifestyle changes.   Raise Your Head  Reflux is more likely to strike when you're lying down flat, because stomach fluid can  flow backward more easily. Raising the head of your bed 4-6 inches can help. To do this:  Slide blocks or books under the legs at the head of your bed. Or, place a wedge under  the mattress. Many foam stores can make a suitable wedge for you. The wedge  should run from your waist to the top of your head.  Don't just prop your head on several pillows. This increases pressure on your  stomach. It can make GERD worse.  Watch Your Eating Habits Certain foods may increase the acid in your stomach or relax the lower esophageal sphincter, making GERD more likely. It's best to avoid the following:  Coffee, tea, and carbonated drinks (with and without  caffeine)  Fatty, fried, or spicy food  Mint, chocolate, onions, and tomatoes  Any other foods that seem to irritate your stomach or cause you pain  Relieve the Pressure  Eat smaller meals, even if you have to eat more often.  Don't lie down right after you eat. Wait a few hours for your stomach to empty.  Avoid tight belts and tight-fitting clothes.  Lose excess weight.  Tobacco and Alcohol  Avoid smoking tobacco and drinking alcohol. They can make GERD symptoms worse.

## 2019-12-30 NOTE — Progress Notes (Signed)
[address] [place] [address] Dept: [phone]  FOLLOW UP NOTE  Patient ID: Carolyn Lynn, female    DOB: 12-12-1985  Age: 34 y.o. MRN: [id number] Date of Office Visit: 12/31/2019  Assessment  Chief Complaint: Cough (SOB, gasp, gagging ) and Asthma  HPI Carolyn Lynn is a 34 year old female who presents to the clinic for follow-up visit.  She was last seen in this clinic on 07/14/2019 by Dr. Lucie Leather for evaluation of asthma rhinitis, LPR.  At today's visit she reports that her symptoms began in October 2020 and include cough which is mostly dry and occasionally produces some mucus, shortness of breath while coughing, wheezing, coughing, and posttussive vomiting 2 times over the last year.  She reports that she uses albuterol about 1-2 times a week with a mild amount of relief of cough.  Allergic rhinitis is reported as moderately well controlled with occasional sneeze and some postnasal drainage.  She is not currently using a nasal saline spray or steroid nasal spray.  She occasionally uses Benadryl which provides some relief.  Reflux is reported as well controlled with no heartburn at this time.  She is not taking omeprazole or famotidine.  Her current medications are listed in the chart.   Drug Allergies:  No Known Allergies  Physical Exam: BP 122/70    Pulse 82    Temp 98 F (36.7 C) (Oral)    Resp 18    Ht 5\' 4"  (1.626 m)    Wt 163 lb 12.8 oz (74.3 kg)    SpO2 98%    BMI 28.12 kg/m    Physical Exam Vitals reviewed.  Constitutional:      Appearance: Normal appearance.  HENT:     Head: Normocephalic and atraumatic.     Right Ear: Tympanic membrane normal.     Left Ear: Tympanic membrane normal.     Nose:     Comments: Bilateral nares edematous and pale with clear nasal drainage noted.  Pharynx is slightly erythematous with no exudate.  Ears normal.  Eyes normal. Eyes:     Conjunctiva/sclera: Conjunctivae normal.  Cardiovascular:     Rate and Rhythm: Normal  rate and regular rhythm.     Heart sounds: Normal heart sounds. No murmur heard.   Pulmonary:     Effort: Pulmonary effort is normal.     Breath sounds: Normal breath sounds.     Comments: Lungs clear to auscultation Musculoskeletal:        General: Normal range of motion.     Cervical back: Normal range of motion and neck supple.  Skin:    General: Skin is warm and dry.  Neurological:     Mental Status: She is alert and oriented to person, place, and time.  Psychiatric:        Mood and Affect: Mood normal.        Behavior: Behavior normal.        Thought Content: Thought content normal.        Judgment: Judgment normal.     Diagnostics: FVC 2.79, FEV1 2.12.  Predicted FVC 3.20, predicted FEV1 2.70.  Spirometry indicates normal ventilatory function.  Postbronchodilator spirometry FVC 2.66, FEV1 2.12.  Postbronchodilator spirometry indicates normal ventilatory function with no significant bronchodilator response.  Assessment and Plan: 1. Mild persistent asthma, unspecified whether complicated   2. Perennial allergic rhinitis   3. LPRD (laryngopharyngeal reflux disease)     Meds ordered this encounter  Medications   montelukast (SINGULAIR)  10 MG tablet    Sig: Take 1 tablet (10 mg total) by mouth at bedtime.    Dispense:  30 tablet    Refill:  5   Fluticasone Propionate,sensor, (ARMONAIR DIGIHALER) 113 MCG/ACT AEPB    Sig: Inhale 1 puff into the lungs 2 (two) times daily.    Dispense:  1 each    Refill:  5   omeprazole (PRILOSEC) 40 MG capsule    Sig: Take 1 capsule (40 mg total) by mouth daily.    Dispense:  30 capsule    Refill:  5    Patient Instructions  Asthma Begin montelukast 10 mg once a day to prevent cough or wheeze  begin ArmonAir DigiHaler113-1 puff twice a day to prevent cough or wheeze Continue albuterol 2 puffs every 4 hours as needed for cough or wheeze  Rhinitis Begin Flonase 2 sprays in each nostril once a day for a stuffy nose Begin  montelukast as listed above to help with allergy symptoms Consider saline nasal rinses as needed for nasal symptoms. Use this before any medicated nasal sprays for best result For thick post nasal drainage, begin Mucinex [phone] mg twice a day and increase fluid intake in order to this mucus.   Reflux Continue dietary and lifestyle modifications as listed below Begin omeprazole 40 mg once a day to control reflux  Secondhand smoke exposure Continue to avoid second hand smoke inhalation  Call the clinic if this treatment plan is not working well for you  Follow up in 3 months or sooner if needed.   Return in about 3 months (around 04/01/2020), or if symptoms worsen or fail to improve.    Thank you for the opportunity to care for this patient.  Please do not hesitate to contact me with questions.  Thermon Leyland, FNP [facility]

## 2019-12-31 ENCOUNTER — Encounter: Payer: Self-pay | Admitting: Family Medicine

## 2019-12-31 ENCOUNTER — Other Ambulatory Visit: Payer: Self-pay

## 2019-12-31 ENCOUNTER — Ambulatory Visit (INDEPENDENT_AMBULATORY_CARE_PROVIDER_SITE_OTHER): Payer: Self-pay | Admitting: Family Medicine

## 2019-12-31 VITALS — BP 122/70 | HR 82 | Temp 98.0°F | Resp 18 | Ht 64.0 in | Wt 163.8 lb

## 2019-12-31 DIAGNOSIS — J4530 Mild persistent asthma, uncomplicated: Secondary | ICD-10-CM | POA: Insufficient documentation

## 2019-12-31 DIAGNOSIS — J3089 Other allergic rhinitis: Secondary | ICD-10-CM

## 2019-12-31 DIAGNOSIS — K219 Gastro-esophageal reflux disease without esophagitis: Secondary | ICD-10-CM | POA: Insufficient documentation

## 2019-12-31 HISTORY — DX: Mild persistent asthma, uncomplicated: J45.30

## 2019-12-31 MED ORDER — MONTELUKAST SODIUM 10 MG PO TABS
10.0000 mg | ORAL_TABLET | Freq: Every day | ORAL | 5 refills | Status: DC
Start: 2019-12-31 — End: 2021-08-31

## 2019-12-31 MED ORDER — ARMONAIR DIGIHALER 113 MCG/ACT IN AEPB: 1.0000 | INHALATION_SPRAY | Freq: Two times a day (BID) | RESPIRATORY_TRACT | 5 refills | Status: DC

## 2019-12-31 MED ORDER — OMEPRAZOLE 40 MG PO CPDR
40.0000 mg | DELAYED_RELEASE_CAPSULE | Freq: Every day | ORAL | 5 refills | Status: DC
Start: 2019-12-31 — End: 2021-08-31

## 2020-02-01 ENCOUNTER — Other Ambulatory Visit: Payer: Self-pay | Admitting: Allergy and Immunology

## 2021-02-23 ENCOUNTER — Other Ambulatory Visit: Payer: Self-pay | Admitting: Allergy and Immunology

## 2021-08-31 ENCOUNTER — Ambulatory Visit (INDEPENDENT_AMBULATORY_CARE_PROVIDER_SITE_OTHER): Payer: Managed Care, Other (non HMO) | Admitting: Cardiovascular Disease

## 2021-08-31 ENCOUNTER — Encounter (HOSPITAL_BASED_OUTPATIENT_CLINIC_OR_DEPARTMENT_OTHER): Payer: Self-pay | Admitting: Cardiovascular Disease

## 2021-08-31 VITALS — BP 134/86 | HR 73 | Ht 64.0 in | Wt 167.5 lb

## 2021-08-31 DIAGNOSIS — I10 Essential (primary) hypertension: Secondary | ICD-10-CM | POA: Diagnosis not present

## 2021-08-31 DIAGNOSIS — O903 Peripartum cardiomyopathy: Secondary | ICD-10-CM

## 2021-08-31 DIAGNOSIS — I5042 Chronic combined systolic (congestive) and diastolic (congestive) heart failure: Secondary | ICD-10-CM

## 2021-08-31 HISTORY — DX: Essential (primary) hypertension: I10

## 2021-08-31 NOTE — Patient Instructions (Signed)
Medication Instructions:  Your physician recommends that you continue on your current medications as directed. Please refer to the Current Medication list given to you today.   *If you need a refill on your cardiac medications before your next appointment, please call your pharmacy*  Lab Work: NONE  Testing/Procedures: Your physician has requested that you have an echocardiogram. Echocardiography is a painless test that uses sound waves to create images of your heart. It provides your doctor with information about the size and shape of your heart and how well your heart's chambers and valves are working. This procedure takes approximately one hour. There are no restrictions for this procedure.  Follow-Up: At [facility], you and your health needs are our priority.  As part of our continuing mission to provide you with exceptional heart care, we have created designated Provider Care Teams.  These Care Teams include your primary Cardiologist (physician) and Advanced Practice Providers (APPs -  Physician Assistants and Nurse Practitioners) who all work together to provide you with the care you need, when you need it.  We recommend signing up for the patient portal called "MyChart".  Sign up information is provided on this After Visit Summary.  MyChart is used to connect with patients for Virtual Visits (Telemedicine).  Patients are able to view lab/test results, encounter notes, upcoming appointments, etc.  Non-urgent messages can be sent to your provider as well.   To learn more about what you can do with MyChart, go to [email or web address].    Your next appointment:   1-2 month(s)  The format for your next appointment:   In Person  Provider:   Gillian Shields, NP{   1 YEAR WITH DR [facility]  Other Instructions MONITOR YOUR BLOOD PRESSURE DAILY AND LOG. BRING YOUR READINGS AND MACHINE TO YOUR FOLLOW UP APPOINTMENT

## 2021-08-31 NOTE — Assessment & Plan Note (Signed)
Blood pressure is elevated both initially and on repeat.  She is going to check her blood pressures at home.  She is also to work on increasing her exercise and continue to limit sodium intake.  She be persistently hypertensive, given her desire for pregnancy would avoid ACE inhibitors or ARB's.  Would also avoid amlodipine if her LVEF remains reduced.  Consider labetalol.

## 2021-08-31 NOTE — Progress Notes (Signed)
Cardiology Office Note   Date:  08/31/2021   ID:  Carolyn Lynn, DOB March 01, 1985, MRN [id number]  PCP:  Pete Glatter, MD (Inactive)  Cardiologist:   Chilton Si, MD   No chief complaint on file.    History of Present Illness: Carolyn Lynn is a 36 y.o. 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  Carolyn Lynn delivered a healthy baby (Tory) 11/13/15.  She presented to the ED 10/31 with shortness of breath and lower extremity edema.  Echo revealed LVEF 40-45%.  She was noted to have mild left hydronephrosis and enlarged hydrosalpinges and a possible UPJ stone.  Urine cultures were positive for Klebsiella pneumonia.  During that hospitalization there was also concern for pneumonia.  The hospitalization was also complicated by large bilateral hematosalpinx.  She was noted to have profound anemia with a hemoglobin of 4.9.  She required 5 units of pRBCs.  Her anemia was felt to be both due to bleeding and DIC from sepsis. She had a follow up echo 03/20/16 that revealed LVEF 45-50% with grade 2 diastolic dysfunction. She was also noted to have bileaflet mitral valve prolapse with mild mitral regurgitation.    Carolyn Lynn underwent laparoscopy that was converted to a laparotomy and left salpingo-oophorectomy on 05/03/16.  She followed up with Corine Shelter, PA-C 03/2019 and had stopped her ACE inhibitor.  She was otherwise well.  Lately she has been feeling fine.  She was exercising but has not been doing this recently.  She denies any exertional chest pain or shortness of breath.  She has no lower extremity edema, orthopnea, or PND.  She reports that her diet is good and she limits her sodium intake.  She mostly cooks at home.  She does not check her blood pressures at home.  Her son is now 5 and going to kindergarten.  She is thinking about having another child but not sure yet.  Past Medical History:  Diagnosis Date    Anemia    Chronic combined systolic and diastolic heart failure (HCC) 03/29/2016   Chronic kidney disease    left kidney smaller than right   Essential hypertension 08/31/2021   History of blood transfusion 12/2015   Medical history non-contributory    Mild persistent asthma 12/31/2019    Past Surgical History:  Procedure Laterality Date   LAPAROSCOPY N/A 05/03/2016   Procedure: LAPAROSCOPY DIAGNOSTIC;  Surgeon: Adam Phenix, MD;  Location: [facility];  Service: Gynecology;  Laterality: N/A;   LAPAROTOMY N/A 05/03/2016   Procedure: LAPAROTOMY;  Surgeon: Adam Phenix, MD;  Location: [facility];  Service: Gynecology;  Laterality: N/A;   NO PAST SURGERIES     SALPINGOOPHORECTOMY Left 05/03/2016   Procedure: SALPINGO OOPHORECTOMY;  Surgeon: Adam Phenix, MD;  Location: [facility];  Service: Gynecology;  Laterality: Left;     Current Outpatient Medications  Medication Sig Dispense Refill   albuterol (VENTOLIN HFA) 108 (90 Base) MCG/ACT inhaler INHALE 2 INHALATIONS BY MOUTH EVERY 4 TO 6 HOURS 18 g 1   No current facility-administered medications for this visit.    Allergies:   Patient has no known allergies.    Social History:  The patient  reports that she has never smoked. She has never used smokeless tobacco. She reports that she does not drink alcohol and does not use drugs.   Family History:  The patient's family history includes Asthma in her brother; Cancer in  her sister; Hypertension in her mother.    ROS:  Please see the history of present illness.   Otherwise, review of systems are positive for none.   All other systems are reviewed and negative.    PHYSICAL EXAM: VS:  BP 134/86   Pulse 73   Ht 5\' 4"  (1.626 m)   Wt 167 lb 8 oz (76 kg)   BMI 28.75 kg/m  , BMI Body mass index is 28.75 kg/m. GENERAL:  Well appearing HEENT: Pupils equal round and reactive, fundi not visualized, oral mucosa unremarkable NECK:  No jugular venous distention, waveform within normal limits, carotid  upstroke brisk and symmetric, no bruits, no thyromegaly LUNGS:  Clear to auscultation bilaterally HEART:  RRR.  PMI not displaced or sustained,S1 and S2 within normal limits, no S3, no S4, no clicks, no rubs, no murmurs ABD:  Flat, positive bowel sounds normal in frequency in pitch, no bruits, no rebound, no guarding, no midline pulsatile mass, no hepatomegaly, no splenomegaly EXT:  2 plus pulses throughout, no edema, no cyanosis no clubbing SKIN:  No rashes no nodules NEURO:  Cranial nerves II through XII grossly intact, motor grossly intact throughout PSYCH:  Cognitively intact, oriented to person place and time   EKG:  EKG is ordered today. 10/29/16: Sinus bradycardia. Rate 57 bpm. 10/29/17: Sinus rhythm.  Rate 63 bpm.   08/31/21: Sinus rhythm.  Rate 73 bpm.    Echo 12/15/15: Study Conclusions   - Left ventricle: The cavity size was mildly dilated. Systolic   function was mildly to moderately reduced. The estimated ejection   fraction was in the range of 40% to 45%. Mild diffuse hypokinesis   with no identifiable regional variations. Left ventricular   diastolic function parameters were mostly normal. Only the medial   annulus diastolic velocity was mildly reduced. - Pericardium, extracardiac: A trivial pericardial effusion was   identified.   Echo 03/20/16: LVEF 45-50%. Grade 2 diastolic dysfunction. Bileaflet mitral valve prolapse. Mild mitral regurgitation.   Recent Labs: No results found for requested labs within last 365 days.    Lipid Panel No results found for: "CHOL", "TRIG", "HDL", "CHOLHDL", "VLDL", "LDLCALC", "LDLDIRECT"    Wt Readings from Last 3 Encounters:  08/31/21 167 lb 8 oz (76 kg)  12/31/19 163 lb 12.8 oz (74.3 kg)  08/13/19 167 lb (75.8 kg)     ASSESSMENT AND PLAN:  Peripartum cardiomyopathy LVEF did not completely recover after her prior pregnancy.  She seems to be asymptomatic from a heart failure standpoint and has self discontinued all her heart  failure medications.  She is feeling well.  We will repeat her echo to better understand her risk of recurrent pericardial polymyopathy going forward.  If her LVEF remains reduced, she is at high risk of recurrent issues during pregnancy.  It is less clear what her risk is if her LVEF has recovered but prognosis would be better.  Based on these findings we will also know what to do with her medications if she is persistently hypertensive.  Essential hypertension Blood pressure is elevated both initially and on repeat.  She is going to check her blood pressures at home.  She is also to work on increasing her exercise and continue to limit sodium intake.  She be persistently hypertensive, given her desire for pregnancy would avoid ACE inhibitors or ARB's.  Would also avoid amlodipine if her LVEF remains reduced.  Consider labetalol.      Current medicines are reviewed at length with  the patient today.  The patient does not have concerns regarding medicines.  The following changes have been made:  no change  Labs/ tests ordered today include:   Orders Placed This Encounter  Procedures   EKG 12-Lead   ECHOCARDIOGRAM COMPLETE     Disposition:   FU with Lucienne Sawyers C. Duke Salvia, MD, [facility] in 1 year.  APP in 3 months     Signed, Jermanie Minshall C. Duke Salvia, MD, [facility]  08/31/2021 8:19 AM    [facility]

## 2021-08-31 NOTE — Assessment & Plan Note (Signed)
LVEF did not completely recover after her prior pregnancy.  She seems to be asymptomatic from a heart failure standpoint and has self discontinued all her heart failure medications.  She is feeling well.  We will repeat her echo to better understand her risk of recurrent pericardial polymyopathy going forward.  If her LVEF remains reduced, she is at high risk of recurrent issues during pregnancy.  It is less clear what her risk is if her LVEF has recovered but prognosis would be better.  Based on these findings we will also know what to do with her medications if she is persistently hypertensive.

## 2021-09-11 ENCOUNTER — Ambulatory Visit (INDEPENDENT_AMBULATORY_CARE_PROVIDER_SITE_OTHER): Payer: Managed Care, Other (non HMO)

## 2021-09-11 DIAGNOSIS — I5189 Other ill-defined heart diseases: Secondary | ICD-10-CM | POA: Diagnosis not present

## 2021-09-11 DIAGNOSIS — I081 Rheumatic disorders of both mitral and tricuspid valves: Secondary | ICD-10-CM | POA: Diagnosis not present

## 2021-09-11 DIAGNOSIS — I5042 Chronic combined systolic (congestive) and diastolic (congestive) heart failure: Secondary | ICD-10-CM

## 2021-09-11 LAB — ECHOCARDIOGRAM COMPLETE
AR max vel: 2.03 cm2
AV Area VTI: 1.81 cm2
AV Area mean vel: 1.89 cm2
AV Mean grad: 3 mmHg
AV Peak grad: 6.2 mmHg
Ao pk vel: 1.24 m/s
Area-P 1/2: 4.06 cm2
S' Lateral: 3.32 cm

## 2021-09-14 ENCOUNTER — Encounter: Payer: Managed Care, Other (non HMO) | Admitting: Radiology

## 2021-09-27 ENCOUNTER — Encounter: Payer: Managed Care, Other (non HMO) | Admitting: Radiology

## 2021-10-01 ENCOUNTER — Encounter (HOSPITAL_COMMUNITY): Payer: Self-pay

## 2021-10-01 ENCOUNTER — Emergency Department (HOSPITAL_COMMUNITY)
Admission: EM | Admit: 2021-10-01 | Discharge: 2021-10-02 | Discharge status: Against Medical Advice | Payer: Managed Care, Other (non HMO) | Attending: Emergency Medicine | Admitting: Emergency Medicine

## 2021-10-01 ENCOUNTER — Other Ambulatory Visit: Payer: Self-pay

## 2021-10-01 ENCOUNTER — Emergency Department (HOSPITAL_COMMUNITY): Payer: Managed Care, Other (non HMO)

## 2021-10-01 DIAGNOSIS — Z5321 Procedure and treatment not carried out due to patient leaving prior to being seen by health care provider: Secondary | ICD-10-CM | POA: Insufficient documentation

## 2021-10-01 DIAGNOSIS — R1032 Left lower quadrant pain: Secondary | ICD-10-CM | POA: Diagnosis not present

## 2021-10-01 LAB — URINALYSIS, ROUTINE W REFLEX MICROSCOPIC
Bilirubin Urine: NEGATIVE
Glucose, UA: NEGATIVE mg/dL
Hgb urine dipstick: NEGATIVE
Ketones, ur: NEGATIVE mg/dL
Nitrite: NEGATIVE
Protein, ur: NEGATIVE mg/dL
Specific Gravity, Urine: 1.015 (ref 1.005–1.030)
pH: 6.5 (ref 5.0–8.0)

## 2021-10-01 LAB — CBC WITH DIFFERENTIAL/PLATELET
Abs Immature Granulocytes: 0.02 10*3/uL (ref 0.00–0.07)
Basophils Absolute: 0.1 10*3/uL (ref 0.0–0.1)
Basophils Relative: 1 %
Eosinophils Absolute: 0.1 10*3/uL (ref 0.0–0.5)
Eosinophils Relative: 2 %
HCT: 31.8 % — ABNORMAL LOW (ref 36.0–46.0)
Hemoglobin: 9.1 g/dL — ABNORMAL LOW (ref 12.0–15.0)
Immature Granulocytes: 0 %
Lymphocytes Relative: 20 %
Lymphs Abs: 1.2 10*3/uL (ref 0.7–4.0)
MCH: 21 pg — ABNORMAL LOW (ref 26.0–34.0)
MCHC: 28.6 g/dL — ABNORMAL LOW (ref 30.0–36.0)
MCV: 73.4 fL — ABNORMAL LOW (ref 80.0–100.0)
Monocytes Absolute: 0.3 10*3/uL (ref 0.1–1.0)
Monocytes Relative: 6 %
Neutro Abs: 4.2 10*3/uL (ref 1.7–7.7)
Neutrophils Relative %: 71 %
Platelets: 209 10*3/uL (ref 150–400)
RBC: 4.33 MIL/uL (ref 3.87–5.11)
RDW: 15.2 % (ref 11.5–15.5)
WBC: 5.9 10*3/uL (ref 4.0–10.5)
nRBC: 0 % (ref 0.0–0.2)

## 2021-10-01 LAB — COMPREHENSIVE METABOLIC PANEL
ALT: 12 U/L (ref 0–44)
AST: 18 U/L (ref 15–41)
Albumin: 4 g/dL (ref 3.5–5.0)
Alkaline Phosphatase: 34 U/L — ABNORMAL LOW (ref 38–126)
Anion gap: 7 (ref 5–15)
BUN: 11 mg/dL (ref 6–20)
CO2: 22 mmol/L (ref 22–32)
Calcium: 8.9 mg/dL (ref 8.9–10.3)
Chloride: 109 mmol/L (ref 98–111)
Creatinine, Ser: 0.94 mg/dL (ref 0.44–1.00)
GFR, Estimated: >60 mL/min (ref >60)
Glucose, Bld: 101 mg/dL — ABNORMAL HIGH (ref 70–99)
Potassium: 4.1 mmol/L (ref 3.5–5.1)
Sodium: 138 mmol/L (ref 135–145)
Total Bilirubin: 0.7 mg/dL (ref 0.3–1.2)
Total Protein: 6.8 g/dL (ref 6.5–8.1)

## 2021-10-01 LAB — I-STAT BETA HCG BLOOD, ED (MC, WL, AP ONLY): I-stat hCG, quantitative: <5 m[IU]/mL (ref <5)

## 2021-10-01 LAB — LIPASE, BLOOD: Lipase: 33 U/L (ref 11–51)

## 2021-10-01 LAB — URINALYSIS, MICROSCOPIC (REFLEX): Bacteria, UA: NONE SEEN

## 2021-10-01 MED ORDER — IOHEXOL 300 MG/ML  SOLN
80.0000 mL | Freq: Once | INTRAMUSCULAR | Status: AC | PRN
  Administered 2021-10-01: 80 mL via INTRAVENOUS

## 2021-10-01 NOTE — ED Triage Notes (Addendum)
Pt arrived POV from home c/o left lower abdominal pain/left sided flank pain that wraps around to her back. Pt states this started a couple hrs prior to arrival. Pt endorses nausea but not vomiting.

## 2021-10-01 NOTE — ED Provider Triage Note (Signed)
Emergency Medicine Provider Triage Evaluation Note  Carolyn Lynn , a 36 y.o. female  was evaluated in triage.  Pt complains of acute onset of left-sided abdominal pain about 3 hours ago.  Denies fever, chills, vomiting, dysuria, vaginal discharge, vaginal bleeding.  Pain radiates to left flank.  Reports history of left oophorectomy.  Review of Systems  Positive: As above Negative: As above  Physical Exam  There were no vitals taken for this visit. Gen:   Awake, no distress   Resp:  Normal effort  MSK:   Moves extremities without difficulty  Other:  Abdomen nontender and nondistended.  Medical Decision Making  Medically screening exam initiated at 2:51 PM.  Appropriate orders placed.  Carolyn Lynn was informed that the remainder of the evaluation will be completed by another provider, this initial triage assessment does not replace that evaluation, and the importance of remaining in the ED until their evaluation is complete.     Marita Kansas, PA-C 10/01/21 1452

## 2021-10-01 NOTE — ED Notes (Signed)
Pt wanted the IV taken out and left. Pt stated the wait was too long. Pt seen walking out

## 2021-10-05 NOTE — Progress Notes (Signed)
Cardiology Office Note:    Date:  10/06/2021   ID:  Carolyn Lynn, DOB 27-Jan-1986, MRN [id number]  PCP:  Carolyn Lynn   [facility] Cardiologist:  Carolyn Latch, MD     Referring MD: No ref. provider found   CC: Here for follow up appointment for HTN and Peripartum CM  History of Present Illness:    [name] is a 36 y.o. female with a hx of the following:  Chronic combined systolic and diastolic CHF, peripartum CM Anemia CKD HTN Mild persistent asthma   In 2017 she presented to the emergency department after delivering a healthy baby a few weeks prior, with chief complaint of lower extremity edema and shortness of breath.  Echo revealed LVEF 40 to 45%.  At that time, had mild left hydronephrosis and enlarged hydrosalpinges and a possible UPJ stone, tested positive for Klebsiella pneumonia UTI, w/ concern for pneumonia.  Was also noted to have large bilateral hematosalpinx, had severe anemia with hemoglobin of 4.9, quired 5 units of packed red blood cells.  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  Repeat 2D echo in February 2018 showed LVEF of 45 to [id number], grade 2 diastolic dysfunction, bileaflet mitral valve prolapse with mild mitral valve regurgitation.   Stopped lisinopril - no allergic.   She was last seen by Dr. Oval Lynn on August 31, 2021 and had been feeling fine.  Was cooking at home and limiting her sodium intake.  It was found that she had self discontinued all her heart failure medications but was asymptomatic and doing well from a heart failure standpoint.  A repeat echocardiogram on 08/2021 revealed LVEF 45 to 50%, mild mitral valve regurgitation, mild late systolic prolapse of both leaflets of the mitral valve, no significant change from prior study, all other findings are normal. BP in the office that day was not at goal, 134/86.  Recommendations were discussed.   She presented to the emergency department on October 01, 2021 with chief complaint of left-sided abdominal pain, and left-sided flank pain that wrapped around her back.  Nausea but denied vomiting.  CMP relatively unremarkable, CBC revealed hemoglobin 9.1, hematocrit 31.8, MCV 73.4, MCH 21.0, MCHC 28.6, negative lipase, unremarkable urinalysis, negative pregnancy test, CT of abdomen and pelvis with contrast revealed bilateral ovarian complex cystic structures, there was clinical concern for ovarian torsion, pelvic sonogram was recommended for further evaluation.  It also revealed hypodense cysts in the liver and bilateral kidneys, likely simple cyst, not significant change from prior exam, no evidence of nephrolithiasis or hydronephrosis, mild left renal atrophy and unchanged, no other acute findings. She left AMA due to the long wait.   Patient presents for follow-up.  She states she is doing well from a cardiac perspective, she has not had any additional abdominal pain since her last ED visit.  She brings her blood pressure log, however she states she is not the best at checking this daily.  Since her last office visit she checked her blood pressure about 4 5 times, well-controlled and at goal with systolic blood pressure less than 130.  Denies any chest pain, shortness of breath, swelling or significant weight changes, syncope, presyncope, dizziness, orthopnea, PND, palpitations, or any acute bleeding.  States at this point she does not know if she will be getting pregnant in the future, currently is not planning this at this time.  She is feeling fine today and denies any other questions  or concerns today.  Past Medical History:  Diagnosis Date   Anemia    Chronic combined systolic and diastolic heart failure (HCC) 03/29/2016   Chronic kidney disease    left kidney smaller than right   Essential hypertension 08/31/2021   History of blood transfusion 12/2015   Medical history non-contributory    Mild persistent asthma 12/31/2019    Past Surgical History:   Procedure Laterality Date   LAPAROSCOPY N/A 05/03/2016   Procedure: LAPAROSCOPY DIAGNOSTIC;  Surgeon: Adam Phenix, MD;  Location: [facility];  Service: Gynecology;  Laterality: N/A;   LAPAROTOMY N/A 05/03/2016   Procedure: LAPAROTOMY;  Surgeon: Adam Phenix, MD;  Location: [facility];  Service: Gynecology;  Laterality: N/A;   NO PAST SURGERIES     SALPINGOOPHORECTOMY Left 05/03/2016   Procedure: SALPINGO OOPHORECTOMY;  Surgeon: Adam Phenix, MD;  Location: [facility];  Service: Gynecology;  Laterality: Left;    Current Medications: Current Meds  Medication Sig   albuterol (VENTOLIN HFA) 108 (90 Base) MCG/ACT inhaler INHALE 2 INHALATIONS BY MOUTH EVERY 4 TO 6 HOURS     Allergies:   Patient has no known allergies.   Social History   Socioeconomic History   Marital status: Single    Spouse name: Not on file   Number of children: Not on file   Years of education: Not on file   Highest education level: Not on file  Occupational History   Not on file  Tobacco Use   Smoking status: Never   Smokeless tobacco: Never  Vaping Use   Vaping Use: Never used  Substance and Sexual Activity   Alcohol use: No   Drug use: No   Sexual activity: Yes    Birth control/protection: None  Other Topics Concern   Not on file  Social History Narrative   Not on file   Social Determinants of Health   Financial Resource Strain: Not on file  Food Insecurity: Not on file  Transportation Needs: Not on file  Physical Activity: Not on file  Stress: Not on file  Social Connections: Not on file     Family History: The patient's family history includes Asthma in her brother; Cancer in her sister; Hypertension in her mother.  ROS:   Review of Systems  Constitutional: Negative.   HENT: Negative.    Eyes: Negative.   Respiratory: Negative.    Cardiovascular: Negative.   Gastrointestinal: Negative.   Genitourinary: Negative.   Musculoskeletal: Negative.   Skin: Negative.   Neurological: Negative.    Endo/Heme/Allergies: Negative.   Psychiatric/Behavioral: Negative.     Please see the history of present illness.    All other systems reviewed and are negative.  EKGs/Labs/Other Studies Reviewed:    The following studies were reviewed today:   EKG:  EKG is not ordered today.    2D echocardiogram on September 11, 2021: 1. Left ventricular ejection fraction, by estimation, is 45 to 50%. The  left ventricle has mildly decreased function. The left ventricle  demonstrates global hypokinesis. Left ventricular diastolic parameters  were normal.   2. Right ventricular systolic function is normal. The right ventricular  size is normal. There is normal pulmonary artery systolic pressure. The  estimated right ventricular systolic pressure is 16.4 mmHg.   3. The mitral valve is myxomatous. Mild mitral valve regurgitation. No  evidence of mitral stenosis. There is mild late systolic prolapse of both  leaflets of the mitral valve.   4. The aortic valve is tricuspid. Aortic  valve regurgitation is not  visualized. No aortic stenosis is present.   5. The inferior vena cava is normal in size with greater than 50%  respiratory variability, suggesting right atrial pressure of 3 mmHg.   Comparison(s): No significant change from prior study.    Recent Labs: 10/01/2021: ALT 12; BUN 11; Creatinine, Ser 0.94; Hemoglobin 9.1; Platelets 209; Potassium 4.1; Sodium 138  Recent Lipid Panel No results found for: "CHOL", "TRIG", "HDL", "CHOLHDL", "VLDL", "LDLCALC", "LDLDIRECT"    Physical Exam:    VS:  BP (!) 130/92   Pulse 75   Ht 5\' 4"  (1.626 m)   Wt 162 lb 4.8 oz (73.6 kg)   BMI 27.86 kg/m     Wt Readings from Last 3 Encounters:  10/06/21 162 lb 4.8 oz (73.6 kg)  10/01/21 168 lb (76.2 kg)  08/31/21 167 lb 8 oz (76 kg)     GEN: Well nourished, well developed in no acute distress HEENT: Normal NECK: No JVD; No carotid bruits CARDIAC: RRR, no murmurs, rubs, gallops; 2+ peripheral pulses  throughout, strong and equal bilaterally RESPIRATORY:  Clear and diminished to auscultation without rales, wheezing or rhonchi  MUSCULOSKELETAL:  No edema; No deformity  SKIN: Warm and dry NEUROLOGIC:  Alert and oriented x 3 PSYCHIATRIC:  Normal affect   ASSESSMENT:    1. Peripartum cardiomyopathy   2. Hypertension, unspecified type   3. Screening for cholesterol level    PLAN:    In order of problems listed above:  Peripartum cardiomyopathy-chronic, progressing Euvolemic and well compensated on exam. Echocardiogram in July 2023 revealed mild improvement in EF, although still mildly reduced, 45 to 50%.  She is currently not on any blood pressure medication, and considering that she might get pregnant in the future, do not want to start ACE inhibitor or ARB for her, will initiate labetalol 100 mg twice daily.  Obtain TSH for monitoring before next office visit. Low sodium diet, fluid restriction <2L, and daily weights encouraged. Educated to contact our office for weight gain of 2 lbs overnight or 5 lbs in one week.  2.  Hypertension-chronic, stable Currently not on any blood pressure medicine.  Will initiate labetalol 100 mg twice daily as mentioned above to improve overall heart function. Discussed to monitor BP at home at least 2 hours after medications and sitting for 5-10 minutes.  Low-salt diet and Mediterranean diet discussed with her today.  Encouraged moderate activity for 5 days a week, 30 minutes a day.   3.  Screening for cholesterol level Says her dad passed away from previous cardiac history, she cannot remember what his cardiac history was. She has not had a lipid panel checked, we will arrange this and hepatic function panel before next office visit.     4. Disposition: Follow up in 3 months or sooner if anything changes   Medication Adjustments/Labs and Tests Ordered: Current medicines are reviewed at length with the patient today.  Concerns regarding medicines are  outlined above.  Orders Placed This Encounter  Procedures   TSH   Hepatic function panel   Lipid panel   Meds ordered this encounter  Medications   labetalol (NORMODYNE) 100 MG tablet    Sig: Take 1 tablet (100 mg total) by mouth 2 (two) times daily.    Dispense:  180 tablet    Refill:  3    Patient Instructions  Medication Instructions:  Your physician has recommended you make the following change in your medication:   Start: Labetalol  100mg  twice daily- Please use the attached blood pressure log to track your blood pressures twice daily! Please bring this log with you to your follow up appointments.    *If you need a refill on your cardiac medications before your next appointment, please call your pharmacy*   Lab Work: Please return for Lab work 1 week before your next appointment for fasting labs- TSH, Lipid Panel and Liver Function Tests. You may come to the...   [facility] (3rd floor) [address] [place], [place] [address]  Open: 8am-Noon and 1pm-4:30pm  Please ring the doorbell on the small table when you exit the elevator and the Lab Tech will come get you  [place] [facility] [address] [place], [place], [place] [address] Open: 8am-1pm, then 2pm-4:30pm   [facility]- Please see attached locations sheet stapled to your lab work with address and hours.   If you have labs (blood work) drawn today and your tests are completely normal, you will receive your results only by: [place] (if you have MyChart) OR A paper copy in the mail If you have any lab test that is abnormal or we need to change your treatment, we will call you to review the results.  Follow-Up: At [facility], you and your health needs are our priority.  As part of our continuing mission to provide you with exceptional heart care, we have created designated Provider Care Teams.  These Care Teams include your primary Cardiologist (physician) and  Advanced Practice Providers (APPs -  Physician Assistants and Nurse Practitioners) who all work together to provide you with the care you need, when you need it.  We recommend signing up for the patient portal called "MyChart".  Sign up information is provided on this After Visit Summary.  MyChart is used to connect with patients for Virtual Visits (Telemedicine).  Patients are able to view lab/test results, encounter notes, upcoming appointments, etc.  Non-urgent messages can be sent to your provider as well.   To learn more about what you can do with MyChart, go to [email or web address].    Your next appointment:   3 month(s)  The format for your next appointment:   In Person  Provider:   Skeet Latch, MD or Laurann Montana, NP{  Other Instructions:  Mediterranean Diet A Mediterranean diet refers to food and lifestyle choices that are based on the traditions of countries located on the [place]. It focuses on eating more fruits, vegetables, whole grains, beans, nuts, seeds, and heart-healthy fats, and eating less dairy, meat, eggs, and processed foods with added sugar, salt, and fat. This way of eating has been shown to help prevent certain conditions and improve outcomes for people who have chronic diseases, like kidney disease and heart disease. What are tips for following this plan? Reading food labels Check the serving size of packaged foods. For foods such as rice and pasta, the serving size refers to the amount of cooked product, not dry. Check the total fat in packaged foods. Avoid foods that have saturated fat or trans fats. Check the ingredient list for added sugars, such as corn syrup. Shopping  Buy a variety of foods that offer a balanced diet, including: Fresh fruits and vegetables (produce). Grains, beans, nuts, and seeds. Some of these may be available in unpackaged forms or large amounts (in bulk). Fresh seafood. Poultry and eggs. Low-fat dairy  products. Buy whole ingredients instead of prepackaged foods. Buy fresh fruits and vegetables in-season  from local farmers markets. Buy plain frozen fruits and vegetables. If you do not have access to quality fresh seafood, buy precooked frozen shrimp or canned fish, such as tuna, salmon, or sardines. Stock your pantry so you always have certain foods on hand, such as olive oil, canned tuna, canned tomatoes, rice, pasta, and beans. Cooking Cook foods with extra-virgin olive oil instead of using butter or other vegetable oils. Have meat as a side dish, and have vegetables or grains as your main dish. This means having meat in small portions or adding small amounts of meat to foods like pasta or stew. Use beans or vegetables instead of meat in common dishes like chili or lasagna. Experiment with different cooking methods. Try roasting, broiling, steaming, and sauting vegetables. Add frozen vegetables to soups, stews, pasta, or rice. Add nuts or seeds for added healthy fats and plant protein at each meal. You can add these to yogurt, salads, or vegetable dishes. Marinate fish or vegetables using olive oil, lemon juice, garlic, and fresh herbs. Meal planning Plan to eat one vegetarian meal one day each week. Try to work up to two vegetarian meals, if possible. Eat seafood two or more times a week. Have healthy snacks readily available, such as: Vegetable sticks with hummus. Greek yogurt. Fruit and nut trail mix. Eat balanced meals throughout the week. This includes: Fruit: 2-3 servings a day. Vegetables: 4-5 servings a day. Low-fat dairy: 2 servings a day. Fish, poultry, or lean meat: 1 serving a day. Beans and legumes: 2 or more servings a week. Nuts and seeds: 1-2 servings a day. Whole grains: 6-8 servings a day. Extra-virgin olive oil: 3-4 servings a day. Limit red meat and sweets to only a few servings a month. Lifestyle  Cook and eat meals together with your family, when  possible. Drink enough fluid to keep your urine pale yellow. Be physically active every day. This includes: Aerobic exercise like running or swimming. Leisure activities like gardening, walking, or housework. Get 7-8 hours of sleep each night. If recommended by your health care provider, drink red wine in moderation. This means 1 glass a day for nonpregnant women and 2 glasses a day for men. A glass of wine equals 5 oz (150 mL). What foods should I eat? Fruits Apples. Apricots. Avocado. Berries. Bananas. Cherries. Dates. Figs. Grapes. Lemons. Melon. Oranges. Peaches. Plums. Pomegranate. Vegetables Artichokes. Beets. Broccoli. Cabbage. Carrots. Eggplant. Green beans. Chard. Kale. Spinach. Onions. Leeks. Peas. Squash. Tomatoes. Peppers. Radishes. Grains Whole-grain pasta. Brown rice. Bulgur wheat. Polenta. Couscous. Whole-wheat bread. [place]pah Cobb. Meats and other proteins Beans. Almonds. Sunflower seeds. Pine nuts. Peanuts. Cod. Salmon. Scallops. Shrimp. Tuna. Tilapia. Clams. Oysters. Eggs. Poultry without skin. Dairy Low-fat milk. Cheese. Greek yogurt. Fats and oils Extra-virgin olive oil. Avocado oil. Grapeseed oil. Beverages Water. Red wine. Herbal tea. Sweets and desserts Greek yogurt with honey. Baked apples. Poached pears. Trail mix. Seasonings and condiments Basil. Cilantro. Coriander. Cumin. Mint. Parsley. Sage. Rosemary. Tarragon. Garlic. Oregano. Thyme. Pepper. Balsamic vinegar. Tahini. Hummus. Tomato sauce. Olives. Mushrooms. The items listed above may not be a complete list of foods and beverages you can eat. Contact a dietitian for more information. What foods should I limit? This is a list of foods that should be eaten rarely or only on special occasions. Fruits Fruit canned in syrup. Vegetables Deep-fried potatoes (french fries). Grains Prepackaged pasta or rice dishes. Prepackaged cereal with added sugar. Prepackaged snacks with added sugar. Meats and other  proteins Beef. Pork. Lamb. Poultry with  skin. Hot dogs. Berniece Salines. Dairy Ice cream. Sour cream. Whole milk. Fats and oils Butter. Canola oil. Vegetable oil. Beef fat (tallow). Lard. Beverages Juice. Sugar-sweetened soft drinks. Beer. Liquor and spirits. Sweets and desserts Cookies. Cakes. Pies. Candy. Seasonings and condiments Mayonnaise. Pre-made sauces and marinades. The items listed above may not be a complete list of foods and beverages you should limit. Contact a dietitian for more information. Summary The Mediterranean diet includes both food and lifestyle choices. Eat a variety of fresh fruits and vegetables, beans, nuts, seeds, and whole grains. Limit the amount of red meat and sweets that you eat. If recommended by your health care provider, drink red wine in moderation. This means 1 glass a day for nonpregnant women and 2 glasses a day for men. A glass of wine equals 5 oz (150 mL). This information is not intended to replace advice given to you by your health care provider. Make sure you discuss any questions you have with your health care provider. Document Revised: 03/06/2019 Document Reviewed: 01/01/2019 Elsevier Patient Education  2022 [place], [place], [place]  10/06/2021 1:18 PM    [facility]

## 2021-10-06 ENCOUNTER — Encounter (HOSPITAL_BASED_OUTPATIENT_CLINIC_OR_DEPARTMENT_OTHER): Payer: Self-pay | Admitting: Nurse Practitioner

## 2021-10-06 ENCOUNTER — Ambulatory Visit (INDEPENDENT_AMBULATORY_CARE_PROVIDER_SITE_OTHER): Payer: Managed Care, Other (non HMO) | Admitting: Nurse Practitioner

## 2021-10-06 VITALS — BP 130/92 | HR 75 | Ht 64.0 in | Wt 162.3 lb

## 2021-10-06 DIAGNOSIS — I10 Essential (primary) hypertension: Secondary | ICD-10-CM

## 2021-10-06 DIAGNOSIS — Z13220 Encounter for screening for lipoid disorders: Secondary | ICD-10-CM

## 2021-10-06 DIAGNOSIS — O903 Peripartum cardiomyopathy: Secondary | ICD-10-CM | POA: Diagnosis not present

## 2021-10-06 MED ORDER — LABETALOL HCL 100 MG PO TABS: 100.0000 mg | ORAL_TABLET | Freq: Two times a day (BID) | ORAL | 3 refills | Status: AC

## 2021-10-06 NOTE — Patient Instructions (Signed)
Medication Instructions:  Your physician has recommended you make the following change in your medication:   Start: Labetalol 100mg  twice daily- Please use the attached blood pressure log to track your blood pressures twice daily! Please bring this log with you to your follow up appointments.    *If you need a refill on your cardiac medications before your next appointment, please call your pharmacy*   Lab Work: Please return for Lab work 1 week before your next appointment for fasting labs- TSH, Lipid Panel and Liver Function Tests. You may come to the...   [facility] (3rd floor) [address], [place], [place]  Open: 8am-Noon and 1pm-4:30pm  Please ring the doorbell on the small table when you exit the elevator and the Lab Tech will come get you  [facility] [address], [place], [place] Open: 8am-1pm, then 2pm-4:30pm   [facility]- Please see attached locations sheet stapled to your lab work with address and hours.   If you have labs (blood work) drawn today and your tests are completely normal, you will receive your results only by: MyChart Message (if you have MyChart) OR A paper copy in the mail If you have any lab test that is abnormal or we need to change your treatment, we will call you to review the results.  Follow-Up: At [facility], you and your health needs are our priority.  As part of our continuing mission to provide you with exceptional heart care, we have created designated Provider Care Teams.  These Care Teams include your primary Cardiologist (physician) and Advanced Practice Providers (APPs -  Physician Assistants and Nurse Practitioners) who all work together to provide you with the care you need, when you need it.  We recommend signing up for the patient portal called "MyChart".  Sign up information is provided on this After Visit Summary.  MyChart is used to connect with  patients for Virtual Visits (Telemedicine).  Patients are able to view lab/test results, encounter notes, upcoming appointments, etc.  Non-urgent messages can be sent to your provider as well.   To learn more about what you can do with MyChart, go to [facility].    Your next appointment:   3 month(s)  The format for your next appointment:   In Person  Provider:   [email or web address], MD or Chilton Si, NP{  Other Instructions:  Mediterranean Diet A Mediterranean diet refers to food and lifestyle choices that are based on the traditions of countries located on the [place] Sea. It focuses on eating more fruits, vegetables, whole grains, beans, nuts, seeds, and heart-healthy fats, and eating less dairy, meat, eggs, and processed foods with added sugar, salt, and fat. This way of eating has been shown to help prevent certain conditions and improve outcomes for people who have chronic diseases, like kidney disease and heart disease. What are tips for following this plan? Reading food labels Check the serving size of packaged foods. For foods such as rice and pasta, the serving size refers to the amount of cooked product, not dry. Check the total fat in packaged foods. Avoid foods that have saturated fat or trans fats. Check the ingredient list for added sugars, such as corn syrup. Shopping  Buy a variety of foods that offer a balanced diet, including: Fresh fruits and vegetables (produce). Grains, beans, nuts, and seeds. Some of these may be available in unpackaged forms or large amounts (in bulk). Fresh seafood. Poultry  and eggs. Low-fat dairy products. Buy whole ingredients instead of prepackaged foods. Buy fresh fruits and vegetables in-season from local farmers markets. Buy plain frozen fruits and vegetables. If you do not have access to quality fresh seafood, buy precooked frozen shrimp or canned fish, such as tuna, salmon, or sardines. Stock your pantry so you  always have certain foods on hand, such as olive oil, canned tuna, canned tomatoes, rice, pasta, and beans. Cooking Cook foods with extra-virgin olive oil instead of using butter or other vegetable oils. Have meat as a side dish, and have vegetables or grains as your main dish. This means having meat in small portions or adding small amounts of meat to foods like pasta or stew. Use beans or vegetables instead of meat in common dishes like chili or lasagna. Experiment with different cooking methods. Try roasting, broiling, steaming, and sauting vegetables. Add frozen vegetables to soups, stews, pasta, or rice. Add nuts or seeds for added healthy fats and plant protein at each meal. You can add these to yogurt, salads, or vegetable dishes. Marinate fish or vegetables using olive oil, lemon juice, garlic, and fresh herbs. Meal planning Plan to eat one vegetarian meal one day each week. Try to work up to two vegetarian meals, if possible. Eat seafood two or more times a week. Have healthy snacks readily available, such as: Vegetable sticks with hummus. Greek yogurt. Fruit and nut trail mix. Eat balanced meals throughout the week. This includes: Fruit: 2-3 servings a day. Vegetables: 4-5 servings a day. Low-fat dairy: 2 servings a day. Fish, poultry, or lean meat: 1 serving a day. Beans and legumes: 2 or more servings a week. Nuts and seeds: 1-2 servings a day. Whole grains: 6-8 servings a day. Extra-virgin olive oil: 3-4 servings a day. Limit red meat and sweets to only a few servings a month. Lifestyle  Cook and eat meals together with your family, when possible. Drink enough fluid to keep your urine pale yellow. Be physically active every day. This includes: Aerobic exercise like running or swimming. Leisure activities like gardening, walking, or housework. Get 7-8 hours of sleep each night. If recommended by your health care provider, drink red wine in moderation. This means 1 glass  a day for nonpregnant women and 2 glasses a day for men. A glass of wine equals 5 oz (150 mL). What foods should I eat? Fruits Apples. Apricots. Avocado. Berries. Bananas. Cherries. Dates. Figs. Grapes. Lemons. Melon. Oranges. Peaches. Plums. Pomegranate. Vegetables Artichokes. Beets. Broccoli. Cabbage. Carrots. Eggplant. Green beans. Chard. Kale. Spinach. Onions. Leeks. Peas. Squash. Tomatoes. Peppers. Radishes. Grains Whole-grain pasta. Brown rice. Bulgur wheat. Polenta. Couscous. Whole-wheat bread. Orpah Cobb. Meats and other proteins Beans. Almonds. Sunflower seeds. Pine nuts. Peanuts. Cod. Salmon. Scallops. Shrimp. Tuna. Tilapia. Clams. Oysters. Eggs. Poultry without skin. Dairy Low-fat milk. Cheese. Greek yogurt. Fats and oils Extra-virgin olive oil. Avocado oil. Grapeseed oil. Beverages Water. Red wine. Herbal tea. Sweets and desserts Greek yogurt with honey. Baked apples. Poached pears. Trail mix. Seasonings and condiments Basil. Cilantro. Coriander. Cumin. Mint. Parsley. Sage. Rosemary. Tarragon. Garlic. Oregano. Thyme. Pepper. Balsamic vinegar. Tahini. Hummus. Tomato sauce. Olives. Mushrooms. The items listed above may not be a complete list of foods and beverages you can eat. Contact a dietitian for more information. What foods should I limit? This is a list of foods that should be eaten rarely or only on special occasions. Fruits Fruit canned in syrup. Vegetables Deep-fried potatoes (french fries). Grains Prepackaged pasta or rice dishes. Prepackaged  cereal with added sugar. Prepackaged snacks with added sugar. Meats and other proteins Beef. Pork. Lamb. Poultry with skin. Hot dogs. Tomasa Blase. Dairy Ice cream. Sour cream. Whole milk. Fats and oils Butter. Canola oil. Vegetable oil. Beef fat (tallow). Lard. Beverages Juice. Sugar-sweetened soft drinks. Beer. Liquor and spirits. Sweets and desserts Cookies. Cakes. Pies. Candy. Seasonings and condiments Mayonnaise.  Pre-made sauces and marinades. The items listed above may not be a complete list of foods and beverages you should limit. Contact a dietitian for more information. Summary The Mediterranean diet includes both food and lifestyle choices. Eat a variety of fresh fruits and vegetables, beans, nuts, seeds, and whole grains. Limit the amount of red meat and sweets that you eat. If recommended by your health care provider, drink red wine in moderation. This means 1 glass a day for nonpregnant women and 2 glasses a day for men. A glass of wine equals 5 oz (150 mL). This information is not intended to replace advice given to you by your health care provider. Make sure you discuss any questions you have with your health care provider. Document Revised: 03/06/2019 Document Reviewed: 01/01/2019 Elsevier Patient Education  2022 [employer].

## 2021-10-09 ENCOUNTER — Encounter: Payer: Self-pay | Admitting: Radiology

## 2021-10-09 ENCOUNTER — Ambulatory Visit (INDEPENDENT_AMBULATORY_CARE_PROVIDER_SITE_OTHER): Payer: Managed Care, Other (non HMO) | Admitting: Radiology

## 2021-10-09 ENCOUNTER — Other Ambulatory Visit (HOSPITAL_COMMUNITY)
Admission: RE | Admit: 2021-10-09 | Discharge: 2021-10-09 | Disposition: A | Discharge status: Home / Self Care | Payer: Managed Care, Other (non HMO) | Source: Ambulatory Visit | Attending: Radiology | Admitting: Radiology

## 2021-10-09 VITALS — BP 126/90 | Ht 64.0 in | Wt 161.0 lb

## 2021-10-09 DIAGNOSIS — R1032 Left lower quadrant pain: Secondary | ICD-10-CM | POA: Diagnosis not present

## 2021-10-09 DIAGNOSIS — Z01419 Encounter for gynecological examination (general) (routine) without abnormal findings: Secondary | ICD-10-CM | POA: Diagnosis not present

## 2021-10-09 DIAGNOSIS — N926 Irregular menstruation, unspecified: Secondary | ICD-10-CM

## 2021-10-09 LAB — PREGNANCY, URINE: Preg Test, Ur: NEGATIVE

## 2021-10-09 NOTE — Progress Notes (Signed)
Carolyn Lynn 1985-05-09 [id number]   History:  36 y.o. G1P1 presents for annual exam. Evaluated at [facility] ED for LLQ pelvic pain 1 week ago (had CT and blood work).  Pain is better today.  Complains of LMP 09/10/21 heavy, dark color. LSO 04/2016 for left tubo ovarian abcess  Gynecologic History Patient's last menstrual period was 09/10/2021 (approximate). Period Cycle (Days): 28 Period Duration (Days): 4 Period Pattern: Regular Menstrual Flow: Light, Heavy (light to heavy) Menstrual Control: Maxi pad Dysmenorrhea: (!) Moderate Dysmenorrhea Symptoms: Cramping Contraception/Family planning: abstinence Sexually active: no Last Pap: 2017. Results were: normal Last mammogram: n/a  Obstetric History OB History  Gravida Para Term Preterm AB Living  1 1 1     1   SAB IAB Ectopic Multiple Live Births        0 1    # Outcome Date GA Lbr Len/2nd Weight Sex Delivery Anes PTL Lv  1 Term 11/13/15 [redacted]w[redacted]d 11:31 / 00:35 7 lb 1.8 oz (3.225 kg) M Vag-Spont EPI  LIV     The following portions of the patient's history were reviewed and updated as appropriate: allergies, current medications, past family history, past medical history, past social history, past surgical history, and problem list.  Review of Systems Pertinent items noted in HPI and remainder of comprehensive ROS otherwise negative.   Past medical history, past surgical history, family history and social history were all reviewed and documented in the EPIC chart.   Exam:  Vitals:   10/09/21 0823  BP: (!) 126/90  Weight: 161 lb (73 kg)  Height: 5\' 4"  (1.626 m)   Body mass index is 27.64 kg/m.  General appearance:  Normal Thyroid:  Symmetrical, normal in size, without palpable masses or nodularity. Respiratory  Auscultation:  Clear without wheezing or rhonchi Cardiovascular  Auscultation:  Regular rate, without rubs, murmurs or gallops  Edema/varicosities:  Not grossly evident Abdominal  Soft,nontender, without  masses, guarding or rebound.  Liver/spleen:  No organomegaly noted  Hernia:  None appreciated  Skin  Inspection:  Grossly normal Breasts: Examined lying and sitting.   Right: Without masses, retractions, nipple discharge or axillary adenopathy.   Left: Without masses, retractions, nipple discharge or axillary adenopathy. Genitourinary   Inguinal/mons:  Normal without inguinal adenopathy  External genitalia:  Normal appearing vulva with no masses, tenderness, or lesions  BUS/Urethra/Skene's glands:  Normal without masses or exudate  Vagina:  Normal appearing with normal color and discharge, no lesions  Cervix:  Normal appearing without discharge or lesions  Uterus:  Normal in size, shape and contour.  Mobile, nontender  Adnexa/parametria:     Rt: Normal in size, without masses or tenderness.   Lt: absent  Anus and perineum: Normal   Patient informed chaperone available to be present for breast and pelvic exam. Patient has requested no chaperone to be present. Patient has been advised what will be completed during breast and pelvic exam.   Assessment/Plan:   1. Well woman exam with routine gynecological exam  - Cytology - PAP( [place])  2. Irregular periods  - Pregnancy, urine  3. LLQ pain Currently resolved, if it returns or next menses is abnormally heavy again will schedule u/s Hx of LSO, likely not gyn cause  Discussed SBE, mammo at 41 ,colonoscopy at 46 and DEXA screening as directed/appropriate. Recommend 10/11/21 of exercise weekly, including weight bearing exercise. Encouraged the use of seatbelts and sunscreen. Return in 1 year for annual or as needed.   Ruthvik Barnaby B  WHNP-BC 8:50 AM 10/09/2021

## 2021-10-17 LAB — CYTOLOGY - PAP
Comment: NEGATIVE
Diagnosis: NEGATIVE
Diagnosis: REACTIVE
High risk HPV: NEGATIVE

## 2022-01-08 ENCOUNTER — Ambulatory Visit (INDEPENDENT_AMBULATORY_CARE_PROVIDER_SITE_OTHER): Payer: Managed Care, Other (non HMO) | Admitting: Family

## 2022-01-08 ENCOUNTER — Encounter (HOSPITAL_BASED_OUTPATIENT_CLINIC_OR_DEPARTMENT_OTHER): Payer: Self-pay | Admitting: Family

## 2022-01-08 VITALS — BP 126/88 | HR 80 | Ht 64.0 in | Wt 164.0 lb

## 2022-01-08 DIAGNOSIS — R0609 Other forms of dyspnea: Secondary | ICD-10-CM | POA: Diagnosis not present

## 2022-01-08 DIAGNOSIS — I10 Essential (primary) hypertension: Secondary | ICD-10-CM

## 2022-01-08 DIAGNOSIS — O903 Peripartum cardiomyopathy: Secondary | ICD-10-CM

## 2022-01-08 MED ORDER — ALBUTEROL SULFATE HFA 108 (90 BASE) MCG/ACT IN AERS: INHALATION_SPRAY | RESPIRATORY_TRACT | 1 refills | Status: AC

## 2022-01-08 NOTE — Patient Instructions (Signed)
Medication Instructions:  Your Physician recommend you continue on your current medication as directed.    *If you need a refill on your cardiac medications before your next appointment, please call your pharmacy*  Follow-Up: At [facility], you and your health needs are our priority.  As part of our continuing mission to provide you with exceptional heart care, we have created designated Provider Care Teams.  These Care Teams include your primary Cardiologist (physician) and Advanced Practice Providers (APPs -  Physician Assistants and Nurse Practitioners) who all work together to provide you with the care you need, when you need it.  We recommend signing up for the patient portal called "MyChart".  Sign up information is provided on this After Visit Summary.  MyChart is used to connect with patients for Virtual Visits (Telemedicine).  Patients are able to view lab/test results, encounter notes, upcoming appointments, etc.  Non-urgent messages can be sent to your provider as well.   To learn more about what you can do with MyChart, go to [email or web address].    Your next appointment:   6 month(s)  The format for your next appointment:   In Person  Provider:   Chilton Si, MD or Gillian Shields, NP    Other Instructions Heart Healthy Diet Recommendations: A low-salt diet is recommended. Meats should be grilled, baked, or boiled. Avoid fried foods. Focus on lean protein sources like fish or chicken with vegetables and fruits. The American Heart Association is a [employer]!  American Heart Association Diet and Lifeystyle Recommendations   Exercise recommendations: The American Heart Association recommends 150 minutes of moderate intensity exercise weekly. Try 30 minutes of moderate intensity exercise 4-5 times per week. This could include walking, jogging, or swimming.   Important Information About Sugar

## 2022-01-08 NOTE — Progress Notes (Signed)
Office Visit    Patient Name: Carolyn Lynn Date of Encounter: 01/08/2022  PCP:  Aviva Kluver   [facility]  Cardiologist:  Chilton Si, MD  Advanced Practice Provider:  No care team member to display Electrophysiologist:  None     Chief Complaint    Carolyn Lynn is a 36 y.o. female presents today for blood pressure follow up.   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   Chronic kidney disease    left kidney smaller than right   Essential hypertension 08/31/2021   History of blood transfusion 12/2015   Medical history non-contributory    Mild persistent asthma 12/31/2019   Past Surgical History:  Procedure Laterality Date   LAPAROSCOPY N/A 05/03/2016   Procedure: LAPAROSCOPY DIAGNOSTIC;  Surgeon: Adam Phenix, MD;  Location: [facility];  Service: Gynecology;  Laterality: N/A;   LAPAROTOMY N/A 05/03/2016   Procedure: LAPAROTOMY;  Surgeon: Adam Phenix, MD;  Location: [facility];  Service: Gynecology;  Laterality: N/A;   NO PAST SURGERIES     SALPINGOOPHORECTOMY Left 05/03/2016   Procedure: SALPINGO OOPHORECTOMY;  Surgeon: Adam Phenix, MD;  Location: [facility];  Service: Gynecology;  Laterality: Left;    Allergies  No Known Allergies  History of Present Illness    Carolyn Lynn is a 36 y.o. 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  She delivered a healthy baby 11/13/2015.  Presented to ED 10/10 on the 23 with shortness of breath and edema.  Echo with LVEF 40-25%.  Urine cultures positive for Klebsiella pneumonia.  Also concern for pneumonia.  Hospitalization complicated by bilateral large hematosalpinx. Profound anemia with Hb 4.9 requiring 5u PRBC. Repeat echo 03/20/16 LVEF 45-50%, gr1DD, bileaflet MVP and mild MR.   Underwent laparoscopy which was  converted to laparotomy and left salpingo-oophorectomy 05/03/16. At follow up 03/2019 noted to have stopped ACE inhibitor.   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 Seen 10/06/21 and started on Labetolol 100mg  BID for blood pressure and heart failure benefit..   She presents today for follow up. Works in . Walks and uses stationary bike at home. Reports no shortness of breath nor dyspnea on exertion.  She had slight shortness of breath with a cold a few weeks ago which was improved with albuterol.  Symptoms have since resolved.  Reports no chest pain, pressure, or tightness. No edema, orthopnea, PND. Reports no palpitations. She is checking BP intermittently at home with readings 100s-132.  Following a low-sodium, healthy diet.  EKGs/Labs/Other Studies Reviewed:   The following studies were reviewed today:  09/11/2021 echocardiogram 1. Left ventricular ejection fraction, by estimation, is 45 to 50%. The  left ventricle has mildly decreased function. The left ventricle  demonstrates global hypokinesis. Left ventricular diastolic parameters  were normal.   2. Right ventricular systolic function is normal. The right ventricular  size is normal. There is normal pulmonary artery systolic pressure. The  estimated right ventricular systolic pressure is 16.4 mmHg.   3. The mitral valve is myxomatous. Mild mitral valve regurgitation. No  evidence of mitral stenosis. There is mild late systolic prolapse of both  leaflets of  the mitral valve.   4. The aortic valve is tricuspid. Aortic valve regurgitation is not  visualized. No aortic stenosis is present.   5. The inferior vena cava is normal in size with greater than 50%  respiratory variability, suggesting right atrial pressure of 3 mmHg.  EKG:  EKG is not ordered today.   Recent  Labs: 10/01/2021: ALT 12; BUN 11; Creatinine, Ser 0.94; Hemoglobin 9.1; Platelets 209; Potassium 4.1; Sodium 138  Recent Lipid Panel No results found for: "CHOL", "TRIG", "HDL", "CHOLHDL", "VLDL", "LDLCALC", "LDLDIRECT"  Home Medications   Current Meds  Medication Sig   albuterol (VENTOLIN HFA) 108 (90 Base) MCG/ACT inhaler INHALE 2 INHALATIONS BY MOUTH EVERY 4 TO 6 HOURS   labetalol (NORMODYNE) 100 MG tablet Take 1 tablet (100 mg total) by mouth 2 (two) times daily.     Review of Systems      All other systems reviewed and are otherwise negative except as noted above.  Physical Exam    VS:  BP 126/88   Pulse 80   Ht 5\' 4"  (1.626 m)   Wt 164 lb (74.4 kg)   BMI 28.15 kg/m  , BMI Body mass index is 28.15 kg/m.  Wt Readings from Last 3 Encounters:  01/08/22 164 lb (74.4 kg)  10/09/21 161 lb (73 kg)  10/06/21 162 lb 4.8 oz (73.6 kg)    GEN: Well nourished, well developed, in no acute distress. HEENT: normal. Neck: Supple, no JVD, carotid bruits, or masses. Cardiac: RRR, no murmurs, rubs, or gallops. No clubbing, cyanosis, edema.  Radials/PT 2+ and equal bilaterally.  Respiratory:  Respirations regular and unlabored, clear to auscultation bilaterally. GI: Soft, nontender, nondistended. MS: No deformity or atrophy. Skin: Warm and dry, no rash. Neuro:  Strength and sensation are intact. Psych: Normal affect.  Assessment & Plan    Peripartum cardiomyopathy/HFrEF- Euvolemic and well compensated on exam. NYHA I. 08/2021 LVEF 45-50%.  Given persistently reduced LVEF high risk for recurrent issues during pregnancy.  Present GDMT includes labetalol.  No indication for loop diuretic at this time.  Could consider repeat echo in 1 year.  HTN- BP well controlled. Continue current antihypertensive regimen labetalol 100 mg twice daily. Discussed to monitor BP at home at least 2 hours after medications and sitting for 5-10 minutes. Update TSH for monitoring.   Dyspnea - During respiratory  infection a few weeks ago with resolved with over-the-counter medications and as needed albuterol.  We will refill her as needed albuterol as a courtesy though I have provided information to establish with primary care.  Family history of cardiovascular disease - Heart healthy diet and regular cardiovascular exercise encouraged.  Lipid panel/CMP today for screening.        Disposition: Follow up in 6 month(s) with 09/2021, MD or APP.  Signed, Chilton Si, NP 01/08/2022, 10:08 AM [facility]

## 2022-01-09 LAB — LIPID PANEL
Chol/HDL Ratio: 3.1 ratio (ref 0.0–4.4)
Cholesterol, Total: 150 mg/dL (ref 100–199)
HDL: 49 mg/dL (ref >39)
LDL Chol Calc (NIH): 85 mg/dL (ref 0–99)
Triglycerides: 83 mg/dL (ref 0–149)
VLDL Cholesterol Cal: 16 mg/dL (ref 5–40)

## 2022-01-09 LAB — COMPREHENSIVE METABOLIC PANEL
ALT: 10 IU/L (ref 0–32)
AST: 14 IU/L (ref 0–40)
Albumin/Globulin Ratio: 1.3 (ref 1.2–2.2)
Albumin: 4 g/dL (ref 3.9–4.9)
Alkaline Phosphatase: 47 IU/L (ref 44–121)
BUN/Creatinine Ratio: 13 (ref 9–23)
BUN: 11 mg/dL (ref 6–20)
Bilirubin Total: 0.3 mg/dL (ref 0.0–1.2)
CO2: 22 mmol/L (ref 20–29)
Calcium: 9 mg/dL (ref 8.7–10.2)
Chloride: 105 mmol/L (ref 96–106)
Creatinine, Ser: 0.83 mg/dL (ref 0.57–1.00)
Globulin, Total: 3.1 g/dL (ref 1.5–4.5)
Glucose: 88 mg/dL (ref 70–99)
Potassium: 3.8 mmol/L (ref 3.5–5.2)
Sodium: 140 mmol/L (ref 134–144)
Total Protein: 7.1 g/dL (ref 6.0–8.5)
eGFR: 94 mL/min/{1.73_m2} (ref >59)

## 2022-01-09 LAB — TSH: TSH: 1.47 u[IU]/mL (ref 0.450–4.500)

## 2022-01-17 ENCOUNTER — Encounter (HOSPITAL_BASED_OUTPATIENT_CLINIC_OR_DEPARTMENT_OTHER): Payer: Self-pay | Admitting: Family Medicine

## 2022-01-17 ENCOUNTER — Ambulatory Visit (INDEPENDENT_AMBULATORY_CARE_PROVIDER_SITE_OTHER): Payer: Managed Care, Other (non HMO) | Admitting: Family Medicine

## 2022-01-17 DIAGNOSIS — I10 Essential (primary) hypertension: Secondary | ICD-10-CM

## 2022-01-17 DIAGNOSIS — Z0000 Encounter for general adult medical examination without abnormal findings: Secondary | ICD-10-CM

## 2022-01-17 NOTE — Patient Instructions (Signed)
  Medication Instructions:  Your physician recommends that you continue on your current medications as directed. Please refer to the Current Medication list given to you today. --If you need a refill on any your medications before your next appointment, please call your pharmacy first. If no refills are authorized on file call the office.-- Lab Work: Your physician has recommended that you have lab work today: No If you have labs (blood work) drawn today and your tests are completely normal, you will receive your results via MyChart message OR a phone call from our staff.  Please ensure you check your voicemail in the event that you authorized detailed messages to be left on a delegated number. If you have any lab test that is abnormal or we need to change your treatment, we will call you to review the results.  Referrals/Procedures/Imaging: No  Follow-Up: Your next appointment:   Your physician recommends that you schedule a follow-up appointment in: 6-8 weeks with Dr. de Cuba.  You will receive a text message or e-mail with a link to a survey about your care and experience with us today! We would greatly appreciate your feedback!   Thanks for letting us be apart of your health journey!!  Primary Care and Sports Medicine   Dr. Raymond de Cuba   We encourage you to activate your patient portal called "MyChart".  Sign up information is provided on this After Visit Summary.  MyChart is used to connect with patients for Virtual Visits (Telemedicine).  Patients are able to view lab/test results, encounter notes, upcoming appointments, etc.  Non-urgent messages can be sent to your provider as well. To learn more about what you can do with MyChart, please visit --  [email or web address].    

## 2022-01-17 NOTE — Assessment & Plan Note (Signed)
Blood pressure borderline in office today no changes to medication regimen to be made today, can continue with labetalol as prescribed.  Recommend focusing on lifestyle modifications at this time including DASH diet Recommend intermittent monitoring of blood pressure at home.  Recommend following up with cardiology as scheduled

## 2022-01-17 NOTE — Progress Notes (Signed)
New Patient Office Visit  Subjective    Patient ID: Carolyn Lynn, female    DOB: [birth year]  Age: 36 y.o. MRN: [id number]  CC:  Chief Complaint  Patient presents with   New Patient (Initial Visit)    Pt here to establish new care    HPI LORMA HEATER presents to establish care Last PCP -unsure of name, last saw provider a few years ago  Recently established with gynecology office, had Pap smear completed.   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  She is currently managed on labetalol for her blood pressure.  Last appointment with cardiology was a couple weeks ago.  Denies any current concerns related to chest pain or headaches.  Patient is originally from [place], [place]. Patient works in Engineering ge[employer]. Outside of work, she enjoys relaxing, she is a self-described homebody.  Outpatient Encounter Medications as of 01/17/2022  Medication Sig   albuterol (VENTOLIN HFA) 108 (90 Base) MCG/ACT inhaler INHALE 2 INHALATIONS BY MOUTH EVERY 4 TO 6 HOURS   labetalol (NORMODYNE) 100 MG tablet Take 1 tablet (100 mg total) by mouth 2 (two) times daily.   No facility-administered encounter medications on file as of 01/17/2022.    Past Medical History:  Diagnosis Date   Anemia    Chronic combined systolic and diastolic heart failure (HCC) 03/29/2016   Chronic kidney disease    left kidney smaller than right   Essential hypertension 08/31/2021   History of blood transfusion 12/2015   Medical history non-contributory    Mild persistent asthma 12/31/2019    Past Surgical History:  Procedure Laterality Date   LAPAROSCOPY N/A 05/03/2016   Procedure: LAPAROSCOPY DIAGNOSTIC;  Surgeon: Adam Phenix, MD;  Location: [facility];  Service: Gynecology;  Laterality: N/A;   LAPAROTOMY N/A 05/03/2016   Procedure: LAPAROTOMY;  Surgeon: Adam Phenix, MD;  Location: [facility];  Service: Gynecology;  Laterality: N/A;   NO PAST SURGERIES      SALPINGOOPHORECTOMY Left 05/03/2016   Procedure: SALPINGO OOPHORECTOMY;  Surgeon: Adam Phenix, MD;  Location: [facility];  Service: Gynecology;  Laterality: Left;    Family History  Problem Relation Age of Onset   Hypertension Mother    Asthma Brother    Cancer Sister     Social History   Socioeconomic History   Marital status: Single    Spouse name: Not on file   Number of children: Not on file   Years of education: Not on file   Highest education level: Not on file  Occupational History   Not on file  Tobacco Use   Smoking status: Never   Smokeless tobacco: Never  Vaping Use   Vaping Use: Never used  Substance and Sexual Activity   Alcohol use: No   Drug use: No   Sexual activity: Not Currently    Partners: Male    Birth control/protection: None  Other Topics Concern   Not on file  Social History Narrative   Not on file   Social Determinants of Health   Financial Resource Strain: Not on file  Food Insecurity: Not on file  Transportation Needs: Not on file  Physical Activity: Not on file  Stress: Not on file  Social Connections: Not on file  Intimate Partner Violence: Not on file    Objective    BP (!) 135/92 (BP Location: Right Arm, Patient Position: Sitting, Cuff Size: Large)   Pulse 66  Temp 97.8 F (36.6 C) (Oral)   Ht 5\' 4"  (1.626 m)   Wt 159 lb 9.6 oz (72.4 kg)   SpO2 100%   BMI 27.40 kg/m   Physical Exam  36 year old female in no acute distress Cardiovascular exam with regular rate and rhythm, no murmur appreciated Lungs clear to auscultation bilaterally  Assessment & Plan:   Problem List Items Addressed This Visit       Cardiovascular and Mediastinum   Essential hypertension    Blood pressure borderline in office today no changes to medication regimen to be made today, can continue with labetalol as prescribed.  Recommend focusing on lifestyle modifications at this time including DASH diet Recommend intermittent monitoring of blood  pressure at home.  Recommend following up with cardiology as scheduled      Other Visit Diagnoses     Wellness examination       Relevant Orders   CBC with Differential/Platelet   Hemoglobin A1c   Comprehensive metabolic panel   Lipid panel   TSH Rfx on Abnormal to Free T4       Return in about 6 months (around 07/19/2022) for CPE with [facility] a few days prior.   Opel Lejeune J De 09/18/2022, MD

## 2022-02-20 ENCOUNTER — Ambulatory Visit (HOSPITAL_BASED_OUTPATIENT_CLINIC_OR_DEPARTMENT_OTHER): Payer: Managed Care, Other (non HMO) | Admitting: Family Medicine

## 2022-04-08 ENCOUNTER — Other Ambulatory Visit (HOSPITAL_BASED_OUTPATIENT_CLINIC_OR_DEPARTMENT_OTHER): Payer: Self-pay | Admitting: Family

## 2022-04-08 DIAGNOSIS — R0609 Other forms of dyspnea: Secondary | ICD-10-CM

## 2022-07-10 ENCOUNTER — Encounter (HOSPITAL_BASED_OUTPATIENT_CLINIC_OR_DEPARTMENT_OTHER): Payer: Self-pay

## 2022-07-10 ENCOUNTER — Ambulatory Visit (HOSPITAL_BASED_OUTPATIENT_CLINIC_OR_DEPARTMENT_OTHER): Payer: Managed Care, Other (non HMO)

## 2022-07-13 ENCOUNTER — Ambulatory Visit (HOSPITAL_BASED_OUTPATIENT_CLINIC_OR_DEPARTMENT_OTHER): Payer: Managed Care, Other (non HMO) | Admitting: Cardiovascular Disease

## 2022-07-19 ENCOUNTER — Encounter (HOSPITAL_BASED_OUTPATIENT_CLINIC_OR_DEPARTMENT_OTHER): Payer: Managed Care, Other (non HMO) | Admitting: Family Medicine

## 2022-07-25 ENCOUNTER — Ambulatory Visit (INDEPENDENT_AMBULATORY_CARE_PROVIDER_SITE_OTHER): Payer: Medicaid Other | Admitting: Family Medicine

## 2022-07-25 ENCOUNTER — Encounter (HOSPITAL_BASED_OUTPATIENT_CLINIC_OR_DEPARTMENT_OTHER): Payer: Self-pay | Admitting: Family Medicine

## 2022-07-25 VITALS — BP 133/97 | HR 70 | Temp 97.7°F | Ht 64.0 in | Wt 162.0 lb

## 2022-07-25 DIAGNOSIS — I10 Essential (primary) hypertension: Secondary | ICD-10-CM

## 2022-07-25 DIAGNOSIS — Z0000 Encounter for general adult medical examination without abnormal findings: Secondary | ICD-10-CM

## 2022-07-25 NOTE — Assessment & Plan Note (Signed)
Routine HCM labs ordered. HCM reviewed/discussed. Anticipatory guidance regarding healthy weight, lifestyle and choices given. Recommend healthy diet.  Recommend approximately 150 minutes/week of moderate intensity exercise Recommend regular dental and vision exams Always use seatbelt/lap and shoulder restraints Recommend using smoke alarms and checking batteries at least twice a year Recommend using sunscreen when outside Discussed tetanus immunization recommendations, patient unsure if UTD

## 2022-07-25 NOTE — Progress Notes (Signed)
Subjective:    CC: Annual Physical Exam  HPI:  Carolyn Lynn is a 37 y.o. presenting for annual physical  I reviewed the past medical history, family history, social history, surgical history, and allergies today and no changes were needed.  Please see the problem list section below in epic for further details.  Past Medical History: Past Medical History:  Diagnosis Date   Anemia    Chronic combined systolic and diastolic heart failure (HCC) 03/29/2016   Chronic kidney disease    left kidney smaller than right   Essential hypertension 08/31/2021   History of blood transfusion 12/2015   Medical history non-contributory    Mild persistent asthma 12/31/2019   Past Surgical History: Past Surgical History:  Procedure Laterality Date   LAPAROSCOPY N/A 05/03/2016   Procedure: LAPAROSCOPY DIAGNOSTIC;  Surgeon: Adam Phenix, MD;  Location: [facility];  Service: Gynecology;  Laterality: N/A;   LAPAROTOMY N/A 05/03/2016   Procedure: LAPAROTOMY;  Surgeon: Adam Phenix, MD;  Location: [facility];  Service: Gynecology;  Laterality: N/A;   NO PAST SURGERIES     SALPINGOOPHORECTOMY Left 05/03/2016   Procedure: SALPINGO OOPHORECTOMY;  Surgeon: Adam Phenix, MD;  Location: [facility];  Service: Gynecology;  Laterality: Left;   Social History: Social History   Socioeconomic History   Marital status: Single    Spouse name: Not on file   Number of children: Not on file   Years of education: Not on file   Highest education level: Not on file  Occupational History   Not on file  Tobacco Use   Smoking status: Never   Smokeless tobacco: Never  Vaping Use   Vaping Use: Never used  Substance and Sexual Activity   Alcohol use: No   Drug use: No   Sexual activity: Not Currently    Partners: Male    Birth control/protection: None  Other Topics Concern   Not on file  Social History Narrative   Not on file   Social Determinants of Health   Financial Resource Strain: Not on file  Food  Insecurity: Not on file  Transportation Needs: Not on file  Physical Activity: Not on file  Stress: Not on file  Social Connections: Not on file   Family History: Family History  Problem Relation Age of Onset   Hypertension Mother    Asthma Brother    Cancer Sister    Allergies: No Known Allergies Medications: See med rec.  Review of Systems: No headache, visual changes, nausea, vomiting, diarrhea, constipation, dizziness, abdominal pain, skin rash, fevers, chills, night sweats, swollen lymph nodes, weight loss, chest pain, body aches, joint swelling, muscle aches, shortness of breath, mood changes, visual or auditory hallucinations.  Objective:    BP (!) 133/97 (BP Location: Right Arm, Patient Position: Sitting, Cuff Size: Normal)   Pulse 70   Temp 97.7 F (36.5 C) (Oral)   Ht 5\' 4"  (1.626 m)   Wt 162 lb (73.5 kg)   SpO2 100%   BMI 27.81 kg/m   General: Well Developed, well nourished, and in no acute distress. Neuro: Alert and oriented x3, extra-ocular muscles intact, sensation grossly intact. Cranial nerves II through XII are intact, motor, sensory, and coordinative functions are all intact. HEENT: Normocephalic, atraumatic, pupils equal round reactive to light, neck supple, no masses, no lymphadenopathy, thyroid nonpalpable. Oropharynx, nasopharynx, external ear canals are unremarkable. Skin: Warm and dry, no rashes noted.  Cardiac: Regular rate and rhythm, no murmurs rubs or gallops. Respiratory: Clear  to auscultation bilaterally. Not using accessory muscles, speaking in full sentences. Abdominal: Soft, nontender, nondistended, positive bowel sounds, no masses, no organomegaly. Musculoskeletal: Shoulder, elbow, wrist, hip, knee, ankle stable, and with full range of motion.  Impression and Recommendations:    Wellness examination Routine HCM labs ordered. HCM reviewed/discussed. Anticipatory guidance regarding healthy weight, lifestyle and choices given. Recommend  healthy diet.  Recommend approximately 150 minutes/week of moderate intensity exercise Recommend regular dental and vision exams Always use seatbelt/lap and shoulder restraints Recommend using smoke alarms and checking batteries at least twice a year Recommend using sunscreen when outside Discussed tetanus immunization recommendations, patient unsure if UTD  Return in about 1 year (around 07/25/2023) for CPE.   ___________________________________________ Lillion Elbert de [place], MD, ABFM, CAQSM Primary Care and Sports Medicine [facility]

## 2022-07-25 NOTE — Patient Instructions (Signed)
  Medication Instructions:  Your physician recommends that you continue on your current medications as directed. Please refer to the Current Medication list given to you today. --If you need a refill on any your medications before your next appointment, please call your pharmacy first. If no refills are authorized on file call the office.-- Lab Work: Your physician has recommended that you have lab work today: No If you have labs (blood work) drawn today and your tests are completely normal, you will receive your results via MyChart message OR a phone call from our staff.  Please ensure you check your voicemail in the event that you authorized detailed messages to be left on a delegated number. If you have any lab test that is abnormal or we need to change your treatment, we will call you to review the results.  Referrals/Procedures/Imaging: No  Follow-Up: Your next appointment:   Your physician recommends that you schedule a follow-up appointment in: 1 year with Dr. de [place].  You will receive a text message or e-mail with a link to a survey about your care and experience with us today! We would greatly appreciate your feedback!   Thanks for letting us be apart of your health journey!!  Primary Care and Sports Medicine   Dr. Raymond de Cuba   We encourage you to activate your patient portal called "MyChart".  Sign up information is provided on this After Visit Summary.  MyChart is used to connect with patients for Virtual Visits (Telemedicine).  Patients are able to view lab/test results, encounter notes, upcoming appointments, etc.  Non-urgent messages can be sent to your provider as well. To learn more about what you can do with MyChart, please visit --  [email or web address].    

## 2022-08-06 ENCOUNTER — Other Ambulatory Visit (HOSPITAL_BASED_OUTPATIENT_CLINIC_OR_DEPARTMENT_OTHER): Payer: Managed Care, Other (non HMO)

## 2022-08-27 ENCOUNTER — Ambulatory Visit (HOSPITAL_BASED_OUTPATIENT_CLINIC_OR_DEPARTMENT_OTHER): Payer: Self-pay | Admitting: Family

## 2023-09-09 ENCOUNTER — Encounter (HOSPITAL_BASED_OUTPATIENT_CLINIC_OR_DEPARTMENT_OTHER): Payer: Self-pay | Admitting: *Deleted

## 2023-11-25 ENCOUNTER — Encounter (HOSPITAL_BASED_OUTPATIENT_CLINIC_OR_DEPARTMENT_OTHER): Payer: Self-pay | Admitting: Family Medicine

## 2023-11-25 ENCOUNTER — Other Ambulatory Visit (HOSPITAL_BASED_OUTPATIENT_CLINIC_OR_DEPARTMENT_OTHER): Payer: Self-pay | Admitting: *Deleted

## 2023-11-25 DIAGNOSIS — Z124 Encounter for screening for malignant neoplasm of cervix: Secondary | ICD-10-CM
